# Patient Record
Sex: Female | Born: 1980 | Race: White | Hispanic: No | Marital: Single | Smoking: Former smoker
Health system: Southern US, Community
[De-identification: ages and names within clinical notes are randomized; demographics above are authoritative.]

## PROBLEM LIST (undated history)

## (undated) DIAGNOSIS — F419 Anxiety disorder, unspecified: Secondary | ICD-10-CM

## (undated) DIAGNOSIS — D649 Anemia, unspecified: Secondary | ICD-10-CM

## (undated) DIAGNOSIS — F32A Depression, unspecified: Secondary | ICD-10-CM

## (undated) DIAGNOSIS — F3181 Bipolar II disorder: Secondary | ICD-10-CM

## (undated) DIAGNOSIS — C801 Malignant (primary) neoplasm, unspecified: Secondary | ICD-10-CM

## (undated) DIAGNOSIS — R569 Unspecified convulsions: Secondary | ICD-10-CM

## (undated) DIAGNOSIS — F329 Major depressive disorder, single episode, unspecified: Secondary | ICD-10-CM

## (undated) DIAGNOSIS — Z8489 Family history of other specified conditions: Secondary | ICD-10-CM

## (undated) HISTORY — DX: Bipolar II disorder: F31.81

## (undated) HISTORY — PX: ARM WOUND REPAIR / CLOSURE: SUR1141

## (undated) HISTORY — DX: Major depressive disorder, single episode, unspecified: F32.9

## (undated) HISTORY — DX: Depression, unspecified: F32.A

## (undated) HISTORY — DX: Anxiety disorder, unspecified: F41.9

---

## 2006-02-27 ENCOUNTER — Emergency Department (HOSPITAL_COMMUNITY): Admission: EM | Admit: 2006-02-27 | Discharge: 2006-02-27 | Payer: Self-pay | Admitting: Emergency Medicine

## 2008-08-14 ENCOUNTER — Emergency Department (HOSPITAL_COMMUNITY): Admission: EM | Admit: 2008-08-14 | Discharge: 2008-08-14 | Payer: Self-pay | Admitting: Emergency Medicine

## 2010-03-21 ENCOUNTER — Encounter: Payer: Self-pay | Admitting: Emergency Medicine

## 2011-06-25 ENCOUNTER — Ambulatory Visit (INDEPENDENT_AMBULATORY_CARE_PROVIDER_SITE_OTHER): Payer: BC Managed Care – PPO | Admitting: Internal Medicine

## 2011-06-25 VITALS — BP 119/78 | HR 68 | Temp 98.5°F | Resp 18 | Ht 69.0 in | Wt 139.2 lb

## 2011-06-25 DIAGNOSIS — T783XXA Angioneurotic edema, initial encounter: Secondary | ICD-10-CM

## 2011-06-25 DIAGNOSIS — F419 Anxiety disorder, unspecified: Secondary | ICD-10-CM

## 2011-06-25 DIAGNOSIS — L509 Urticaria, unspecified: Secondary | ICD-10-CM

## 2011-06-25 DIAGNOSIS — F411 Generalized anxiety disorder: Secondary | ICD-10-CM

## 2011-06-25 MED ORDER — ALPRAZOLAM 0.5 MG PO TBDP: 0.5000 mg | ORAL_TABLET | Freq: Two times a day (BID) | ORAL | Status: AC | PRN

## 2011-06-25 MED ORDER — DIPHENHYDRAMINE HCL 25 MG PO CAPS
50.0000 mg | ORAL_CAPSULE | Freq: Once | ORAL | Status: AC
  Administered 2011-06-25: 50 mg via ORAL

## 2011-06-25 MED ORDER — CETIRIZINE HCL 10 MG PO TABS: 10.0000 mg | ORAL_TABLET | Freq: Every day | ORAL | Status: DC

## 2011-06-25 MED ORDER — METHYLPREDNISOLONE ACETATE 80 MG/ML IJ SUSP
80.0000 mg | Freq: Once | INTRAMUSCULAR | Status: AC
  Administered 2011-06-25: 80 mg via INTRAMUSCULAR

## 2011-06-25 NOTE — Patient Instructions (Signed)
Hives Hives (urticaria) are itchy, red, swollen patches on the skin. They may change size, shape, and location quickly and repeatedly. Hives that occur deeper in the skin can cause swelling of the hands, feet, and face. Hives may be an allergic reaction to something you or your child ate, touched, or put on the skin. Hives can also be a reaction to cold, heat, viral infections, medication, insect bites, or emotional stress. Often the cause is hard to find. Hives can come and go for several days to several weeks. Hives are not contagious. HOME CARE INSTRUCTIONS   If the cause of the hives is known, avoid exposure to that source.   To relieve itching and rash:   Apply cold compresses to the skin or take cool water baths. Do not take or give your child hot baths or showers because the warmth will make the itching worse.   The best medicine for hives is an antihistamine. An antihistamine will not cure hives, but it will reduce their severity. You can use an antihistamine available over the counter. This medicine may make your child sleepy. Teenagers should not drive while using this medicine.   Take or give an antihistamine every 6 hours until the hives are completely gone for 24 hours or as directed.   Your child may have other medications prescribed for itching. Give these as directed by your child's caregiver.   You or your child should wear loose fitting clothing, including undergarments. Skin irritations may make hives worse.   Follow-up as directed by your caregiver.  SEEK MEDICAL CARE IF:   You or your child still have considerable itching after taking the medication (prescribed or purchased over the counter).   Joint swelling or pain occurs.  SEEK IMMEDIATE MEDICAL CARE IF:   You have a fever.   Swollen lips or tongue are noticed.   There is difficulty with breathing, swallowing, or tightness in the throat or chest.   Abdominal pain develops.   Your child starts acting very  sick.  These may be the first signs of a life-threatening allergic reaction. THIS IS AN EMERGENCY. Call 911 for medical help. MAKE SURE YOU:   Understand these instructions.   Will watch your condition.   Will get help right away if you are not doing well or get worse.  Document Released: 02/13/2005 Document Revised: 02/02/2011 Document Reviewed: 10/04/2007 Exit[employer] Patient Information 2012 [employer]. 

## 2011-06-25 NOTE — Progress Notes (Signed)
  Subjective:    Patient ID: Haley Evans, female    DOB: 02/16/1981, 31 y.o.   MRN: [id number]  HPI Seen emergently. Has swelling of her left eyelid with itching and sneezing. Has chest titeness but probably her anxiety. No past hx of allergys, or asthma. No exposure hx.  Has had scatered iregular breakouts with focal hives for last 2 weeks all over and small areas only . No swelling of tongue, throat or airway   Review of Systems     Objective:   Physical Exam Swollen left eyelid Lungs clear  Heart RR 68 No hives anywhere else       Assessment & Plan:  Allergy and possible angioedema and hives  Benedryl 50mg  now Depomedrol 80mg  IM now  See meds

## 2011-12-11 ENCOUNTER — Other Ambulatory Visit: Payer: Self-pay | Admitting: Internal Medicine

## 2011-12-12 ENCOUNTER — Other Ambulatory Visit: Payer: Self-pay | Admitting: *Deleted

## 2012-03-02 ENCOUNTER — Other Ambulatory Visit: Payer: Self-pay | Admitting: Physician Assistant

## 2012-06-21 ENCOUNTER — Other Ambulatory Visit: Payer: Self-pay | Admitting: Physician Assistant

## 2012-06-25 ENCOUNTER — Telehealth: Payer: Self-pay

## 2012-06-25 NOTE — Telephone Encounter (Signed)
Pharm requests RF of alprazolam 0.5 mg BID prn anxiety.

## 2012-06-27 NOTE — Telephone Encounter (Signed)
Needs to establish with primary care

## 2014-06-03 ENCOUNTER — Ambulatory Visit (INDEPENDENT_AMBULATORY_CARE_PROVIDER_SITE_OTHER): Payer: BLUE CROSS/BLUE SHIELD | Admitting: Neurology

## 2014-06-03 ENCOUNTER — Encounter: Payer: Self-pay | Admitting: Neurology

## 2014-06-03 VITALS — BP 120/70 | HR 84 | Ht 69.0 in | Wt 154.0 lb

## 2014-06-03 DIAGNOSIS — R251 Tremor, unspecified: Secondary | ICD-10-CM | POA: Diagnosis not present

## 2014-06-03 DIAGNOSIS — R292 Abnormal reflex: Secondary | ICD-10-CM

## 2014-06-03 DIAGNOSIS — R5383 Other fatigue: Secondary | ICD-10-CM | POA: Diagnosis not present

## 2014-06-03 LAB — CBC WITH DIFFERENTIAL/PLATELET
BASOS ABS: 0 10*3/uL (ref 0.0–0.1)
Basophils Relative: 0 % (ref 0–1)
Eosinophils Absolute: 0.1 10*3/uL (ref 0.0–0.7)
Eosinophils Relative: 1 % (ref 0–5)
HCT: 42 % (ref 36.0–46.0)
Hemoglobin: 14.1 g/dL (ref 12.0–15.0)
LYMPHS PCT: 27 % (ref 12–46)
Lymphs Abs: 2.4 10*3/uL (ref 0.7–4.0)
MCH: 30.5 pg (ref 26.0–34.0)
MCHC: 33.6 g/dL (ref 30.0–36.0)
MCV: 90.7 fL (ref 78.0–100.0)
MPV: 10.1 fL (ref 8.6–12.4)
Monocytes Absolute: 0.9 10*3/uL (ref 0.1–1.0)
Monocytes Relative: 10 % (ref 3–12)
NEUTROS ABS: 5.6 10*3/uL (ref 1.7–7.7)
Neutrophils Relative %: 62 % (ref 43–77)
PLATELETS: 320 10*3/uL (ref 150–400)
RBC: 4.63 MIL/uL (ref 3.87–5.11)
RDW: 12.8 % (ref 11.5–15.5)
WBC: 9 10*3/uL (ref 4.0–10.5)

## 2014-06-03 LAB — COMPREHENSIVE METABOLIC PANEL
ALBUMIN: 4.4 g/dL (ref 3.5–5.2)
ALT: 20 U/L (ref 0–35)
AST: 18 U/L (ref 0–37)
Alkaline Phosphatase: 51 U/L (ref 39–117)
BUN: 16 mg/dL (ref 6–23)
CALCIUM: 9.3 mg/dL (ref 8.4–10.5)
CHLORIDE: 101 meq/L (ref 96–112)
CO2: 26 meq/L (ref 19–32)
Creat: 0.82 mg/dL (ref 0.50–1.10)
Glucose, Bld: 82 mg/dL (ref 70–99)
POTASSIUM: 4.3 meq/L (ref 3.5–5.3)
Sodium: 137 mEq/L (ref 135–145)
TOTAL PROTEIN: 7.2 g/dL (ref 6.0–8.3)
Total Bilirubin: 0.5 mg/dL (ref 0.2–1.2)

## 2014-06-03 LAB — TSH: TSH: 1.526 u[IU]/mL (ref 0.350–4.500)

## 2014-06-03 LAB — VITAMIN B12: Vitamin B-12: 571 pg/mL (ref 211–911)
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
## 2014-06-03 NOTE — Patient Instructions (Signed)
1. Your provider has requested that you have labwork completed today. Please go to [facility] on the first floor of this building before leaving the office today. 2. We have scheduled you at [facility] for your MRI on 06/19/2014 at 9:00 am. Please arrive 15 minutes prior and go to 1st floor radiology. If you need to reschedule for any reason please call [phone].

## 2014-06-04 NOTE — Progress Notes (Signed)
Note routed to Thurston Pounds, NP.

## 2014-06-05 LAB — CERULOPLASMIN: Ceruloplasmin: 23 mg/dL (ref 18–53)

## 2014-06-05 LAB — COPPER, SERUM: COPPER: 86 ug/dL (ref 70–175)

## 2014-06-09 ENCOUNTER — Ambulatory Visit: Payer: Self-pay | Admitting: Neurology

## 2014-06-17 ENCOUNTER — Telehealth: Payer: Self-pay | Admitting: *Deleted

## 2014-06-17 NOTE — Telephone Encounter (Signed)
Patient calling because her insurance denied her MRI please advise   Call [phone] number [phone]

## 2014-06-18 NOTE — Telephone Encounter (Signed)
Left message on machine for patient to call back.

## 2014-06-19 ENCOUNTER — Ambulatory Visit (HOSPITAL_COMMUNITY): Payer: BLUE CROSS/BLUE SHIELD

## 2018-01-16 ENCOUNTER — Emergency Department (HOSPITAL_BASED_OUTPATIENT_CLINIC_OR_DEPARTMENT_OTHER): Payer: BLUE CROSS/BLUE SHIELD

## 2018-01-16 ENCOUNTER — Emergency Department (HOSPITAL_BASED_OUTPATIENT_CLINIC_OR_DEPARTMENT_OTHER)
Admission: EM | Admit: 2018-01-16 | Discharge: 2018-01-16 | Disposition: A | Discharge status: Home / Self Care | Payer: BLUE CROSS/BLUE SHIELD | Attending: Emergency Medicine | Admitting: Emergency Medicine

## 2018-01-16 ENCOUNTER — Encounter: Payer: Self-pay | Admitting: Neurology

## 2018-01-16 ENCOUNTER — Other Ambulatory Visit: Payer: Self-pay

## 2018-01-16 ENCOUNTER — Encounter (HOSPITAL_BASED_OUTPATIENT_CLINIC_OR_DEPARTMENT_OTHER): Payer: Self-pay

## 2018-01-16 DIAGNOSIS — F17210 Nicotine dependence, cigarettes, uncomplicated: Secondary | ICD-10-CM | POA: Insufficient documentation

## 2018-01-16 DIAGNOSIS — Z79899 Other long term (current) drug therapy: Secondary | ICD-10-CM | POA: Insufficient documentation

## 2018-01-16 DIAGNOSIS — R51 Headache: Secondary | ICD-10-CM | POA: Diagnosis not present

## 2018-01-16 DIAGNOSIS — H53149 Visual discomfort, unspecified: Secondary | ICD-10-CM | POA: Insufficient documentation

## 2018-01-16 DIAGNOSIS — R42 Dizziness and giddiness: Secondary | ICD-10-CM | POA: Insufficient documentation

## 2018-01-16 DIAGNOSIS — M79602 Pain in left arm: Secondary | ICD-10-CM | POA: Diagnosis not present

## 2018-01-16 DIAGNOSIS — R569 Unspecified convulsions: Secondary | ICD-10-CM

## 2018-01-16 DIAGNOSIS — R55 Syncope and collapse: Secondary | ICD-10-CM | POA: Diagnosis present

## 2018-01-16 LAB — CBC WITH DIFFERENTIAL/PLATELET
ABS IMMATURE GRANULOCYTES: 0.02 10*3/uL (ref 0.00–0.07)
BASOS PCT: 1 %
Basophils Absolute: 0.1 10*3/uL (ref 0.0–0.1)
Eosinophils Absolute: 0.1 10*3/uL (ref 0.0–0.5)
Eosinophils Relative: 1 %
HCT: 42.8 % (ref 36.0–46.0)
HEMOGLOBIN: 13.8 g/dL (ref 12.0–15.0)
Immature Granulocytes: 0 %
LYMPHS PCT: 24 %
Lymphs Abs: 2.1 10*3/uL (ref 0.7–4.0)
MCH: 30.4 pg (ref 26.0–34.0)
MCHC: 32.2 g/dL (ref 30.0–36.0)
MCV: 94.3 fL (ref 80.0–100.0)
MONO ABS: 0.7 10*3/uL (ref 0.1–1.0)
MONOS PCT: 9 %
NEUTROS ABS: 5.7 10*3/uL (ref 1.7–7.7)
Neutrophils Relative %: 65 %
Platelets: 281 10*3/uL (ref 150–400)
RBC: 4.54 MIL/uL (ref 3.87–5.11)
RDW: 12 % (ref 11.5–15.5)
WBC: 8.6 10*3/uL (ref 4.0–10.5)
nRBC: 0 % (ref 0.0–0.2)

## 2018-01-16 LAB — BASIC METABOLIC PANEL
Anion gap: 8 (ref 5–15)
BUN: 9 mg/dL (ref 6–20)
CHLORIDE: 108 mmol/L (ref 98–111)
CO2: 23 mmol/L (ref 22–32)
CREATININE: 0.77 mg/dL (ref 0.44–1.00)
Calcium: 8.8 mg/dL — ABNORMAL LOW (ref 8.9–10.3)
GFR calc Af Amer: >60 mL/min (ref >60)
GFR calc non Af Amer: >60 mL/min (ref >60)
GLUCOSE: 103 mg/dL — AB (ref 70–99)
POTASSIUM: 3.3 mmol/L — AB (ref 3.5–5.1)
SODIUM: 139 mmol/L (ref 135–145)

## 2018-01-16 MED ORDER — PROCHLORPERAZINE EDISYLATE 10 MG/2ML IJ SOLN
10.0000 mg | Freq: Once | INTRAMUSCULAR | Status: AC
  Administered 2018-01-16: 10 mg via INTRAVENOUS
  Filled 2018-01-16: qty 2

## 2018-01-16 MED ORDER — DEXAMETHASONE 6 MG PO TABS
10.0000 mg | ORAL_TABLET | Freq: Once | ORAL | Status: AC
  Administered 2018-01-16: 10 mg via ORAL
  Filled 2018-01-16: qty 1

## 2018-01-16 MED ORDER — SODIUM CHLORIDE 0.9 % IV BOLUS
1000.0000 mL | Freq: Once | INTRAVENOUS | Status: AC
  Administered 2018-01-16: 1000 mL via INTRAVENOUS

## 2018-01-16 MED ORDER — DIPHENHYDRAMINE HCL 50 MG/ML IJ SOLN
25.0000 mg | Freq: Once | INTRAMUSCULAR | Status: AC
  Administered 2018-01-16: 25 mg via INTRAVENOUS
  Filled 2018-01-16: qty 1

## 2018-01-16 NOTE — ED Notes (Signed)
Patient transported to CT 

## 2018-01-16 NOTE — ED Provider Notes (Signed)
[facility] EMERGENCY DEPARTMENT Provider Note   CSN: [id number] Arrival date & time: 01/16/18  1130     History   Chief Complaint Chief Complaint  Patient presents with  . Loss of Consciousness    HPI Haley Evans is a 37 y.o. female.  37 yo F with a chief complaint of a syncopal event versus a seizure.  This happened 2 days ago.  Patient was making dinner she was putting bread on a griddle and she suddenly felt hot like she might pass out and then she woke up on the ground.  She was confused about what exactly happened and had lost control of her bladder.  Her boyfriend was there with her and caught her before she fell.  He said that she had some shaking.  She does not how long this occurred.  No history of seizures she denies head injury.  She is now complaining of headache that worse the top of her head, with photophobia.  No histories of headaches like this before.  She has been having some left arm pain as well.  She denies neck pain.  Denies trauma to the arm.  Denies unilateral weakness or numbness denies difficulty with speech or swallowing.  She has been somewhat confused for the past couple days.  Usually worse with bright lights.  She has had a couple more episodes where she felt she might pass out but resolved prior to her losing consciousness.  The history is provided by the patient.  Loss of Consciousness   This is a new problem. The current episode started 2 days ago. The problem occurs rarely. The problem has been resolved. She lost consciousness for a period of less than one minute. The problem is associated with normal activity. Associated symptoms include dizziness, headaches and nausea. Pertinent negatives include chest pain, congestion, fever, palpitations, visual change and vomiting.    Past Medical History:  Diagnosis Date  . Anxiety   . Depression     Patient Active Problem List   Diagnosis Date Noted  . Anxiety 06/25/2011    Past  Surgical History:  Procedure Laterality Date  . ARM WOUND REPAIR / CLOSURE Right    fractured arm  . CESAREAN SECTION       OB History   None      Home Medications    Prior to Admission medications   Medication Sig Start Date End Date Taking? Authorizing Provider  ALPRAZolam Duanne Moron) 0.5 MG tablet TAKE 1 TABLET BY MOUTH TWICE DAILY AS NEEDED FOR ANXIETY 03/02/12   Argentina Donovan, PA-C  Ascorbic Acid (VITAMIN C PO) Take by mouth daily.    [provider]  B Complex-C (SUPER B COMPLEX PO) Take by mouth daily.    [provider]  BIOTIN PO Take by mouth daily.    [provider]  buPROPion (WELLBUTRIN SR) 150 MG 12 hr tablet Take 150 mg by mouth 2 (two) times daily.    [provider]  Cholecalciferol (VITAMIN D PO) Take by mouth daily.    [provider]  CINNAMON PO Take by mouth daily.    [provider]  COCONUT OIL PO Take by mouth daily.    [provider]  cyclobenzaprine (FLEXERIL) 10 MG tablet Take 10 mg by mouth as needed for muscle spasms.    [provider]  escitalopram (LEXAPRO) 20 MG tablet Take 20 mg by mouth daily.    [provider]  Multiple Vitamin (MULTIVITAMIN) tablet  Take 1 tablet by mouth daily.    [provider]  VITAMIN E PO Take by mouth daily.    [provider]    Family History No family history on file.  Social History Social History   Tobacco Use  . Smoking status: Current Every Day Smoker    Types: Cigarettes  . Smokeless tobacco: Never Used  Substance Use Topics  . Alcohol use: Yes    Comment: weekly  . Drug use: No     Allergies   Morphine   Review of Systems Review of Systems  Constitutional: Positive for fatigue. Negative for chills and fever.  HENT: Negative for congestion and rhinorrhea.   Eyes: Negative for redness and visual disturbance.  Respiratory: Negative for shortness of breath and wheezing.   Cardiovascular: Positive  for syncope. Negative for chest pain and palpitations.  Gastrointestinal: Positive for nausea. Negative for vomiting.  Genitourinary: Negative for dysuria and urgency.  Musculoskeletal: Positive for arthralgias and myalgias.  Skin: Negative for pallor and wound.  Neurological: Positive for dizziness, syncope and headaches.       Confusion      Physical Exam Updated Vital Signs BP (!) 139/99 (BP Location: Right Arm)   Pulse 86   Temp 98.3 F (36.8 C) (Oral)   Resp 20   Ht 5\' 10"  (1.778 m)   Wt 73.5 kg   SpO2 100%   BMI 23.24 kg/m   Physical Exam  Constitutional: She is oriented to person, place, and time. She appears well-developed and well-nourished. No distress.  HENT:  Head: Normocephalic and atraumatic.  Eyes: Pupils are equal, round, and reactive to light. EOM are normal.  Neck: Normal range of motion. Neck supple.  Cardiovascular: Normal rate and regular rhythm. Exam reveals no gallop and no friction rub.  No murmur heard. Pulmonary/Chest: Effort normal. She has no wheezes. She has no rales.  Abdominal: Soft. She exhibits no distension. There is no tenderness.  Musculoskeletal: She exhibits no edema or tenderness.  Neurological: She is alert and oriented to person, place, and time. She has normal strength. No cranial nerve deficit or sensory deficit. She displays a negative Romberg sign. Coordination and gait normal. GCS eye subscore is 4. GCS verbal subscore is 5. GCS motor subscore is 6. She displays no Babinski's sign on the right side. She displays no Babinski's sign on the left side.  Reflex Scores:      Tricep reflexes are 2+ on the right side and 2+ on the left side.      Bicep reflexes are 2+ on the right side and 2+ on the left side.      Brachioradialis reflexes are 2+ on the right side and 2+ on the left side.      Patellar reflexes are 2+ on the right side and 2+ on the left side.      Achilles reflexes are 2+ on the right side and 2+ on the left  side. Benign neurologic exam  Skin: Skin is warm and dry. She is not diaphoretic.  Psychiatric: She has a normal mood and affect. Her behavior is normal.  Nursing note and vitals reviewed.    ED Treatments / Results  Labs (all labs ordered are listed, but only abnormal results are displayed) Labs Reviewed  BASIC METABOLIC PANEL - Abnormal; Notable for the following components:      Result Value   Potassium 3.3 (*)    Glucose, Bld 103 (*)    Calcium 8.8 (*)  All other components within normal limits  CBC WITH DIFFERENTIAL/PLATELET  PREGNANCY, URINE    EKG EKG Interpretation  Date/Time:  Wednesday January 16 2018 11:39:42 EST Ventricular Rate:  86 PR Interval:  142 QRS Duration: 82 QT Interval:  352 QTC Calculation: 421 R Axis:   64 Text Interpretation:  Normal sinus rhythm with sinus arrhythmia Normal ECG No old tracing to compare no wpw, prolonged qt or brugada Confirmed by Deno Etienne [phone]) on 01/16/2018 11:46:56 AM   Radiology Ct Head Wo Contrast  Result Date: 01/16/2018 CLINICAL DATA:  Possible seizure 2 days ago, reported loss of consciousness. Headache. Normal neurologic exam. EXAM: CT HEAD WITHOUT CONTRAST TECHNIQUE: Contiguous axial images were obtained from the base of the skull through the vertex without intravenous contrast. COMPARISON:  CT head 07/08/2005. 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 Compared with the previous CT scan 12 years ago however, the overall AP dimension of the CSF collection is measurably unchanged and is therefore of uncertain significance with respect to headache and seizures. Otherwise, no acute stroke, hemorrhage, mass lesion, or hydrocephalus. Normal cerebral volume. No white disease. Within limits for assessment on CT, normal  appearing temporal lobes. Vascular: No hyperdense vessel or unexpected calcification. Skull: Normal. Negative for fracture or focal lesion. Sinuses/Orbits: No acute finding. Other: None. IMPRESSION: Prominent retrocerebellar cisterna magna, unchanged from 2007. Otherwise negative study. No acute intracranial findings. Electronically Signed   By: Staci Righter M.D.   On: 01/16/2018 12:48    Procedures Procedures (including critical care time)  Medications Ordered in ED Medications  sodium chloride 0.9 % bolus 1,000 mL (1,000 mLs Intravenous New Bag/Given 01/16/18 1239)  prochlorperazine (COMPAZINE) injection 10 mg (10 mg Intravenous Given 01/16/18 1245)  diphenhydrAMINE (BENADRYL) injection 25 mg (25 mg Intravenous Given 01/16/18 1240)  dexamethasone (DECADRON) tablet 10 mg (10 mg Oral Given 01/16/18 1245)     Initial Impression / Assessment and Plan / ED Course  I have reviewed the triage vital signs and the nursing notes.  Pertinent labs & imaging results that were available during my care of the patient were reviewed by me and considered in my medical decision making (see chart for details).     37 yo F with a chief complaint of loss of consciousness.  This happened a couple days ago.  She did not seek medical care because she thought it might go away.  Having persistent headaches since.  Nontraumatic.  Will obtain a CT of the head, labs give a headache cocktail and reassess.  Difficult to say whether or not this was a seizure versus syncope though sounds more like a vasovagal syncope with prodrome.  Otherwise we will start her on seizure precautions, give her neuro follow-up  The patient has a chronically dilated cisterna magna, I discussed the imaging with Dr. Cheral Marker, neurology he felt that this was a chronic finding that the patient likely needed an outpatient MRI and EEG.  We will give neurology follow-up.  1:31 PM:  I have discussed the diagnosis/risks/treatment options with the  patient and believe the pt to be eligible for discharge home to follow-up with PCP, neuro. We also discussed returning to the ED immediately if new or worsening sx occur. We discussed the sx which are most concerning (e.g., recurrent event, worsening headache, neck pain, fever) that necessitate  immediate return. Medications administered to the patient during their visit and any new prescriptions provided to the patient are listed below.  Medications given during this visit Medications  sodium chloride 0.9 % bolus 1,000 mL (1,000 mLs Intravenous New Bag/Given 01/16/18 1239)  prochlorperazine (COMPAZINE) injection 10 mg (10 mg Intravenous Given 01/16/18 1245)  diphenhydrAMINE (BENADRYL) injection 25 mg (25 mg Intravenous Given 01/16/18 1240)  dexamethasone (DECADRON) tablet 10 mg (10 mg Oral Given 01/16/18 1245)      The patient appears reasonably screen and/or stabilized for discharge and I doubt any other medical condition or other [facility] requiring further screening, evaluation, or treatment in the ED at this time prior to discharge.    Final Clinical Impressions(s) / ED Diagnoses   Final diagnoses:  Syncope and collapse  Seizure-like activity [facility])    ED Discharge Orders         Ordered    Ambulatory referral to Neurology    Comments:  Syncope vs seizure   01/16/18 [place], [place], DO 01/16/18 1331

## 2018-01-16 NOTE — ED Triage Notes (Addendum)
Pt states she passed out 2 days ago in her kitchen while cooking-pt states she has had a HA, photosensitivity left arm pain, "have to focus extra hard-confused" since LOC-pt has not sought medical care until now-pt entered triage crying, anxious, fast paced speech, hyperventilating-was encouraged to to try and calm self and take slow deep breaths-pt drove self to ED

## 2018-01-16 NOTE — Discharge Instructions (Signed)
Follow up with your family doc and a neurologist.  Return for repeat event.    No driving, swimming, bathing by yourself, climbing to tall heights until cleared by your PCP or neurologist.

## 2018-01-28 ENCOUNTER — Ambulatory Visit: Payer: BLUE CROSS/BLUE SHIELD | Admitting: Neurology

## 2018-01-28 ENCOUNTER — Other Ambulatory Visit: Payer: Self-pay

## 2018-01-28 ENCOUNTER — Encounter: Payer: Self-pay | Admitting: Neurology

## 2018-01-28 VITALS — BP 108/78 | HR 68 | Ht 69.0 in | Wt 168.0 lb

## 2018-01-28 DIAGNOSIS — R292 Abnormal reflex: Secondary | ICD-10-CM

## 2018-01-28 DIAGNOSIS — R55 Syncope and collapse: Secondary | ICD-10-CM

## 2018-01-28 DIAGNOSIS — R569 Unspecified convulsions: Secondary | ICD-10-CM
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
## 2018-01-28 NOTE — Patient Instructions (Addendum)
1. Schedule MRI brain with and without contrast 2. Schedule MRI cervical spine with and without contrast   We have sent a referral to [facility] for your MRI's and they will call you directly to schedule your appt. They are located at [address]. If you need to contact them directly please call [phone].   3. Schedule 1-hour EEG, if normal, we will plan for a 24-hour EEG 4. Schedule echocardiogram 5. Follow-up after tests, call for any changes

## 2018-01-30 ENCOUNTER — Ambulatory Visit (INDEPENDENT_AMBULATORY_CARE_PROVIDER_SITE_OTHER): Payer: BLUE CROSS/BLUE SHIELD | Admitting: Neurology

## 2018-01-30 DIAGNOSIS — R55 Syncope and collapse: Secondary | ICD-10-CM | POA: Diagnosis not present

## 2018-01-30 DIAGNOSIS — R292 Abnormal reflex: Secondary | ICD-10-CM

## 2018-01-30 DIAGNOSIS — R569 Unspecified convulsions: Secondary | ICD-10-CM

## 2018-02-01 ENCOUNTER — Encounter: Payer: Self-pay | Admitting: Neurology

## 2018-02-04 NOTE — Procedures (Signed)
ELECTROENCEPHALOGRAM REPORT  Date of Study: 01/30/2018  Patient's Name: Haley Evans MRN: [id number] Date of Birth: 07/31/1980  Referring Provider: Dr. Ellouise Newer  Clinical History: This is a 37 year old woman with an episode of loss of consciousness with convulsion, post-event confusion.   Medications: LAMICTAL 200 MG tablet l XANAX 0.5 MG tablet  VITAMIN C PO  SUPER B COMPLEX PO  ATARAX/VISTARIL 25 MG tablet  MIRENA, 52 MG, 20 MCG/24HR IUD  MULTIVITAMIN tablet  VALTREX 1000 MG tablet  VITAMIN E PO   Technical Summary: A multichannel digital 1-hour EEG recording measured by the international 10-20 system with electrodes applied with paste and impedances below 5000 ohms performed in our laboratory with EKG monitoring in an awake and drowsy patient.  Hyperventilation and photic stimulation were performed.  The digital EEG was referentially recorded, reformatted, and digitally filtered in a variety of bipolar and referential montages for optimal display.    Description: The patient is awake and drowsy during the recording.  During maximal wakefulness, there is a symmetric, medium voltage 11 Hz posterior dominant rhythm that attenuates with eye opening.  The record is symmetric.  During drowsiness, there is an increase in theta slowing of the background and central beta activity. Deeper stages of sleep were not seen. Hyperventilation and photic stimulation did not elicit any abnormalities.  There were no epileptiform discharges or electrographic seizures seen.    EKG lead was unremarkable.  Impression: This 1-hour awake and asleep EEG is normal.    Clinical Correlation: A normal EEG does not exclude a clinical diagnosis of epilepsy.  If further clinical questions remain, prolonged EEG may be helpful.  Clinical correlation is advised.   Ellouise Newer, M.D.

## 2018-02-05 ENCOUNTER — Telehealth: Payer: Self-pay

## 2018-02-05 NOTE — Telephone Encounter (Signed)
New message    Just an FYI. We have made several attempts to contact this patient including sending a letter to schedule or reschedule their echocardiogram. We will be removing the patient from the echo WQ.   Thank you 

## 2018-02-14 ENCOUNTER — Telehealth: Payer: Self-pay

## 2018-02-14 NOTE — Telephone Encounter (Signed)
LMOM asking for return call to the office.  Did not relay message below.

## 2018-02-14 NOTE — Telephone Encounter (Signed)
-----   Message from Cameron Sprang, MD sent at 02/04/2018  9:52 PM EST ----- Pls let her know the EEG was normal, proceed with 24-hour EEG as discussed, thanks!

## 2018-02-14 NOTE — Telephone Encounter (Signed)
I relayed info below and was going to schedule EEG but patient said that she thought it was discussed that if EEG normal then no 24-hr EEG needed to be done. Please call her back at [phone]. Thanks!

## 2021-01-24 ENCOUNTER — Other Ambulatory Visit: Payer: Self-pay | Admitting: Obstetrics and Gynecology

## 2021-01-24 DIAGNOSIS — R928 Other abnormal and inconclusive findings on diagnostic imaging of breast: Secondary | ICD-10-CM

## 2021-02-25 ENCOUNTER — Ambulatory Visit
Admission: RE | Admit: 2021-02-25 | Discharge: 2021-02-25 | Disposition: A | Discharge status: Home / Self Care | Payer: BC Managed Care – PPO | Source: Ambulatory Visit | Attending: Obstetrics and Gynecology | Admitting: Obstetrics and Gynecology

## 2021-02-25 ENCOUNTER — Ambulatory Visit
Admission: RE | Admit: 2021-02-25 | Discharge: 2021-02-25 | Disposition: A | Discharge status: Home / Self Care | Payer: BLUE CROSS/BLUE SHIELD | Source: Ambulatory Visit | Attending: Obstetrics and Gynecology | Admitting: Obstetrics and Gynecology

## 2021-02-25 ENCOUNTER — Other Ambulatory Visit: Payer: Self-pay | Admitting: Obstetrics and Gynecology

## 2021-02-25 DIAGNOSIS — N6320 Unspecified lump in the left breast, unspecified quadrant: Secondary | ICD-10-CM

## 2021-02-25 DIAGNOSIS — R921 Mammographic calcification found on diagnostic imaging of breast: Secondary | ICD-10-CM

## 2021-02-25 DIAGNOSIS — R928 Other abnormal and inconclusive findings on diagnostic imaging of breast: Secondary | ICD-10-CM

## 2021-03-01 ENCOUNTER — Ambulatory Visit
Admission: RE | Admit: 2021-03-01 | Discharge: 2021-03-01 | Disposition: A | Discharge status: Home / Self Care | Payer: BC Managed Care – PPO | Source: Ambulatory Visit | Attending: Obstetrics and Gynecology | Admitting: Obstetrics and Gynecology

## 2021-03-01 DIAGNOSIS — N6320 Unspecified lump in the left breast, unspecified quadrant: Secondary | ICD-10-CM

## 2021-03-01 DIAGNOSIS — R921 Mammographic calcification found on diagnostic imaging of breast: Secondary | ICD-10-CM

## 2021-03-03 ENCOUNTER — Telehealth: Payer: Self-pay | Admitting: Hematology and Oncology

## 2021-03-03 NOTE — Telephone Encounter (Signed)
Spoke to patient to confirm afternoon clinic appointment on 1/11, packet sent via e-mail

## 2021-03-07 ENCOUNTER — Encounter: Payer: Self-pay | Admitting: *Deleted

## 2021-03-07 DIAGNOSIS — C50412 Malignant neoplasm of upper-outer quadrant of left female breast: Secondary | ICD-10-CM

## 2021-03-07 DIAGNOSIS — Z170 Estrogen receptor positive status [ER+]: Secondary | ICD-10-CM | POA: Insufficient documentation

## 2021-03-08 NOTE — Progress Notes (Signed)
Radiation Oncology         [phone] ________________________________  Multidisciplinary Breast Oncology Clinic [facility]) Initial Outpatient Consultation  Name: Haley Evans MRN: [id number]  Date: 03/09/2021  DOB: 1980/07/30  CX:[id number], Sherrill Raring, NP  Jovita Kussmaul, MD   REFERRING PHYSICIAN: Autumn Messing III, MD  DIAGNOSIS: The encounter diagnosis was Malignant neoplasm of upper-outer quadrant of left breast in female, estrogen receptor positive ([place]).  Stage IA (cT1c, cN0, cM0) Left Breast UOQ, Invasive Ductal Carcinoma, ER+ / PR+ / Her2-, Grade 2    ICD-10-CM   1. Malignant neoplasm of upper-outer quadrant of left breast in female, estrogen receptor positive ([place])  C50.412    Z17.0       HISTORY OF PRESENT ILLNESS::Haley Evans is a 41 y.o. female who is presenting to the office today for evaluation of her newly diagnosed breast cancer. She is accompanied by her female partner and mother. She is doing well overall.   She had routine screening mammography on 01/12/21 showing a possible asymmetry and  microcalcifications in the right breast. Screening mammogram also showed a possible left axillary mass, 2 asymmetries, and a group of macrocalcifications in the left breast . She underwent bilateral diagnostic mammography with tomography and left and right breast ultrasonography at [facility] on 02/25/21 showing: a highly suspicious mass in the left axillary tail at 2 o'clock measuring 1.5 cm, an indeterminate cystic mass in the retroareolar 1 o'clock left breast measuring 1.3 cm, and indeterminate grouped coarse heterogeneous calcifications spanning 0.8 cm in the upper outer left breast.  Left breast 1:00 biopsy on 03/01/20 showed fibrocystic changes with acellular debris. UO left breast biopsy showed columnar cells and fibrocystic changes with calcifications. Left breast 2:00 biopsy showed grade 2 invasive ductal carcinoma measuring 1.2 cm in the greatest linear extent.  Prognostic indicators significant for: estrogen receptor, 95% positive and progesterone receptor, 95% positive, both with strong staining intensity. Proliferation marker Ki67 at 1%. HER2 negative.  Menarche: 41 years old Age at first live birth: 41 years old GP: 2 LMP: Has IUD, no last date indicated on the provided form Contraceptive: currently has IUD HRT: never used   The patient was referred today for presentation in the multidisciplinary conference.  Radiology studies and pathology slides were presented there for review and discussion of treatment options.  A consensus was discussed regarding potential next steps.  PREVIOUS RADIATION THERAPY: No  PAST MEDICAL HISTORY:  Past Medical History:  Diagnosis Date   Anxiety    Bipolar 2 disorder ([place])    Depression     PAST SURGICAL HISTORY: Past Surgical History:  Procedure Laterality Date   ARM WOUND REPAIR / CLOSURE Right    fractured arm   CESAREAN SECTION      FAMILY HISTORY: No family history on file.  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  Tobacco Use   Smoking status: Every Day    Types: Cigarettes   Smokeless tobacco: Never  Vaping Use   Vaping Use: Never used  Substance and Sexual Activity   Alcohol use: Yes    Comment: weekly   Drug use: No   Sexual activity: Not on file  Other Topics Concern  Not on file  Social History Narrative   Not on file   Social Determinants of Health   Financial Resource Strain: Not on file  Food Insecurity: Not on file  Transportation Needs: Not on file  Physical Activity: Not on file  Stress: Not on file  Social Connections: Not on file    ALLERGIES:  Allergies  Allergen Reactions   Morphine Itching     MEDICATIONS:  Current Outpatient Medications  Medication Sig Dispense Refill   ALPRAZolam (XANAX) 0.5 MG tablet TAKE 1 TABLET BY MOUTH TWICE DAILY AS NEEDED FOR ANXIETY 10 tablet 0   Ascorbic Acid (VITAMIN C PO) Take by mouth daily.     B Complex-C (SUPER B COMPLEX PO) Take by mouth daily.     hydrOXYzine (ATARAX/VISTARIL) 25 MG tablet Take 25 mg by mouth at bedtime.     lamoTRIgine (LAMICTAL) 200 MG tablet lamotrigine 200 mg tablet (Patient not taking: Reported on 03/09/2021)     levonorgestrel (MIRENA, 52 MG,) 20 MCG/24HR IUD Mirena 20 mcg/24 hours (5 yrs) 52 mg intrauterine device  Take 1 device by intrauterine route.     Multiple Vitamin (MULTIVITAMIN) tablet Take 1 tablet by mouth daily.     valACYclovir (VALTREX) 1000 MG tablet valacyclovir 1 gram tablet  TK 2 TS PO Q 12 H FOR 2 DOSES PRF OUTBREAK (Patient not taking: Reported on 03/09/2021)     VITAMIN E PO Take by mouth daily.     No current facility-administered medications for this encounter.    REVIEW OF SYSTEMS: A 10+ POINT REVIEW OF SYSTEMS WAS OBTAINED including neurology, dermatology, psychiatry, cardiac, respiratory, lymph, extremities, GI, GU, musculoskeletal, constitutional, reproductive, HEENT. On the provided form, she reports chills, night sweats, sleeping with 3 pillows, palpable breast lump, abnormal moles, back pain, seizures, anxiety, depression, and fainting. She denies any other symptoms.    PHYSICAL EXAM:    Vitals with BMI 03/09/2021  Height _0   Weight 155 lbs 8 oz  BMI 78.58  Systolic 850  Diastolic 67  Pulse 90   Lungs are clear to auscultation bilaterally. Heart has regular rate and rhythm. No palpable cervical, supraclavicular, or axillary adenopathy. Abdomen soft, non-tender, normal bowel sounds. Breast: Right breast with no palpable mass, nipple discharge, or bleeding. Left breast with biopsy site and some bruising in the UOQ, she is very tender with palpation from biopsies.   KPS = 100  100  - Normal; no complaints; no evidence of disease. 90   - Able to carry on normal activity; minor signs or symptoms of disease. 80   - Normal activity with effort; some signs or symptoms of disease. 20   - Cares for self; unable to carry on normal activity or to do active work. 60   - Requires occasional assistance, but is able to care for most of his personal needs. 50   - Requires considerable assistance and frequent medical care. 15   - Disabled; requires special care and assistance. 86   - Severely disabled; hospital admission is indicated although death not imminent. 41   - Very sick; hospital admission necessary; active supportive treatment necessary. 10   - Moribund; fatal processes progressing rapidly. 0     - Dead  Karnofsky DA, Abelmann WH, Craver LS and Burchenal [facility] [phone]) The use of the nitrogen mustards in the palliative treatment of carcinoma: with particular reference to bronchogenic carcinoma Cancer 1 634-56  LABORATORY DATA:  Lab Results  Component Value Date   WBC 7.4  03/09/2021   HGB 13.1 03/09/2021   HCT 39.8 03/09/2021   MCV 92.8 03/09/2021   PLT 344 03/09/2021   Lab Results  Component Value Date   NA 140 03/09/2021   K 3.7 03/09/2021   CL 105 03/09/2021   CO2 29 03/09/2021   Lab Results  Component Value Date   ALT 27 03/09/2021   AST 19 03/09/2021   ALKPHOS 50 03/09/2021   BILITOT 0.4 03/09/2021    PULMONARY FUNCTION TEST:   Recent Review Flowsheet Data   There is no flowsheet data to display.     RADIOGRAPHY: US BREAST LTD UNI LEFT INC AXILLA  Result Date: 02/25/2021 CLINICAL DATA:  41 year old female presenting as a recall from baseline screening for multiple bilateral findings. EXAM: DIGITAL DIAGNOSTIC BILATERAL MAMMOGRAM WITH TOMOSYNTHESIS AND CAD; ULTRASOUND RIGHT BREAST LIMITED; ULTRASOUND LEFT BREAST LIMITED TECHNIQUE: Bilateral digital diagnostic mammography and breast tomosynthesis was performed. The images were evaluated with computer-aided  detection.; Targeted ultrasound examination of the right breast was performed; Targeted ultrasound examination of the left breast was performed. COMPARISON:  Previous exam(s). ACR Breast Density Category c: The breast tissue is heterogeneously dense, which may obscure small masses. FINDINGS: Mammogram: Right breast: Spot compression tomosynthesis and spot 2D magnification views of the right breast were performed for a questioned asymmetry seen only on MLO view in the superior right breast as well as calcifications centrally in the right breast. On the additional imaging the asymmetry is less conspicuous though a small obscured mass may be present. The calcifications in the central right breast are loosely grouped and demonstrate layering on lateral view, consistent with benign milk of calcium. There are no suspicious linear or branching forms. Left breast: Spot 2D magnification views, spot compression tomosynthesis views and full field mL tomosynthesis views of the left breast were performed. There is persistence of an irregular spiculated mass in the axillary tail measuring 1.4 cm. The questioned small asymmetry in the superior left breast is less conspicuous and may represent normal tissue. In there is persistence of a group of coarse heterogeneous calcifications in the upper outer left breast spanning 0.8 cm. There is persistence of a round circumscribed mass in the retroareolar aspect of the breast measuring approximately 0.9 cm. On physical exam of the left axilla I feel a fixed discrete mass. Ultrasound: Right breast: Targeted ultrasound performed throughout the superior aspect of the right breast demonstrating scattered benign cysts and fibrocystic changes. No suspicious solid mass is identified. Targeted ultrasound the right axilla demonstrates normal lymph nodes. Left breast: Targeted ultrasound performed in the left axillary tail at 2 o'clock 10 cm from the nipple demonstrating an irregular hypoechoic mass  measuring 1.5 x 1.0 x 1.1 cm. This corresponds to the mammographic finding. Targeted ultrasound performed in the retroareolar 1 o'clock left breast demonstrates a circumscribed cystic mass with internal echoes measuring 1.3 x 0.8 x 1.1 cm. There is associated vascularity. Targeted ultrasound performed throughout the superior aspect of the left breast demonstrates no additional cystic or solid mass to correspond to the asymmetry identified mammographically. Targeted ultrasound of the left axilla demonstrates normal lymph nodes. IMPRESSION: 1. Highly suspicious mass in the left axillary tail at 2 o'clock measuring 1.5 cm. 2. Indeterminate cystic mass in the retroareolar 1 o'clock left breast measuring 1.3 cm. 3. Indeterminate grouped coarse heterogeneous calcifications spanning 0.8 cm in the upper outer left breast. 4. No mammographic or sonographic evidence of malignancy in the right breast. RECOMMENDATION: 1. Ultrasound-guided core needle biopsy of the left  axillary tail mass. 2. Ultrasound-guided core needle biopsy of the mass in the retroareolar 1 o'clock left breast. 3. Stereotactic core needle biopsy of the calcifications in the upper outer left breast. 4. Following completion of the biopsies recommend patient undergo breast MRI given heterogeneity of the breast tissue and multiple findings. I have discussed the findings and recommendations with the patient who agrees to proceed with biopsy. The patient will be scheduled for the biopsy appointment prior to leaving the office today. BI-RADS CATEGORY  5: Highly suggestive of malignancy. Electronically Signed   By: Audie Pinto M.D.   On: 02/25/2021 15:32   US BREAST LTD UNI RIGHT INC AXILLA  Result Date: 02/25/2021 CLINICAL DATA:  41 year old female presenting as a recall from baseline screening for multiple bilateral findings. EXAM: DIGITAL DIAGNOSTIC BILATERAL MAMMOGRAM WITH TOMOSYNTHESIS AND CAD; ULTRASOUND RIGHT BREAST LIMITED; ULTRASOUND LEFT BREAST  LIMITED TECHNIQUE: Bilateral digital diagnostic mammography and breast tomosynthesis was performed. The images were evaluated with computer-aided detection.; Targeted ultrasound examination of the right breast was performed; Targeted ultrasound examination of the left breast was performed. COMPARISON:  Previous exam(s). ACR Breast Density Category c: The breast tissue is heterogeneously dense, which may obscure small masses. FINDINGS: Mammogram: Right breast: Spot compression tomosynthesis and spot 2D magnification views of the right breast were performed for a questioned asymmetry seen only on MLO view in the superior right breast as well as calcifications centrally in the right breast. On the additional imaging the asymmetry is less conspicuous though a small obscured mass may be present. The calcifications in the central right breast are loosely grouped and demonstrate layering on lateral view, consistent with benign milk of calcium. There are no suspicious linear or branching forms. Left breast: Spot 2D magnification views, spot compression tomosynthesis views and full field mL tomosynthesis views of the left breast were performed. There is persistence of an irregular spiculated mass in the axillary tail measuring 1.4 cm. The questioned small asymmetry in the superior left breast is less conspicuous and may represent normal tissue. In there is persistence of a group of coarse heterogeneous calcifications in the upper outer left breast spanning 0.8 cm. There is persistence of a round circumscribed mass in the retroareolar aspect of the breast measuring approximately 0.9 cm. On physical exam of the left axilla I feel a fixed discrete mass. Ultrasound: Right breast: Targeted ultrasound performed throughout the superior aspect of the right breast demonstrating scattered benign cysts and fibrocystic changes. No suspicious solid mass is identified. Targeted ultrasound the right axilla demonstrates normal lymph nodes.  Left breast: Targeted ultrasound performed in the left axillary tail at 2 o'clock 10 cm from the nipple demonstrating an irregular hypoechoic mass measuring 1.5 x 1.0 x 1.1 cm. This corresponds to the mammographic finding. Targeted ultrasound performed in the retroareolar 1 o'clock left breast demonstrates a circumscribed cystic mass with internal echoes measuring 1.3 x 0.8 x 1.1 cm. There is associated vascularity. Targeted ultrasound performed throughout the superior aspect of the left breast demonstrates no additional cystic or solid mass to correspond to the asymmetry identified mammographically. Targeted ultrasound of the left axilla demonstrates normal lymph nodes. IMPRESSION: 1. Highly suspicious mass in the left axillary tail at 2 o'clock measuring 1.5 cm. 2. Indeterminate cystic mass in the retroareolar 1 o'clock left breast measuring 1.3 cm. 3. Indeterminate grouped coarse heterogeneous calcifications spanning 0.8 cm in the upper outer left breast. 4. No mammographic or sonographic evidence of malignancy in the right breast. RECOMMENDATION: 1. Ultrasound-guided core needle  biopsy of the left axillary tail mass. 2. Ultrasound-guided core needle biopsy of the mass in the retroareolar 1 o'clock left breast. 3. Stereotactic core needle biopsy of the calcifications in the upper outer left breast. 4. Following completion of the biopsies recommend patient undergo breast MRI given heterogeneity of the breast tissue and multiple findings. I have discussed the findings and recommendations with the patient who agrees to proceed with biopsy. The patient will be scheduled for the biopsy appointment prior to leaving the office today. BI-RADS CATEGORY  5: Highly suggestive of malignancy. Electronically Signed   By: Audie Pinto M.D.   On: 02/25/2021 15:32   MM DIAG BREAST TOMO BILATERAL  Result Date: 02/25/2021 CLINICAL DATA:  41 year old female presenting as a recall from baseline screening for multiple  bilateral findings. EXAM: DIGITAL DIAGNOSTIC BILATERAL MAMMOGRAM WITH TOMOSYNTHESIS AND CAD; ULTRASOUND RIGHT BREAST LIMITED; ULTRASOUND LEFT BREAST LIMITED TECHNIQUE: Bilateral digital diagnostic mammography and breast tomosynthesis was performed. The images were evaluated with computer-aided detection.; Targeted ultrasound examination of the right breast was performed; Targeted ultrasound examination of the left breast was performed. COMPARISON:  Previous exam(s). ACR Breast Density Category c: The breast tissue is heterogeneously dense, which may obscure small masses. FINDINGS: Mammogram: Right breast: Spot compression tomosynthesis and spot 2D magnification views of the right breast were performed for a questioned asymmetry seen only on MLO view in the superior right breast as well as calcifications centrally in the right breast. On the additional imaging the asymmetry is less conspicuous though a small obscured mass may be present. The calcifications in the central right breast are loosely grouped and demonstrate layering on lateral view, consistent with benign milk of calcium. There are no suspicious linear or branching forms. Left breast: Spot 2D magnification views, spot compression tomosynthesis views and full field mL tomosynthesis views of the left breast were performed. There is persistence of an irregular spiculated mass in the axillary tail measuring 1.4 cm. The questioned small asymmetry in the superior left breast is less conspicuous and may represent normal tissue. In there is persistence of a group of coarse heterogeneous calcifications in the upper outer left breast spanning 0.8 cm. There is persistence of a round circumscribed mass in the retroareolar aspect of the breast measuring approximately 0.9 cm. On physical exam of the left axilla I feel a fixed discrete mass. Ultrasound: Right breast: Targeted ultrasound performed throughout the superior aspect of the right breast demonstrating scattered  benign cysts and fibrocystic changes. No suspicious solid mass is identified. Targeted ultrasound the right axilla demonstrates normal lymph nodes. Left breast: Targeted ultrasound performed in the left axillary tail at 2 o'clock 10 cm from the nipple demonstrating an irregular hypoechoic mass measuring 1.5 x 1.0 x 1.1 cm. This corresponds to the mammographic finding. Targeted ultrasound performed in the retroareolar 1 o'clock left breast demonstrates a circumscribed cystic mass with internal echoes measuring 1.3 x 0.8 x 1.1 cm. There is associated vascularity. Targeted ultrasound performed throughout the superior aspect of the left breast demonstrates no additional cystic or solid mass to correspond to the asymmetry identified mammographically. Targeted ultrasound of the left axilla demonstrates normal lymph nodes. IMPRESSION: 1. Highly suspicious mass in the left axillary tail at 2 o'clock measuring 1.5 cm. 2. Indeterminate cystic mass in the retroareolar 1 o'clock left breast measuring 1.3 cm. 3. Indeterminate grouped coarse heterogeneous calcifications spanning 0.8 cm in the upper outer left breast. 4. No mammographic or sonographic evidence of malignancy in the right breast. RECOMMENDATION: 1. Ultrasound-guided  core needle biopsy of the left axillary tail mass. 2. Ultrasound-guided core needle biopsy of the mass in the retroareolar 1 o'clock left breast. 3. Stereotactic core needle biopsy of the calcifications in the upper outer left breast. 4. Following completion of the biopsies recommend patient undergo breast MRI given heterogeneity of the breast tissue and multiple findings. I have discussed the findings and recommendations with the patient who agrees to proceed with biopsy. The patient will be scheduled for the biopsy appointment prior to leaving the office today. BI-RADS CATEGORY  5: Highly suggestive of malignancy. Electronically Signed   By: Audie Pinto M.D.   On: 02/25/2021 15:32  MM CLIP  PLACEMENT LEFT  Result Date: 03/01/2021 CLINICAL DATA:  Evaluate biopsy markers EXAM: 3D DIAGNOSTIC LEFT MAMMOGRAM POST ULTRASOUND AND STEREOTACTIC BIOPSIES COMPARISON:  Previous exam(s). FINDINGS: 3D Mammographic images were obtained following ultrasound and stereotactic guided biopsies of the left breast. Coil shaped clip is within the biopsied 2 o'clock left breast mass. The X shaped clip is at the site of the biopsied upper outer quadrant calcifications. The ribbon shaped clip is within the biopsied 1 o'clock left breast mass. IMPRESSION: All 3 clips are in good position as detailed above. Final Assessment: Post Procedure Mammograms for Marker Placement Electronically Signed   By: Dorise Bullion III M.D.   On: 03/01/2021 15:20  MM LT BREAST BX W LOC DEV 1ST LESION IMAGE BX SPEC STEREO GUIDE  Addendum Date: 03/03/2021   ADDENDUM REPORT: 03/03/2021 10:02 ADDENDUM: Pathology revealed FIBROCYSTIC CHANGES WITH ACELLULAR DEBRIS- NO MALIGNANCY IDENTIFIED of the LEFT breast, 1 o'clock (ribbon clip). This was found to be concordant by Dr. Dorise Bullion. 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 This was found to be concordant by Dr. Dorise Bullion. Pathology revealed COLUMNAR CELL AND FIBROCYSTIC CHANGES WITH CALCIFICATIONS- NO MALIGNANCY IDENTIFIED of the LEFT breast, upper outer (X clip). This was found to be concordant by Dr. Dorise Bullion. Pathology results were discussed with the patient by telephone. The patient reported doing well after the biopsies with tenderness at the sites. Post biopsy instructions and care were reviewed and questions were answered. The patient was encouraged to call [facility] for any additional concerns. Recommend breast MRI given heterogeneity of the breast tissue and multiple findings. The patient was referred to [facility]  on March 09, 2021. Pathology results reported by Stacie Acres RN on 03/02/2021. Electronically Signed   By: Dorise Bullion III M.D.   On: 03/03/2021 10:02   Result Date: 03/03/2021 CLINICAL DATA:  Biopsy of left breast calcifications EXAM: LEFT BREAST STEREOTACTIC CORE NEEDLE BIOPSY COMPARISON:  Previous exams. FINDINGS: The patient and I discussed the procedure of stereotactic-guided biopsy including benefits and alternatives. We discussed the high likelihood of a successful procedure. We discussed the risks of the procedure including infection, bleeding, tissue injury, clip migration, and inadequate sampling. Informed written consent was given. The usual time out protocol was performed immediately prior to the procedure. Using sterile technique and 1% Lidocaine as local anesthetic, under stereotactic guidance, a 9 gauge vacuum assisted device was used to perform core needle biopsy of left breast calcifications using a superior approach. Specimen radiograph was performed showing calcifications in several core specimens. Specimens with calcifications are identified for pathology. Lesion quadrant: Upper outer quadrant At the conclusion of the procedure, an X shaped tissue marker clip was deployed into the  biopsy cavity. Follow-up 2-view mammogram was performed and dictated separately. IMPRESSION: Stereotactic-guided biopsy of left breast calcifications. No apparent complications. Electronically Signed: By: Dorise Bullion III M.D. On: 03/01/2021 15:11  [place] LT BREAST BX W LOC DEV 1ST LESION IMG BX SPEC US GUIDE  Addendum Date: 03/03/2021   ADDENDUM REPORT: 03/03/2021 10:03 ADDENDUM: Pathology revealed FIBROCYSTIC CHANGES WITH ACELLULAR DEBRIS- NO MALIGNANCY IDENTIFIED of the LEFT breast, 1 o'clock (ribbon clip). This was found to be concordant by Dr. Dorise Bullion. 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 This was found to be concordant by Dr. Dorise Bullion.  Pathology revealed COLUMNAR CELL AND FIBROCYSTIC CHANGES WITH CALCIFICATIONS- NO MALIGNANCY IDENTIFIED of the LEFT breast, upper outer (X clip). This was found to be concordant by Dr. Dorise Bullion. Pathology results were discussed with the patient by telephone. The patient reported doing well after the biopsies with tenderness at the sites. Post biopsy instructions and care were reviewed and questions were answered. The patient was encouraged to call [facility] for any additional concerns. Recommend breast MRI given heterogeneity of the breast tissue and multiple findings. The patient was referred to [facility] on March 09, 2021. Pathology results reported by Stacie Acres RN on 03/02/2021. Electronically Signed   By: Dorise Bullion III M.D.   On: 03/03/2021 10:03   Result Date: 03/03/2021 CLINICAL DATA:  Biopsy of a 1 o'clock left breast mass. Biopsy of a 2 o'clock left breast mass. EXAM: ULTRASOUND GUIDED LEFT BREAST CORE NEEDLE BIOPSY COMPARISON:  Previous exam(s). PROCEDURE: I met with the patient and we discussed the procedure of ultrasound-guided biopsy, including benefits and alternatives. We discussed the high likelihood of a successful procedure. We discussed the risks of the procedure, including infection, bleeding, tissue injury, clip migration, and inadequate sampling. Informed written consent was given. The usual time-out protocol was performed immediately prior to the procedure. Lesion quadrant: 1 o'clock left breast Using sterile technique and 1% Lidocaine as local anesthetic, under direct ultrasound visualization, a 12 gauge spring-loaded device was used to perform biopsy of a 1 o'clock left breast mass using a lateral approach. At the conclusion of the procedure a ribbon shaped tissue marker clip was deployed into the biopsy cavity. Follow up 2 view mammogram was performed and dictated  separately. Lesion quadrant: 2 o'clock left breast Using sterile technique and 1% Lidocaine as local anesthetic, under direct ultrasound visualization, a 12 gauge spring-loaded device was used to perform biopsy of a 2 o'clock left breast mass using a lateral approach. At the conclusion of the procedure coil shaped tissue marker clip was deployed into the biopsy cavity. Follow up 2 view mammogram was performed and dictated separately. IMPRESSION: Ultrasound guided biopsy of 1 o'clock and 2 o'clock left breast masses as above. No apparent complications. Electronically Signed: By: Dorise Bullion III M.D. On: 03/01/2021 15:09  [place] LT BREAST BX W LOC DEV EA ADD LESION IMG BX SPEC US GUIDE  Addendum Date: 03/03/2021   ADDENDUM REPORT: 03/03/2021 10:03 ADDENDUM: Pathology revealed FIBROCYSTIC CHANGES WITH ACELLULAR DEBRIS- NO MALIGNANCY IDENTIFIED of the LEFT breast, 1 o'clock (ribbon clip). This was found to be concordant by Dr. Dorise Bullion. 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 This was found to be concordant by Dr. Dorise Bullion. Pathology revealed COLUMNAR CELL AND FIBROCYSTIC CHANGES WITH CALCIFICATIONS- NO MALIGNANCY IDENTIFIED  of the LEFT breast, upper outer (X clip). This was found to be concordant by Dr. Dorise Bullion. Pathology results were discussed with the patient by telephone. The patient reported doing well after the biopsies with tenderness at the sites. Post biopsy instructions and care were reviewed and questions were answered. The patient was encouraged to call [facility] for any additional concerns. Recommend breast MRI given heterogeneity of the breast tissue and multiple findings. The patient was referred to [facility] on March 09, 2021. Pathology results reported by Stacie Acres RN on 03/02/2021. Electronically Signed   By: Dorise Bullion III M.D.   On: 03/03/2021 10:03   Result Date: 03/03/2021 CLINICAL DATA:  Biopsy of a 1 o'clock left breast mass. Biopsy of a 2 o'clock left breast mass. EXAM: ULTRASOUND GUIDED LEFT BREAST CORE NEEDLE BIOPSY COMPARISON:  Previous exam(s). PROCEDURE: I met with the patient and we discussed the procedure of ultrasound-guided biopsy, including benefits and alternatives. We discussed the high likelihood of a successful procedure. We discussed the risks of the procedure, including infection, bleeding, tissue injury, clip migration, and inadequate sampling. Informed written consent was given. The usual time-out protocol was performed immediately prior to the procedure. Lesion quadrant: 1 o'clock left breast Using sterile technique and 1% Lidocaine as local anesthetic, under direct ultrasound visualization, a 12 gauge spring-loaded device was used to perform biopsy of a 1 o'clock left breast mass using a lateral approach. At the conclusion of the procedure a ribbon shaped tissue marker clip was deployed into the biopsy cavity. Follow up 2 view mammogram was performed and dictated separately. Lesion quadrant: 2 o'clock left breast Using sterile technique and 1% Lidocaine as local anesthetic, under direct ultrasound visualization, a 12 gauge spring-loaded device was used to perform biopsy of a 2 o'clock left breast mass using a lateral approach. At the conclusion of the procedure coil shaped tissue marker clip was deployed into the biopsy cavity. Follow up 2 view mammogram was performed and dictated separately. IMPRESSION: Ultrasound guided biopsy of 1 o'clock and 2 o'clock left breast masses as above. No apparent complications. Electronically Signed: By: Dorise Bullion III M.D. On: 03/01/2021 15:09     IMPRESSION: Stage IA (cT1c, cN0, cM0) Left Breast UOQ, Invasive Ductal Carcinoma, ER+ / PR+ / Her2-, Grade 2   Patient will be a good candidate for breast conservation with radiotherapy to the left breast. We  discussed the general course of radiation, potential side effects, and toxicities with radiation and the patient is interested in this approach.    PLAN:  MRI Genetics  Left lumpectomy and SNL Oncotype  Adjuvant radiation therapy  Aromatase inhibitor     ------------------------------------------------  Blair Promise, PhD, MD  This document serves as a record of services personally performed by Gery Pray, MD. It was created on his behalf by Roney Mans, a trained medical scribe. The creation of this record is based on the scribe's personal observations and the provider's statements to them. This document has been checked and approved by the attending provider.

## 2021-03-09 ENCOUNTER — Other Ambulatory Visit: Payer: Self-pay

## 2021-03-09 ENCOUNTER — Ambulatory Visit: Payer: BC Managed Care – PPO | Attending: General Surgery | Admitting: Physical Therapy

## 2021-03-09 ENCOUNTER — Encounter: Payer: Self-pay | Admitting: Physical Therapy

## 2021-03-09 ENCOUNTER — Encounter: Payer: Self-pay | Admitting: *Deleted

## 2021-03-09 ENCOUNTER — Inpatient Hospital Stay: Payer: BC Managed Care – PPO | Attending: Hematology and Oncology

## 2021-03-09 ENCOUNTER — Inpatient Hospital Stay: Payer: BC Managed Care – PPO | Admitting: Hematology and Oncology

## 2021-03-09 ENCOUNTER — Other Ambulatory Visit: Payer: Self-pay | Admitting: *Deleted

## 2021-03-09 ENCOUNTER — Ambulatory Visit (HOSPITAL_BASED_OUTPATIENT_CLINIC_OR_DEPARTMENT_OTHER): Payer: BC Managed Care – PPO | Admitting: Genetic Counselor

## 2021-03-09 ENCOUNTER — Ambulatory Visit
Admission: RE | Admit: 2021-03-09 | Discharge: 2021-03-09 | Disposition: A | Discharge status: Home / Self Care | Payer: BC Managed Care – PPO | Source: Ambulatory Visit | Attending: Radiation Oncology | Admitting: Radiation Oncology

## 2021-03-09 DIAGNOSIS — F17210 Nicotine dependence, cigarettes, uncomplicated: Secondary | ICD-10-CM | POA: Diagnosis not present

## 2021-03-09 DIAGNOSIS — Z170 Estrogen receptor positive status [ER+]: Secondary | ICD-10-CM

## 2021-03-09 DIAGNOSIS — Z800 Family history of malignant neoplasm of digestive organs: Secondary | ICD-10-CM

## 2021-03-09 DIAGNOSIS — C50412 Malignant neoplasm of upper-outer quadrant of left female breast: Secondary | ICD-10-CM

## 2021-03-09 DIAGNOSIS — R293 Abnormal posture: Secondary | ICD-10-CM | POA: Insufficient documentation

## 2021-03-09 LAB — CBC WITH DIFFERENTIAL (CANCER CENTER ONLY)
Abs Immature Granulocytes: 0.01 10*3/uL (ref 0.00–0.07)
Basophils Absolute: 0 10*3/uL (ref 0.0–0.1)
Basophils Relative: 1 %
Eosinophils Absolute: 0.1 10*3/uL (ref 0.0–0.5)
Eosinophils Relative: 2 %
HCT: 39.8 % (ref 36.0–46.0)
Hemoglobin: 13.1 g/dL (ref 12.0–15.0)
Immature Granulocytes: 0 %
Lymphocytes Relative: 25 %
Lymphs Abs: 1.8 10*3/uL (ref 0.7–4.0)
MCH: 30.5 pg (ref 26.0–34.0)
MCHC: 32.9 g/dL (ref 30.0–36.0)
MCV: 92.8 fL (ref 80.0–100.0)
Monocytes Absolute: 0.5 10*3/uL (ref 0.1–1.0)
Monocytes Relative: 7 %
Neutro Abs: 4.9 10*3/uL (ref 1.7–7.7)
Neutrophils Relative %: 65 %
Platelet Count: 344 10*3/uL (ref 150–400)
RBC: 4.29 MIL/uL (ref 3.87–5.11)
RDW: 12.6 % (ref 11.5–15.5)
WBC Count: 7.4 10*3/uL (ref 4.0–10.5)
nRBC: 0 % (ref 0.0–0.2)

## 2021-03-09 LAB — CMP (CANCER CENTER ONLY)
ALT: 27 U/L (ref 0–44)
AST: 19 U/L (ref 15–41)
Albumin: 4 g/dL (ref 3.5–5.0)
Alkaline Phosphatase: 50 U/L (ref 38–126)
Anion gap: 6 (ref 5–15)
BUN: 8 mg/dL (ref 6–20)
CO2: 29 mmol/L (ref 22–32)
Calcium: 9.1 mg/dL (ref 8.9–10.3)
Chloride: 105 mmol/L (ref 98–111)
Creatinine: 0.89 mg/dL (ref 0.44–1.00)
GFR, Estimated: >60 mL/min (ref >60)
Glucose, Bld: 97 mg/dL (ref 70–99)
Potassium: 3.7 mmol/L (ref 3.5–5.1)
Sodium: 140 mmol/L (ref 135–145)
Total Bilirubin: 0.4 mg/dL (ref 0.3–1.2)
Total Protein: 6.8 g/dL (ref 6.5–8.1)

## 2021-03-09 LAB — GENETIC SCREENING ORDER

## 2021-03-09 NOTE — Therapy (Signed)
OUTPATIENT PHYSICAL THERAPY BREAST CANCER BASELINE EVALUATION   Patient Name: Haley Evans MRN: [id number] DOB:02-02-1981, 41 y.o., female Today's Date: 03/09/2021   PT End of Session - 03/09/21 1639     Visit Number 1    Number of Visits 2    Date for PT Re-Evaluation 05/04/21    PT Start Time 1326    PT Stop Time 1337   Also saw pt from [phone] for a total of 26 min   PT Time Calculation (min) 11 min    Activity Tolerance Patient tolerated treatment well    Behavior During Therapy [employer] for tasks assessed/performed             Past Medical History:  Diagnosis Date   Anxiety    Bipolar 2 disorder ([place])    Depression    Past Surgical History:  Procedure Laterality Date   ARM WOUND REPAIR / CLOSURE Right    fractured arm   CESAREAN SECTION     Patient Active Problem List   Diagnosis Date Noted   Malignant neoplasm of upper-outer quadrant of left breast in female, estrogen receptor positive ([place]) 03/07/2021   Anxiety 06/25/2011    PCP: Berkley Harvey, NP  REFERRING PROVIDER: Jovita Kussmaul, MD  REFERRING DIAG: Left breast cancer  THERAPY DIAG:  Malignant neoplasm of upper-outer quadrant of left breast in female, estrogen receptor positive ([place])  Abnormal posture  ONSET DATE: 01/12/2021  SUBJECTIVE                                                                                                                                                                                           SUBJECTIVE STATEMENT: Patient reports she is here today to be seen by her medical team for her newly diagnosed left breast cancer.   PERTINENT HISTORY:  Patient was diagnosed on 01/12/2021 with left grade II invasive ductal carcinoma breast cancer. It measures 1.5 cm with 8 mm of calcifications and is located in the upper outer quadrant. It is ER/PR positive and HER2 negative with a Ki67 of 1%.   PATIENT GOALS   reduce lymphedema risk and learn post op HEP.   PAIN:  Are  you having pain? Yes NPRS scale: 5/10 Pain location: left upper arm and breast Pain orientation: Left  PAIN TYPE: burning Pain description: intermittent  Aggravating factors: unknown Relieving factors: unknown  PRECAUTIONS: Active CA  WEIGHT BEARING RESTRICTIONS No  FALLS:  Has patient fallen in last 6 months? No, Number of falls: 0  LIVING ENVIRONMENT: Patient lives with: boyfriend Lives in: House/apartment Has following equipment at home: None  OCCUPATION: [employer]; full  time; works from home  LEISURE: She exercises 2x/week for 30 min doing HIIT  PRIOR LEVEL OF FUNCTION: Independent   OBJECTIVE  COGNITION:  Overall cognitive status: Within functional limits for tasks assessed    POSTURE:  Forward head and rounded shoulders posture  UPPER EXTREMITY AROM/PROM:  A/PROM Right 03/09/2021 Left 03/09/2021  Shoulder extension 36 33  Shoulder flexion 149 146  Shoulder abduction 161 159  Shoulder internal rotation 85 74  Shoulder external rotation 89 88    (Blank rows = not tested)    CERVICAL AROM: All within normal limits    UPPER EXTREMITY STRENGTH: L-DEX LYMPHEDEMA SCREENING Measurement Type: Unilateral L-DEX MEASUREMENT EXTREMITY: Upper Extremity POSITION : Standing DOMINANT SIDE: Right At Risk Side: Left BASELINE SCORE (UNILATERAL): -0.7    LYMPHEDEMA ASSESSMENTS:   LANDMARK RIGHT 03/09/2021 LEFT 03/09/2021  10 cm proximal to olecranon process 28.4 27.9  Olecranon process 25 24.7  10 cm proximal to ulnar styloid process 21.3 20.5  Just proximal to ulnar styloid process 15.8 16  Across hand at thumb web space 20 19.3  At base of 2nd digit 6.6 6.5  (Blank rows = not tested)   L-DEX LYMPHEDEMA SCREENING:  The patient was assessed using the L-Dex machine today to produce a lymphedema index baseline score. The patient will be reassessed on a regular basis (typically every 3 months) to obtain new L-Dex scores. If the score is > 6.5 points away  from his/her baseline score indicating onset of subclinical lymphedema, it will be recommended to wear a compression garment for 4 weeks, 12 hours per day and then be reassessed. If the score continues to be > 6.5 points from baseline at reassessment, we will initiate lymphedema treatment. Assessing in this manner has a 95% rate of preventing clinically significant lymphedema.  L-DEX LYMPHEDEMA SCREENING Measurement Type: Unilateral L-DEX MEASUREMENT EXTREMITY: Upper Extremity POSITION : Standing DOMINANT SIDE: Right At Risk Side: Left BASELINE SCORE (UNILATERAL): -0.7   QUICK DASH SURVEY:  Katina Dung - 03/09/21 0001     Open a tight or new jar Mild difficulty    Do heavy household chores (wash walls, wash floors) No difficulty    Carry a shopping bag or briefcase No difficulty    Wash your back No difficulty    Use a knife to cut food No difficulty    Recreational activities in which you take some force or impact through your arm, shoulder, or hand (golf, hammering, tennis) No difficulty    During the past week, to what extent has your arm, shoulder or hand problem interfered with your normal social activities with family, friends, neighbors, or groups? Quite a bit    During the past week, to what extent has your arm, shoulder or hand problem limited your work or other regular daily activities [place]    Arm, shoulder, or hand pain. Moderate    Tingling (pins and needles) in your arm, shoulder, or hand Mild    Difficulty Sleeping So much difficulty, I can't sleep    DASH Score 29.55 %              PATIENT EDUCATION:  Education details: Lymphedema risk reduction and post op shoulder/posture HEP Person educated: Patient Education method: Explanation, Demonstration, Handout Education comprehension: Patient verbalized understanding and returned demonstration   HOME EXERCISE PROGRAM: Patient was instructed today in a home exercise program today for post op shoulder range of  motion. These included active assist shoulder flexion in sitting, scapular retraction, wall walking  with shoulder abduction, and hands behind head external rotation.  She was encouraged to do these twice a day, holding 3 seconds and repeating 5 times when permitted by her physician.   ASSESSMENT:  CLINICAL IMPRESSION: Her multidisciplinary medical team met prior to her assessments to determine a recommended treatment plan. She is planning to have a left lumpectomy and sentinel node biopsy followed by Oncotype testing, radiation, and anti-estrogen therapy. She will benefit from a post op PT reassessment to determine needs and from L-Dex screens every 3 months for 2 years to detect subclinical lymphedema.  Pt will benefit from skilled therapeutic intervention to improve on the following deficits: Decreased knowledge of precautions, impaired UE functional use, pain, decreased ROM, postural dysfunction.   PT treatment/interventions: ADL/self-care home management, pt/family education, therapeutic exercise  REHAB POTENTIAL: Excellent  CLINICAL DECISION MAKING: Stable/uncomplicated  EVALUATION COMPLEXITY: Low   GOALS: Goals reviewed with patient? YES  LONG TERM GOALS: (STG=LTG)   Name Target Date Goal status  1 Pt will be able to verbalize understanding of pertinent lymphedema risk reduction practices relevant to her dx specifically related to skin care.  Baseline:  No knowledge 03/09/2021 Achieved at eval  2 Pt will be able to return demo and/or verbalize understanding of the post op HEP related to regaining shoulder ROM. Baseline:  No knowledge 03/09/2021 Achieved at eval  3 Pt will be able to verbalize understanding of the importance of attending the post op After Breast CA Class for further lymphedema risk reduction education and therapeutic exercise.  Baseline:  No knowledge 03/09/2021 Achieved at eval  4 Pt will demo she has regained full shoulder ROM and function post operatively compared  to baselines.  Baseline: See objective measurements taken today. 05/04/2021      PLAN: PT FREQUENCY/DURATION: EVAL and 1 follow up appointment.   PLAN FOR NEXT SESSION: will reassess 3-4 weeks post op to determine needs.   Patient will follow up at outpatient cancer rehab 3-4 weeks following surgery.  If the patient requires physical therapy at that time, a specific plan will be dictated and sent to the referring physician for approval. The patient was educated today on appropriate basic range of motion exercises to begin post operatively and the importance of attending the After Breast Cancer class following surgery.  Patient was educated today on lymphedema risk reduction practices as it pertains to recommendations that will benefit the patient immediately following surgery.  She verbalized good understanding.    Physical Therapy Information for After Breast Cancer Surgery/Treatment:  Lymphedema is a swelling condition that you may be at risk for in your arm if you have lymph nodes removed from the armpit area.  After a sentinel node biopsy, the risk is approximately 5-9% and is higher after an axillary node dissection.  There is treatment available for this condition and it is not life-threatening.  Contact your physician or physical therapist with concerns. You may begin the 4 shoulder/posture exercises (see additional sheet) when permitted by your physician (typically a week after surgery).  If you have drains, you may need to wait until those are removed before beginning range of motion exercises.  A general recommendation is to not lift your arms above shoulder height until drains are removed.  These exercises should be done to your tolerance and gently.  This is not a "no pain/no gain" type of recovery so listen to your body and stretch into the range of motion that you can tolerate, stopping if you have pain.  you are having immediate reconstruction, ask your plastic surgeon about doing  exercises as he or she may want you to wait. °We encourage you to attend the free one time ABC (After Breast Cancer) class offered by [facility].  You will learn information related to lymphedema risk, prevention and treatment and additional exercises to regain mobility following surgery.  You can call [phone] for more information.  This is offered the 1st and 3rd Monday of each month.  You only attend the class one time. °While undergoing any medical procedure or treatment, try to avoid blood pressure being taken or needle sticks from occurring on the arm on the side of cancer.   This recommendation begins after surgery and continues for the rest of your life.  This may help reduce your risk of getting lymphedema (swelling in your arm). °An excellent resource for those seeking information on lymphedema is the National Lymphedema Network's web site. It can be accessed at www.lymphnet.org °If you notice swelling in your hand, arm or breast at any time following surgery (even if it is many years from now), please contact your doctor or physical therapist to discuss this.  Lymphedema can be treated at any time but it is easier for you if it is treated early on.  If you feel like your shoulder motion is not returning to normal in a reasonable amount of time, please contact your surgeon or physical therapist. ° °Marti C. Smith, PT, CLT [phone]; [address] [place], [place] [address] °ABC CLASS °After Breast Cancer Class ° °After Breast Cancer Class is a specially designed exercise class to assist you in a safe recover after having breast cancer surgery.  In this class you will learn how to get back to full function whether your drains were just removed or if you had surgery a month ago. ° °This one-time class is held the 1st and 3rd Monday of every month from 11:00 a.m. until 12:00 noon at the [facility] located at [address] [place], [place]  [address] ° °This class is FREE and space is limited. For more information or to register for the next available class, call [phone]. ° °Class Goals ° °Understand specific stretches to improve the flexibility of you chest and shoulder. °Learn ways to safely strengthen your upper body and improve your posture. °Understand the warning signs of infection and why you may be at risk for an arm infection. °Learn about Lymphedema and prevention. ° °** You do not attend this class until after surgery.  Drains must be removed to participate ° °Patient was instructed today in a home exercise program today for post op shoulder range of motion. These included active assist shoulder flexion in sitting, scapular retraction, wall walking with shoulder abduction, and hands behind head external rotation.  She was encouraged to do these twice a day, holding 3 seconds and repeating 5 times when permitted by her physician. ° ° °Marti Cooper Smith, PT °03/09/21 4:49 PM ° ° ° °

## 2021-03-09 NOTE — Progress Notes (Signed)
[place] NOTE  Patient Care Team: Berkley Harvey, NP as PCP - General (Nurse Practitioner) Mauro Kaufmann, RN as Oncology Nurse Navigator Rockwell Germany, RN as Oncology Nurse Navigator Jovita Kussmaul, MD as Consulting Physician (General Surgery) Nicholas Lose, MD as Consulting Physician (Hematology and Oncology) Gery Pray, MD as Consulting Physician (Radiation Oncology)  CHIEF COMPLAINTS/PURPOSE OF CONSULTATION:  Newly diagnosed breast cancer  HISTORY OF PRESENTING ILLNESS:  Haley Evans 41 y.o. female is here because of recent diagnosis of left breast cancer.  Patient had a screening mammogram that detected multiple changes in the left breast.  There were calcifications which on biopsy were benign measuring 0.8 cm, there was a left breast asymmetry which was unclear and did not go away on repeat evaluation.  There was a left breast mass in the posteriorly at the axillary tail measuring 1.5 cm.  In the right breast there was a cyst.  Axilla was negative.  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  Patient was presented this morning to the multidisciplinary tumor board and she is here today to discuss her treatment plan.  I reviewed her records extensively and collaborated the history with the patient.  SUMMARY OF ONCOLOGIC HISTORY: Oncology History  Malignant neoplasm of upper-outer quadrant of left breast in female, estrogen receptor positive ([place])  03/01/2021 Initial Diagnosis   Screening mammogram detected left breast mass posteriorly at the axillary tail measuring 1.5 cm: Biopsy: Grade 2 IDC ER 95%, PR 95%, HER2 negative, Ki-67 1%, left breast calcifications 0.8 cm biopsy: Benign, left breast asymmetry?  Axilla negative   03/09/2021 Cancer Staging   Staging form: Breast, AJCC 8th Edition - Clinical stage from 03/09/2021: Stage IA (cT1c, cN0, cM0, G2, ER+, PR+, HER2-) -  Signed by Nicholas Lose, MD on 03/09/2021 Stage prefix: Initial diagnosis Histologic grading system: 3 grade system       MEDICAL HISTORY:  Past Medical History:  Diagnosis Date   Anxiety    Bipolar 2 disorder ([place])    Depression     SURGICAL HISTORY: Past Surgical History:  Procedure Laterality Date   ARM WOUND REPAIR / CLOSURE Right    fractured arm   CESAREAN SECTION      SOCIAL HISTORY: Social History   Socioeconomic History   Marital status: Single    Spouse name: Not on file   Number of children: Not on file   Years of education: Not on file   Highest education level: Not on file  Occupational History   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  Tobacco Use   Smoking status: Every Day    Types: Cigarettes   Smokeless tobacco: Never  Vaping Use   Vaping Use: Never used  Substance and Sexual Activity   Alcohol use: Yes    Comment: weekly   Drug use: No   Sexual activity: Not on file  Other Topics Concern   Not on file  Social History Narrative   Not on file   Social Determinants of Health   Financial Resource Strain: Not on file  Food Insecurity: Not on file  Transportation Needs: Not on file  Physical Activity: Not on file  Stress: Not on file  Social Connections: Not on file  Intimate Partner Violence: Not on file    FAMILY HISTORY: No family history on file.  ALLERGIES:  is allergic to morphine.  MEDICATIONS:  Current Outpatient Medications  Medication Sig Dispense Refill   ALPRAZolam (XANAX) 0.5 MG tablet TAKE 1 TABLET BY MOUTH TWICE DAILY AS NEEDED FOR ANXIETY 10 tablet 0   Ascorbic Acid (VITAMIN C PO) Take by mouth daily.     B Complex-C (SUPER B COMPLEX PO) Take by mouth daily.     hydrOXYzine (ATARAX/VISTARIL) 25 MG tablet Take 25 mg by mouth at bedtime.     lamoTRIgine (LAMICTAL) 200 MG tablet lamotrigine 200 mg tablet (Patient not taking: Reported on 03/09/2021)     levonorgestrel (MIRENA, 52 MG,) 20 MCG/24HR IUD  Mirena 20 mcg/24 hours (5 yrs) 52 mg intrauterine device  Take 1 device by intrauterine route.     Multiple Vitamin (MULTIVITAMIN) tablet Take 1 tablet by mouth daily.     valACYclovir (VALTREX) 1000 MG tablet valacyclovir 1 gram tablet  TK 2 TS PO Q 12 H FOR 2 DOSES PRF OUTBREAK (Patient not taking: Reported on 03/09/2021)     VITAMIN E PO Take by mouth daily.     No current facility-administered medications for this visit.    REVIEW OF SYSTEMS:   Constitutional: Denies fevers, chills or abnormal night sweats Eyes: Denies blurriness of vision, double vision or watery eyes Ears, nose, mouth, throat, and face: Denies mucositis or sore throat Respiratory: Denies cough, dyspnea or wheezes Cardiovascular: Denies palpitation, chest discomfort or lower extremity swelling Gastrointestinal:  Denies nausea, heartburn or change in bowel habits Skin: Denies abnormal skin rashes Lymphatics: Denies new lymphadenopathy or easy bruising Neurological:Denies numbness, tingling or new weaknesses Behavioral/Psych: Mood is stable, no new changes  Breast:  Denies any palpable lumps or discharge All other systems were reviewed with the patient and are negative.  PHYSICAL EXAMINATION: ECOG PERFORMANCE STATUS: 0 - Asymptomatic  Vitals:   03/09/21 1308  BP: 123/67  Pulse: 90  Resp: 18  Temp: 97.7 F (36.5 C)  SpO2: 99%   Filed Weights   03/09/21 1308  Weight: 155 lb 8 oz (70.5 kg)       LABORATORY DATA:  I have reviewed the data as listed Lab Results  Component Value Date   WBC 7.4 03/09/2021   HGB 13.1 03/09/2021   HCT 39.8 03/09/2021   MCV 92.8 03/09/2021   PLT 344 03/09/2021   Lab Results  Component Value Date   NA 140 03/09/2021   K 3.7 03/09/2021   CL 105 03/09/2021   CO2 29 03/09/2021    RADIOGRAPHIC STUDIES: I have personally reviewed the radiological reports and agreed with the findings in the report.  ASSESSMENT AND PLAN:  Malignant neoplasm of upper-outer quadrant of  left breast in female, estrogen receptor positive ([place]) 03/01/2021: Screening mammogram detected left breast mass posteriorly at the axillary tail measuring 1.5 cm: Biopsy: Grade 2 IDC ER 95%, PR 95%, HER2 negative, Ki-67 1%, left breast calcifications 0.8 cm biopsy: Benign, left breast asymmetry?  Axilla negative  Pathology and radiology counseling:Discussed with the patient, the details of pathology including the type of breast cancer,the clinical staging, the significance of ER, PR and HER-2/neu receptors and the implications for treatment. After reviewing the pathology in detail, we proceeded to discuss the different treatment options between surgery, radiation, chemotherapy, antiestrogen therapies.  Recommendations: Breast MRI will be obtained to evaluate the asymmetry and might consider a biopsy 1. Breast conserving surgery followed by 2. Oncotype DX testing to determine if chemotherapy would be of any benefit followed by 3. Adjuvant radiation therapy followed by 4. Adjuvant antiestrogen therapy  Oncotype counseling:  I discussed Oncotype DX test. I explained to the patient that this is a 21 gene panel to evaluate patient tumors DNA to calculate recurrence score. This would help determine whether patient has high risk or low risk breast cancer. She understands that if her tumor was found to be high risk, she would benefit from systemic chemotherapy. If low risk, no need of chemotherapy.  Return to clinic after surgery to discuss final pathology report and then determine if Oncotype DX testing will need to be sent.    All questions were answered. The patient knows to call the clinic with any problems, questions or concerns.    Harriette Ohara, MD 03/09/21

## 2021-03-09 NOTE — Assessment & Plan Note (Signed)
03/01/2021: Screening mammogram detected left breast mass posteriorly at the axillary tail measuring 1.5 cm: Biopsy: Grade 2 IDC ER 95%, PR 95%, HER2 negative, Ki-67 1%, left breast calcifications 0.8 cm biopsy: Benign, left breast asymmetry?  Axilla negative  Pathology and radiology counseling:Discussed with the patient, the details of pathology including the type of breast cancer,the clinical staging, the significance of ER, PR and HER-2/neu receptors and the implications for treatment. After reviewing the pathology in detail, we proceeded to discuss the different treatment options between surgery, radiation, chemotherapy, antiestrogen therapies.  Recommendations: Breast MRI will be obtained to evaluate the asymmetry and might consider a biopsy 1. Breast conserving surgery followed by 2. Oncotype DX testing to determine if chemotherapy would be of any benefit followed by 3. Adjuvant radiation therapy followed by 4. Adjuvant antiestrogen therapy  Oncotype counseling: I discussed Oncotype DX test. I explained to the patient that this is a 21 gene panel to evaluate patient tumors DNA to calculate recurrence score. This would help determine whether patient has high risk or low risk breast cancer. She understands that if her tumor was found to be high risk, she would benefit from systemic chemotherapy. If low risk, no need of chemotherapy.  Return to clinic after surgery to discuss final pathology report and then determine if Oncotype DX testing will need to be sent.

## 2021-03-10 ENCOUNTER — Encounter: Payer: Self-pay | Admitting: Genetic Counselor

## 2021-03-10 DIAGNOSIS — Z800 Family history of malignant neoplasm of digestive organs: Secondary | ICD-10-CM | POA: Insufficient documentation

## 2021-03-10 NOTE — Progress Notes (Signed)
REFERRING PROVIDER: Nicholas Lose, MD [place], [place] [address]  PRIMARY PROVIDER:  Berkley Harvey, NP  PRIMARY REASON FOR VISIT:  Encounter Diagnoses  Name Primary?   Malignant neoplasm of upper-outer quadrant of left breast in female, estrogen receptor positive ([place]) Yes   Family history of pancreatic cancer     HISTORY OF PRESENT ILLNESS:   Haley Evans, a 41 y.o. female, was seen for a [place] cancer genetics consultation during the breast multidisciplinary clinic at the request of Dr. Lindi Adie due to a personal and family history of cancer.  Haley Evans presents to clinic today to discuss the possibility of a hereditary predisposition to cancer, to discuss genetic testing, and to further clarify her future cancer risks, as well as potential cancer risks for family members.   In January 2023, at the age of 50, Haley Evans was diagnosed with invasive ductal carcinoma of the left breast.   CANCER HISTORY:  Oncology History  Malignant neoplasm of upper-outer quadrant of left breast in female, estrogen receptor positive ([place])  03/01/2021 Initial Diagnosis   Screening mammogram detected left breast mass posteriorly at the axillary tail measuring 1.5 cm: Biopsy: Grade 2 IDC ER 95%, PR 95%, HER2 negative, Ki-67 1%, left breast calcifications 0.8 cm biopsy: Benign, left breast asymmetry?  Axilla negative   03/09/2021 Cancer Staging   Staging form: Breast, AJCC 8th Edition - Clinical stage from 03/09/2021: Stage IA (cT1c, cN0, cM0, G2, ER+, PR+, HER2-) - Signed by Nicholas Lose, MD on 03/09/2021 Stage prefix: Initial diagnosis Histologic grading system: 3 grade system      RISK FACTORS:  Menarche was at age 51.  First live birth at age 7.  OCP use for approximately  17  years.  Ovaries intact: yes.  Uterus intact: yes.  Menopausal status: premenopausal.  HRT use: 0 years. Colonoscopy: no Mammogram within the last year: yes.  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  Tobacco Use   Smoking status: Every Day    Types: Cigarettes   Smokeless tobacco: Never  Vaping Use   Vaping Use: Never used  Substance and Sexual Activity   Alcohol use: Yes    Comment: weekly   Drug use: No   Sexual activity: Not on file  Other Topics Concern   Not on file  Social History Narrative   Not on file   Social Determinants of Health   Financial Resource Strain: Not on file  Food Insecurity: Not on file  Transportation Needs: Not on file  Physical Activity: Not on file  Stress: Not on file  Social Connections: Not on file     FAMILY HISTORY:  We obtained a detailed, 4-generation family history.  Significant diagnoses are listed below: Family History  Problem Relation Age of Onset   Bladder Cancer Maternal Grandmother 94   Pancreatic cancer Maternal Grandfather 18  Ms. Schnapp maternal grandmother was diagnosed with bladder cancer at age 34 and died at 21. Her maternal grandfather was diagnosed with pancreatic cancer at age 39 and died at 36. Her maternal great aunt (grandfather's sister) was diagnosed with colon cancer at an unknown age (>50), she is deceased. Her maternal great uncle (grandfather's brother) was diagnosed with lung cancer, he is deceased.   Ms. Salameh is unaware of previous family history of genetic testing for hereditary cancer risks. There is no reported Ashkenazi Jewish ancestry.   GENETIC COUNSELING ASSESSMENT: Haley Evans is a 41 y.o.  female with a personal and family history of cancer which is somewhat suggestive of a hereditary cancer syndrome and predisposition to cancer given her young age at diagnosis. We, therefore, discussed and recommended the following at today's visit.   DISCUSSION: We discussed that 5 - 10% of cancer is hereditary, with most cases of hereditary breast cancer associated with mutations in BRCA1/2.  There are other genes that can be associated with hereditary breast and pancreatic cancer syndromes. Type of cancer risk and level of risk are gene-specific. We discussed that testing is beneficial for several reasons including knowing how to follow individuals after completing their treatment, identifying whether potential treatment options would be beneficial, and understanding if other family members could be at risk for cancer and allowing them to undergo genetic testing.   We reviewed the characteristics, features and inheritance patterns of hereditary cancer syndromes. We also discussed genetic testing, including the appropriate family members to test, the process of testing, insurance coverage and turn-around-time for results. We discussed the implications of a negative, positive and/or variant of uncertain significant result. In order to get genetic test results in a timely manner so that Haley Evans can use these genetic test results for surgical decisions, we recommended Ms. Thull pursue genetic testing for the BRCAplus. Once complete, we recommend Haley Evans pursue reflex genetic testing to a more comprehensive gene panel.   [name]  was offered a common hereditary cancer panel (47 genes) and an expanded pan-cancer panel (77 genes). Haley Evans was informed of the benefits and limitations of each panel, including that expanded pan-cancer panels contain genes that do not have clear management guidelines at this point in time.  We also discussed that as the number of genes included on a panel  increases, the chances of variants of uncertain significance increases. After considering the benefits and limitations of each gene panel, Haley Evans elected to have [facility] CancerNext-Expanded Panel+RNA (77 genes).   The CancerNext-Expanded gene panel offered by [facility] and includes sequencing, rearrangement, and RNA analysis for the following 77 genes: AIP, ALK, APC, ATM, AXIN2, BAP1, BARD1, BLM, BMPR1A, BRCA1, BRCA2, BRIP1, CDC73, CDH1, CDK4, CDKN1B, CDKN2A, CHEK2, CTNNA1, DICER1, FANCC, FH, FLCN, GALNT12, KIF1B, LZTR1, MAX, MEN1, MET, MLH1, MSH2, MSH3, MSH6, MUTYH, NBN, NF1, NF2, NTHL1, PALB2, PHOX2B, PMS2, POT1, PRKAR1A, PTCH1, PTEN, RAD51C, RAD51D, RB1, RECQL, RET, SDHA, SDHAF2, SDHB, SDHC, SDHD, SMAD4, SMARCA4, SMARCB1, SMARCE1, STK11, SUFU, TMEM127, TP53, TSC1, TSC2, VHL and XRCC2 (sequencing and deletion/duplication); EGFR, EGLN1, HOXB13, KIT, MITF, PDGFRA, POLD1, and POLE (sequencing only); EPCAM and GREM1 (deletion/duplication only).   Based on Ms. Schindler's personal and family history of cancer, she meets medical criteria for genetic testing. Despite that she meets criteria, she may still have an out of pocket cost. We discussed that if her out of pocket cost for testing is over $100, the laboratory should contact them to discuss self-pay prices, patient pay assistance programs,  if applicable, and other billing options.   PLAN: After considering the risks, benefits, and limitations, Ms. Muraoka provided informed consent to pursue genetic testing and the blood sample was sent to [facility] for analysis of the CancerNext-Expanded Panel. Results should be available within approximately 1-2 weeks' time, at which point they will be disclosed by telephone to Ms. Clodfelter, as will any additional recommendations warranted by these results. Ms. Finnie will receive a summary of her genetic counseling visit and a copy of her results once available. This information will also be  available in Epic.   Ms. Shaheen questions were answered to her satisfaction today. Our contact information was provided should additional questions or concerns arise. Thank you for the referral and allowing [place] to share in the care of your patient.   Lucille Passy, MS, [facility] Genetic Counselor [email or web address] (P) [phone]  The patient was seen for a total of 20 minutes in face-to-face genetic counseling.  The patient brought her boyfriend and mother. Drs. Lindi Adie and/or Burr Medico were available to discuss this case as needed.  _______________________________________________________________________ For Office Staff:  Number of people involved in session: 3 Was an Intern/ student involved with case: no

## 2021-03-15 ENCOUNTER — Ambulatory Visit: Payer: BC Managed Care – PPO | Admitting: Plastic Surgery

## 2021-03-15 ENCOUNTER — Encounter: Payer: Self-pay | Admitting: Plastic Surgery

## 2021-03-15 ENCOUNTER — Other Ambulatory Visit: Payer: Self-pay

## 2021-03-15 VITALS — BP 119/68 | HR 63 | Ht 69.0 in | Wt 156.4 lb

## 2021-03-15 DIAGNOSIS — C50412 Malignant neoplasm of upper-outer quadrant of left female breast: Secondary | ICD-10-CM

## 2021-03-15 DIAGNOSIS — Z170 Estrogen receptor positive status [ER+]: Secondary | ICD-10-CM

## 2021-03-15 NOTE — Progress Notes (Addendum)
Patient ID: Haley Evans, female    DOB: 1980-03-31, 41 y.o.   MRN: [id number]   Chief Complaint  Patient presents with   Advice Only   Breast Cancer    The patient is a 41 year old female here for consultation for breast reconstruction.  She went for a screening mammogram and was found to have some irregularities in the left breast.  She was diagnosed with invasive ductal carcinoma grade 2 of the left breast upper outer quadrant..  It was estrogen and progesterone positive HER2 negative and a Ki-67 1%.  She is a smoker but is committed to stopping.  The right breast had cysts but appear to be negative.  She has an MRI pending and has submitted for genetic testing.  She does not have a family history of breast cancer but states her mom has multiple different kinds of cancer.  Her past medical history is positive for bipolar disorder, anxiety and depression.  She is 5 feet 9 inches tall and weighs 156 pounds.  If she has a left partial mastectomy she will be recommended for radiation and antiestrogen therapy.  The patient has not decided between a partial mastectomy, mastectomy or even bilateral mastectomies. She does want reconstruction.   Review of Systems  Constitutional: Negative.   Eyes: Negative.   Respiratory: Negative.    Cardiovascular: Negative.   Gastrointestinal: Negative.   Endocrine: Negative.   Genitourinary: Negative.   Skin:  Positive for color change.  Neurological: Negative.   Hematological: Negative.   Psychiatric/Behavioral: Negative.     Past Medical History:  Diagnosis Date   Anxiety    Bipolar 2 disorder ([place])    Depression     Past Surgical History:  Procedure Laterality Date   ARM WOUND REPAIR / CLOSURE Right    fractured arm   CESAREAN SECTION        Current Outpatient Medications:    ALPRAZolam (XANAX) 0.5 MG tablet, TAKE 1 TABLET BY MOUTH TWICE DAILY AS NEEDED FOR ANXIETY, Disp: 10 tablet, Rfl: 0   B Complex-C (SUPER B COMPLEX PO), Take  by mouth daily., Disp: , Rfl:    hydrOXYzine (ATARAX/VISTARIL) 25 MG tablet, Take 25 mg by mouth at bedtime., Disp: , Rfl:    levonorgestrel (MIRENA) 20 MCG/24HR IUD, Mirena 20 mcg/24 hours (5 yrs) 52 mg intrauterine device  Take 1 device by intrauterine route., Disp: , Rfl:    Multiple Vitamin (MULTIVITAMIN) tablet, Take 1 tablet by mouth daily., Disp: , Rfl:    valACYclovir (VALTREX) 1000 MG tablet, , Disp: , Rfl:    VITAMIN E PO, Take by mouth daily., Disp: , Rfl:    Objective:   Vitals:   03/15/21 1525  BP: 119/68  Pulse: 63  SpO2: 99%    Physical Exam Vitals and nursing note reviewed.  Constitutional:      Appearance: Normal appearance.  HENT:     Head: Normocephalic and atraumatic.  Cardiovascular:     Rate and Rhythm: Normal rate.     Pulses: Normal pulses.  Pulmonary:     Effort: Pulmonary effort is normal.  Abdominal:     General: There is no distension.     Palpations: Abdomen is soft.  Musculoskeletal:        General: No swelling or deformity.  Skin:    General: Skin is warm.     Capillary Refill: Capillary refill takes less than 2 seconds.     Coloration: Skin is not jaundiced.  Findings: Bruising present.  Neurological:     Mental Status: She is alert and oriented to person, place, and time.  Psychiatric:        Mood and Affect: Mood normal.        Behavior: Behavior normal.        Thought Content: Thought content normal.        Judgment: Judgment normal.    Assessment & Plan:  Malignant neoplasm of upper-outer quadrant of left breast in female, estrogen receptor positive ([place]) The options for reconstruction we explained to the patient / family for breast reconstruction.  There are two general categories of reconstruction.  We can reconstruction a breast with implants or use the patient's own tissue.  These were further discussed as listed.  Breast reconstruction is an optional procedure and eligibility depends on the full spectrum of the health of  the patient and any co-morbidities.  More than one surgery is often needed to complete the reconstruction process.  The process can take three to twelve months to complete.  The breasts will not be identical due to many factors such as rib differences, shoulder asymmetry and treatments such as radiation.  The goal is to get the breasts to look normal and symmetrical in clothes.  Scars are a part of surgery and may fade some in time but will always be present under clothes.  Surgery may be an option on the non-cancer breast to achieve more symmetry.  No matter which procedure is chosen there is always the risk of complications and even failure of the body to heal.  This could result in no breast.    The options for reconstruction include:  1. Placement of a tissue expander with Acellular dermal matrix. When the expander is the desired size surgery is performed to remove the expander and place an implant.  In some cases the implant can be placed without an expander.  2. Autologous reconstruction can include using a muscle or tissue from another area of the body to create a breast.  3. Combined procedures (ie. latissismus dorsi flap) can be done with an expander / implant placed under the muscle.   The risks, benefits, scars and recovery time were discussed for each of the above. Risks include bleeding, infection, hematoma, seroma, scarring, pain, wound healing complications, flap loss, fat necrosis, capsular contracture, need for implant removal, donor site complications, bulge, hernia, umbilical necrosis, need for urgent reoperation, and need for dressing changes.   The procedure the patient selected / that was best for the patient, was then discussed in further detail.  Total time: 45 minutes. This includes time spent with the patient during the visit as well as time spent before and after the visit reviewing the chart, documenting the encounter, making phone calls and reviewing studies.   The patient is  waiting for genetics and an MRI that will be in the next week.  She would like to do a televisit visit and talk more when she has that information and then she will make her decision for her cancer treatment.  We will plan a telemetry visit for 2 weeks so we can discuss her options further when she has more information.  Pictures were obtained of the patient and placed in the chart with the patient's or guardian's permission.   [place], DO

## 2021-03-17 ENCOUNTER — Telehealth: Payer: Self-pay | Admitting: *Deleted

## 2021-03-17 ENCOUNTER — Encounter: Payer: Self-pay | Admitting: *Deleted

## 2021-03-17 NOTE — Telephone Encounter (Signed)
Spoke with patient to follow up from [facility] 1/11 and assess navigation needs. Patient asked about PET scan because a family member told her she should have one and explained that in her case insurance would not approve PET scan for stage 1 breast cancer.  She verbalized understanding.  Patient met with Dr. Marla Roe and is still undecided about sx approach. She will wait for genetic results and MRI results and make a decision.  Encouraged her to call should she have any other questions or concerns. Patient verbalized understanding.

## 2021-03-18 ENCOUNTER — Other Ambulatory Visit: Payer: Self-pay

## 2021-03-18 ENCOUNTER — Encounter: Payer: Self-pay | Admitting: Genetic Counselor

## 2021-03-18 ENCOUNTER — Telehealth: Payer: Self-pay | Admitting: Genetic Counselor

## 2021-03-18 ENCOUNTER — Ambulatory Visit
Admission: RE | Admit: 2021-03-18 | Discharge: 2021-03-18 | Disposition: A | Discharge status: Home / Self Care | Payer: BC Managed Care – PPO | Source: Ambulatory Visit | Attending: General Surgery | Admitting: General Surgery

## 2021-03-18 DIAGNOSIS — Z1379 Encounter for other screening for genetic and chromosomal anomalies: Secondary | ICD-10-CM | POA: Insufficient documentation

## 2021-03-18 DIAGNOSIS — C50412 Malignant neoplasm of upper-outer quadrant of left female breast: Secondary | ICD-10-CM

## 2021-03-18 MED ORDER — GADOBUTROL 1 MMOL/ML IV SOLN
7.0000 mL | Freq: Once | INTRAVENOUS | Status: AC | PRN
  Administered 2021-03-18: 7 mL via INTRAVENOUS

## 2021-03-18 NOTE — Telephone Encounter (Signed)
I contacted Ms. Riviera to discuss her genetic testing results. No pathogenic variants were identified in the 8 genes analyzed. Of note, we are still waiting on analysis of additional genes and will contact her when they are available.   The test report has been scanned into EPIC and is located under the Molecular Pathology section of the Results Review tab.  A portion of the result report is included below for reference.   Lucille Passy, MS, [facility] Genetic Counselor [email or web address] (P) [phone]

## 2021-03-21 ENCOUNTER — Encounter: Payer: Self-pay | Admitting: *Deleted

## 2021-03-23 ENCOUNTER — Ambulatory Visit: Payer: Self-pay | Admitting: General Surgery

## 2021-03-25 ENCOUNTER — Telehealth: Payer: Self-pay | Admitting: Genetic Counselor

## 2021-03-25 NOTE — Telephone Encounter (Signed)
I attempted to contact Haley Evans to discuss her genetic testing results (77 genes). I left a voicemail requesting she call me back at [phone].  Lucille Passy, MS, [facility] Genetic Counselor [email or web address] (P) [phone]

## 2021-03-25 NOTE — Telephone Encounter (Signed)
I contacted Ms. Gripp to discuss her genetic testing results. No pathogenic variants were identified in the 77 genes analyzed. Detailed clinic note to follow.  The test report has been scanned into EPIC and is located under the Molecular Pathology section of the Results Review tab.  A portion of the result report is included below for reference.   Lucille Passy, MS, [facility] Genetic Counselor [email or web address] (P) [phone]

## 2021-03-28 ENCOUNTER — Ambulatory Visit: Payer: Self-pay | Admitting: Genetic Counselor

## 2021-03-28 DIAGNOSIS — Z1379 Encounter for other screening for genetic and chromosomal anomalies: Secondary | ICD-10-CM

## 2021-03-28 NOTE — Progress Notes (Addendum)
° °  Subjective:    Patient ID: Haley Evans, female    DOB: 04/26/1980, 41 y.o.   MRN: [id number]  Visit:03/28/21  The patient is a 41 year old female joining me by phone for further discussion about her breast reconstruction.  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  It was estrogen and progesterone positive and HER2 negative.  She is a smoker but has committed to stopping.  She had genetic testing and an MRI.  She has decided on bilateral mastectomies even though her genetics was negative.  Her past medical history is positive for bipolar disorder, anxiety and depression.  She is 5 feet 9 inches tall and weighs 156 pounds.  She does want reconstruction.    Review of Systems  Constitutional: Negative.   Eyes: Negative.   Respiratory: Negative.    Cardiovascular: Negative.       Objective:   Physical Exam      Assessment & Plan:     ICD-10-CM   1. Malignant neoplasm of upper-outer quadrant of left breast in female, estrogen receptor positive ([place])  C50.412    Z17.0        I connected with  Lamona Kai on 03/28/21 by phone and verified that I am speaking with the correct person using two identifiers. She was at home and I was at the office.  I spent 15 min in the following manner: Review of chart, discussion of results and review of genetics and MRI.  Discussion of options and decision for surgical intervention.  I also talk with Dr. Marlou Starks and let him know what the patient has decided.  Plan for bilateral immediate breast reconstruction with expander and Flex HD placement.   I discussed the limitations of evaluation and management by telemedicine. The patient expressed understanding and agreed to proceed.

## 2021-03-28 NOTE — Progress Notes (Signed)
HPI:   Ms. Battaglia was previously seen in the [place] [facility] due to a personal and family history of cancer and concerns regarding a hereditary predisposition to cancer. Please refer to our prior cancer genetics clinic note for more information regarding our discussion, assessment and recommendations, at the time. Ms. Goodridge recent genetic test results were disclosed to her, as were recommendations warranted by these results. These results and recommendations are discussed in more detail below.  CANCER HISTORY:  Oncology History  Malignant neoplasm of upper-outer quadrant of left breast in female, estrogen receptor positive ([place])  03/01/2021 Initial Diagnosis   Screening mammogram detected left breast mass posteriorly at the axillary tail measuring 1.5 cm: Biopsy: Grade 2 IDC ER 95%, PR 95%, HER2 negative, Ki-67 1%, left breast calcifications 0.8 cm biopsy: Benign, left breast asymmetry?  Axilla negative   03/09/2021 Cancer Staging   Staging form: Breast, AJCC 8th Edition - Clinical stage from 03/09/2021: Stage IA (cT1c, cN0, cM0, G2, ER+, PR+, HER2-) - Signed by Nicholas Lose, MD on 03/09/2021 Stage prefix: Initial diagnosis Histologic grading system: 3 grade system     Genetic Testing   [facility] CancerNext-Expanded Panel is Negative. Report date was 03/21/2021.  The CancerNext-Expanded gene panel offered by [facility] and includes sequencing, rearrangement, and RNA analysis for the following 77 genes: AIP, ALK, APC, ATM, AXIN2, BAP1, BARD1, BLM, BMPR1A, BRCA1, BRCA2, BRIP1, CDC73, CDH1, CDK4, CDKN1B, CDKN2A, CHEK2, CTNNA1, DICER1, FANCC, FH, FLCN, GALNT12, KIF1B, LZTR1, MAX, MEN1, MET, MLH1, MSH2, MSH3, MSH6, MUTYH, NBN, NF1, NF2, NTHL1, PALB2, PHOX2B, PMS2, POT1, PRKAR1A, PTCH1, PTEN, RAD51C, RAD51D, RB1, RECQL, RET, SDHA, SDHAF2, SDHB, SDHC, SDHD, SMAD4, SMARCA4, SMARCB1, SMARCE1, STK11, SUFU, TMEM127, TP53, TSC1, TSC2, VHL and XRCC2 (sequencing and deletion/duplication);  EGFR, EGLN1, HOXB13, KIT, MITF, PDGFRA, POLD1, and POLE (sequencing only); EPCAM and GREM1 (deletion/duplication only).      FAMILY HISTORY:  We obtained a detailed, 4-generation family history.  Significant diagnoses are listed below:      Family History  Problem Relation Age of Onset   Bladder Cancer Maternal Grandmother 94   Pancreatic cancer Maternal Grandfather 20          Ms. Cuartas's maternal grandmother was diagnosed with bladder cancer at age 64 and died at 63. Her maternal grandfather was diagnosed with pancreatic cancer at age 40 and died at 56. Her maternal great aunt (grandfather's sister) was diagnosed with colon cancer at an unknown age (>50), she is deceased. Her maternal great uncle (grandfather's brother) was diagnosed with lung cancer, he is deceased.    Ms. Wallenstein is unaware of previous family history of genetic testing for hereditary cancer risks. There is no reported Ashkenazi Jewish ancestry.     GENETIC TEST RESULTS:  The [facility] CancerNext-Expanded Panel found no pathogenic mutations.  The CancerNext-Expanded gene panel offered by [facility] and includes sequencing, rearrangement, and RNA analysis for the following 77 genes: AIP, ALK, APC, ATM, AXIN2, BAP1, BARD1, BLM, BMPR1A, BRCA1, BRCA2, BRIP1, CDC73, CDH1, CDK4, CDKN1B, CDKN2A, CHEK2, CTNNA1, DICER1, FANCC, FH, FLCN, GALNT12, KIF1B, LZTR1, MAX, MEN1, MET, MLH1, MSH2, MSH3, MSH6, MUTYH, NBN, NF1, NF2, NTHL1, PALB2, PHOX2B, PMS2, POT1, PRKAR1A, PTCH1, PTEN, RAD51C, RAD51D, RB1, RECQL, RET, SDHA, SDHAF2, SDHB, SDHC, SDHD, SMAD4, SMARCA4, SMARCB1, SMARCE1, STK11, SUFU, TMEM127, TP53, TSC1, TSC2, VHL and XRCC2 (sequencing and deletion/duplication); EGFR, EGLN1, HOXB13, KIT, MITF, PDGFRA, POLD1, and POLE (sequencing only); EPCAM and GREM1 (deletion/duplication only).     The test report has been scanned into EPIC and  is located under the Molecular Pathology section of the Results Review tab.  A portion of the  result report is included below for reference. Genetic testing reported out on 03/21/2021.        Even though a pathogenic variant was not identified, possible explanations for the cancer in the family may include: There may be no hereditary risk for cancer in the family. The cancers in Ms. Schleich and/or her family may be due to other genetic or environmental factors. There may be a gene mutation in one of these genes that current testing methods cannot detect, but that chance is small. There could be another gene that has not yet been discovered, or that we have not yet tested, that is responsible for the cancer diagnoses in the family.  It is also possible there is a hereditary cause for the cancer in the family that Ms. Neault did not inherit.  Therefore, it is important to remain in touch with cancer genetics in the future so that we can continue to offer Ms. Clements the most up to date genetic testing.   ADDITIONAL GENETIC TESTING:  We discussed with Ms. Croom that her genetic testing was fairly extensive.  If there are genes identified to increase cancer risk that can be analyzed in the future, we would be happy to discuss and coordinate this testing at that time.    CANCER SCREENING RECOMMENDATIONS:  Ms. Murchison test result is considered negative (normal).  This means that we have not identified a hereditary cause for her personal and family history of cancer at this time.   An individual's cancer risk and medical management are not determined by genetic test results alone. Overall cancer risk assessment incorporates additional factors, including personal medical history, family history, and any available genetic information that may result in a personalized plan for cancer prevention and surveillance. Therefore, it is recommended she continue to follow the cancer management and screening guidelines provided by her oncology and primary healthcare  provider.  RECOMMENDATIONS FOR FAMILY MEMBERS:   Since she did not inherit a mutation in a cancer predisposition gene included on this panel, her children could not have inherited a mutation from her in one of these genes. Individuals in this family might be at some increased risk of developing cancer, over the general population risk, due to the family history of cancer. We recommend women in this family have a yearly mammogram beginning at age 60, or 58 years younger than the earliest onset of cancer, an annual clinical breast exam, and perform monthly breast self-exams. Therefore, her daughter should begin mammograms at age 49. Other members of the family may still carry a pathogenic variant in one of these genes that Ms. Belshe did not inherit. Based on the family history, we recommend her mother and maternal aunts have genetic counseling and testing.   FOLLOW-UP:  Cancer genetics is a rapidly advancing field and it is possible that new genetic tests will be appropriate for her and/or her family members in the future. We encouraged her to remain in contact with cancer genetics on an annual basis so we can update her personal and family histories and let her know of advances in cancer genetics that may benefit this family.   Our contact number was provided. Ms. Boeve questions were answered to her satisfaction, and she knows she is welcome to call us at anytime with additional questions or concerns.   Lucille Passy, MS, [facility] Genetic Counselor [email or web address] (P) [phone]

## 2021-03-29 ENCOUNTER — Encounter: Payer: Self-pay | Admitting: *Deleted

## 2021-03-29 ENCOUNTER — Ambulatory Visit (INDEPENDENT_AMBULATORY_CARE_PROVIDER_SITE_OTHER): Payer: BC Managed Care – PPO | Admitting: Plastic Surgery

## 2021-03-29 ENCOUNTER — Other Ambulatory Visit: Payer: Self-pay

## 2021-03-29 DIAGNOSIS — Z170 Estrogen receptor positive status [ER+]: Secondary | ICD-10-CM

## 2021-03-29 DIAGNOSIS — C50412 Malignant neoplasm of upper-outer quadrant of left female breast: Secondary | ICD-10-CM | POA: Diagnosis not present

## 2021-03-30 ENCOUNTER — Other Ambulatory Visit: Payer: Self-pay | Admitting: *Deleted

## 2021-03-30 ENCOUNTER — Telehealth: Payer: Self-pay | Admitting: Hematology and Oncology

## 2021-03-30 ENCOUNTER — Telehealth: Payer: Self-pay | Admitting: *Deleted

## 2021-03-30 ENCOUNTER — Encounter: Payer: Self-pay | Admitting: *Deleted

## 2021-03-30 MED ORDER — TAMOXIFEN CITRATE 10 MG PO TABS: 10.0000 mg | ORAL_TABLET | Freq: Every day | ORAL | 3 refills | Status: DC

## 2021-03-30 NOTE — Telephone Encounter (Signed)
Ordered oncotype (core) per Dr. Lindi Adie. Faxed requisition to [facility].   Spoke with patient about starting tamoxifen since her sx is not until middle of March. Discussed side effects and instructions. Verified her pharmacy. Rx called in.  Informed her we would send the oncotype from her core bx to see if there is any chemo benefit and let her know we will call her when we receive results back. Patient in agreement to start tamoxifen and verbalized understanding.

## 2021-03-30 NOTE — Telephone Encounter (Signed)
Sch per 1/31 inbasket, pt aware

## 2021-04-01 ENCOUNTER — Encounter: Payer: Self-pay | Admitting: Plastic Surgery

## 2021-04-01 ENCOUNTER — Other Ambulatory Visit: Payer: Self-pay

## 2021-04-01 ENCOUNTER — Telehealth (INDEPENDENT_AMBULATORY_CARE_PROVIDER_SITE_OTHER): Payer: BC Managed Care – PPO | Admitting: Plastic Surgery

## 2021-04-01 DIAGNOSIS — Z170 Estrogen receptor positive status [ER+]: Secondary | ICD-10-CM | POA: Diagnosis not present

## 2021-04-01 DIAGNOSIS — C50412 Malignant neoplasm of upper-outer quadrant of left female breast: Secondary | ICD-10-CM | POA: Diagnosis not present

## 2021-04-01 NOTE — Progress Notes (Signed)
° °  Subjective:    Patient ID: Haley Evans, female    DOB: 1980/08/02, 41 y.o.   MRN: [id number]  The patient is a 41 year old female joining me by phone for breast reconstruction conversation.  She was talking to the nurse navigator and got concerned about recurrence and wanted to ask more questions.  She was diagnosed with invasive ductal carcinoma grade 2 of the left breast in the upper outer quadrant.  It is estrogen and progesterone positive and HER2 negative.  She is smoking still but trying to quit.  Her genetics are negative.  Her MRI did not show any further concern.  She has a past medical history for depression and bipolar disorder.  She is 5 feet 9 inches tall and weighs 156 pounds.  She wants to move ahead with bilateral mastectomies with immediate reconstruction with expander and Flex HD.   Review of Systems  Constitutional: Negative.   Eyes: Negative.   Respiratory: Negative.    Genitourinary: Negative.       Objective:   Physical Exam        Assessment & Plan:     ICD-10-CM   1. Malignant neoplasm of upper-outer quadrant of left breast in female, estrogen receptor positive ([place])  C50.412    Z17.0       I connected with  Sherylann Vallely on 04/01/21 by phone and verified that I am speaking with the correct person using two identifiers.  The patient was at home and I was at the office.  I spent 15 min in the following manner: Review of chart, review of information, discussion with patient and documentation.  We also discussed recovery and downtime.  Plan for immediate breast reconstruction with expander and Flex HD placement.   I discussed the limitations of evaluation and management by telemedicine. The patient expressed understanding and agreed to proceed.

## 2021-04-12 ENCOUNTER — Encounter: Payer: Self-pay | Admitting: *Deleted

## 2021-04-12 ENCOUNTER — Telehealth: Payer: Self-pay | Admitting: *Deleted

## 2021-04-12 ENCOUNTER — Encounter: Payer: Self-pay | Admitting: General Practice

## 2021-04-12 NOTE — Progress Notes (Signed)
[facility] Psychosocial Distress Screening Spiritual Care  Met with Lamis by phone following [facility] to introduce [place] team/resources, reviewing distress screen per protocol.  The patient scored a 1 on the Psychosocial Distress Thermometer which indicates mild distress. Also assessed for distress and other psychosocial needs.   ONCBCN DISTRESS SCREENING 04/12/2021  Screening Type Initial Screening  Distress experienced in past week (1-10) 1  Emotional problem type Nervousness/Anxiety  Information Concerns Type Lack of info about diagnosis;Lack of info about treatment  Physical Problem type Pain;Sleep/insomnia  Referral to support programs Yes   O[place]a was very receptive of call, reports doing well overall, and has good support from family and friends. She notes that she is using faith, calm, perspective, and positive self-talk to cope, and she plans to explore [place] (Patient and [facility]) support programming folder for activities of interest.  Follow up needed: No. [place] is aware of ongoing chaplain availability, should needs arise or circumstances change.   [place], [place], [facility] Pager [phone] Voicemail [phone]

## 2021-04-12 NOTE — Telephone Encounter (Signed)
Received oncotype results of 13/4%. Left message for a return phone call to give results.

## 2021-04-13 ENCOUNTER — Encounter (HOSPITAL_COMMUNITY): Payer: Self-pay

## 2021-04-21 NOTE — Progress Notes (Addendum)
Patient ID: Haley Evans, female    DOB: [birth year], 41 y.o.   MRN: [id number]  Chief Complaint  Patient presents with   Pre-op Exam      ICD-10-CM   1. Malignant neoplasm of upper-outer quadrant of left breast in female, estrogen receptor positive ([place])  C50.412    Z17.0       History of Present Illness: Haley Evans is a 41 y.o.  female  with a history of invasive ductal carcinoma grade 2 of the left breast in the upper outer quadrant..  She presents for preoperative evaluation for upcoming procedure, bilateral immediate breast reconstruction with placement of tissue expanders and Flex HD, scheduled for 05/12/2021 with Dr. Marla Roe in conjunction with bilateral nipple sparing mastectomy by Dr. Marlou Starks  The patient has not had problems with anesthesia. No history of DVT/PE.  No family history of DVT/PE.  No family or personal history of bleeding or clotting disorders.  Patient is not currently taking any blood thinners.  No history of CVA/MI.   Summary of Previous Visit: Patient was diagnosed with left breast cancer, it is ER/PR positive and HER2 negative.  She is smoking but is trying to quit.  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 Currently on tamoxifen.  Patient reports she is feeling well lately, no recent changes to her health.  She had her Mirena IUD removed, currently has copper IUD in place.   Past Medical History: Allergies: Allergies  Allergen Reactions   Morphine Itching    Current Medications:  Current Outpatient Medications:    ALPRAZolam (XANAX) 0.5 MG tablet, TAKE 1 TABLET BY MOUTH TWICE DAILY AS NEEDED FOR ANXIETY, Disp: 10 tablet, Rfl: 0   B Complex-C (SUPER B COMPLEX PO), Take by mouth daily., Disp: , Rfl:    hydrOXYzine (ATARAX/VISTARIL) 25 MG tablet, Take 25 mg by mouth at bedtime., Disp: , Rfl:    levonorgestrel (MIRENA) 20 MCG/24HR IUD,  Mirena 20 mcg/24 hours (5 yrs) 52 mg intrauterine device  Take 1 device by intrauterine route., Disp: , Rfl:    Multiple Vitamin (MULTIVITAMIN) tablet, Take 1 tablet by mouth daily., Disp: , Rfl:    tamoxifen (NOLVADEX) 10 MG tablet, Take 1 tablet (10 mg total) by mouth daily., Disp: 30 tablet, Rfl: 3   valACYclovir (VALTREX) 1000 MG tablet, , Disp: , Rfl:    VITAMIN E PO, Take by mouth daily., Disp: , Rfl:   Past Medical Problems: Past Medical History:  Diagnosis Date   Anxiety    Bipolar 2 disorder ([place])    Depression     Past Surgical History: Past Surgical History:  Procedure Laterality Date   ARM WOUND REPAIR / CLOSURE Right    fractured arm   CESAREAN SECTION      Social History: Social History   Socioeconomic History   Marital status: Single    Spouse name: Not on file   Number of children: Not on file   Years of education: Not on file   Highest education level: Not on file  Occupational History   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  Tobacco Use   Smoking status: Every Day    Types: Cigarettes   Smokeless tobacco: Never  Vaping Use   Vaping Use: Never used  Substance and Sexual Activity   Alcohol use: Yes    Comment: weekly   Drug use: No  Sexual activity: Not on file  Other Topics Concern   Not on file  Social History Narrative   Not on file   Social Determinants of Health   Financial Resource Strain: Not on file  Food Insecurity: Not on file  Transportation Needs: Not on file  Physical Activity: Not on file  Stress: Not on file  Social Connections: Not on file  Intimate Partner Violence: Not on file    Family History: Family History  Problem Relation Age of Onset   Bladder Cancer Maternal Grandmother 94   Pancreatic cancer Maternal Grandfather 30    Review of Systems: Review of Systems  Constitutional: Negative.   Respiratory: Negative.    Cardiovascular: Negative.   Neurological: Negative.    Physical Exam: Vital  Signs BP 138/75 (BP Location: Left Arm, Patient Position: Sitting, Cuff Size: Small)    Pulse 79    Ht 5\' 9"  (1.753 m)    Wt 157 lb 12.8 oz (71.6 kg)    SpO2 99%    BMI 23.30 kg/m   Physical Exam  Constitutional:      General: Not in acute distress.    Appearance: Normal appearance. Not ill-appearing.  HENT:     Head: Normocephalic and atraumatic.  Eyes:     Pupils: Pupils are equal, round Neck:     Musculoskeletal: Normal range of motion.  Cardiovascular:     Rate and Rhythm: Normal rate    Pulses: Normal pulses.  Pulmonary:     Effort: Pulmonary effort is normal. No respiratory distress.  Abdominal:     General: Abdomen is flat. There is no distension.  Musculoskeletal: Normal range of motion.  Skin:    General: Skin is warm and dry.     Findings: No erythema or rash.  Neurological:     General: No focal deficit present.     Mental Status: Alert and oriented to person, place, and time. Mental status is at baseline.     Motor: No weakness.  Psychiatric:        Mood and Affect: Mood normal.        Behavior: Behavior normal.    Assessment/Plan: The patient is scheduled for bilateral immediate breast reconstruction and placement of tissue expanders and Flex HD with Dr. Marla Roe.  Risks, benefits, and alternatives of procedure discussed, questions answered and consent obtained.    Smoking Status: Recently quit smoking, currently on Chantix  Caprini Score: 5, high; Risk Factors include: Currently on tamoxifen, history of breast cancer and length of planned surgery. Recommendation for mechanical prophylaxis. Encourage early ambulation.   Pictures obtained: @consult   Post-op Rx sent to pharmacy:  Norco, Keflex, Zofran, Valium  Patient was provided with the breast reconstruction and General Surgical Risk consent document and Pain Medication Agreement prior to their appointment.  They had adequate time to read through the risk consent documents and Pain Medication Agreement. We  also discussed them in person together during this preop appointment. All of their questions were answered to their satisfaction.  Recommended calling if they have any further questions.  Risk consent form and Pain Medication Agreement to be scanned into patient's chart.  The risks that can be encountered with and after placement of a breast expander placement were discussed and include the following but not limited to these: bleeding, infection, delayed healing, anesthesia risks, skin sensation changes, injury to structures including nerves, blood vessels, and muscles which may be temporary or permanent, allergies to tape, suture materials and glues, blood products, topical  preparations or injected agents, skin contour irregularities, skin discoloration and swelling, deep vein thrombosis, cardiac and pulmonary complications, pain, which may persist, fluid accumulation, wrinkling of the skin over the expander, changes in nipple or breast sensation, expander leakage or rupture, faulty position of the expander, persistent pain, formation of tight scar tissue around the expander (capsular contracture), possible need for revisional surgery or staged procedures.  Pt to hold tamoxifen 1 week before surgery, restart 1 week post op per Oncology.  Electronically signed by: Carola Rhine Lourdez Mcgahan, PA-C 04/22/2021 1:18 PM

## 2021-04-22 ENCOUNTER — Telehealth: Payer: Self-pay | Admitting: *Deleted

## 2021-04-22 ENCOUNTER — Ambulatory Visit (INDEPENDENT_AMBULATORY_CARE_PROVIDER_SITE_OTHER): Payer: BC Managed Care – PPO | Admitting: Surgical

## 2021-04-22 ENCOUNTER — Other Ambulatory Visit: Payer: Self-pay

## 2021-04-22 ENCOUNTER — Encounter: Payer: Self-pay | Admitting: Surgical

## 2021-04-22 VITALS — BP 138/75 | HR 79 | Ht 69.0 in | Wt 157.8 lb

## 2021-04-22 DIAGNOSIS — C50412 Malignant neoplasm of upper-outer quadrant of left female breast: Secondary | ICD-10-CM

## 2021-04-22 DIAGNOSIS — Z170 Estrogen receptor positive status [ER+]: Secondary | ICD-10-CM

## 2021-04-22 MED ORDER — ONDANSETRON HCL 4 MG PO TABS: 4.0000 mg | ORAL_TABLET | Freq: Three times a day (TID) | ORAL | 0 refills | Status: DC | PRN

## 2021-04-22 MED ORDER — DIAZEPAM 2 MG PO TABS: 2.0000 mg | ORAL_TABLET | Freq: Two times a day (BID) | ORAL | 0 refills | Status: DC | PRN

## 2021-04-22 MED ORDER — HYDROCODONE-ACETAMINOPHEN 5-325 MG PO TABS: 1.0000 | ORAL_TABLET | Freq: Four times a day (QID) | ORAL | 0 refills | Status: AC | PRN

## 2021-04-22 MED ORDER — CEPHALEXIN 500 MG PO CAPS: 500.0000 mg | ORAL_CAPSULE | Freq: Four times a day (QID) | ORAL | 0 refills | Status: AC

## 2021-04-22 NOTE — Telephone Encounter (Signed)
Received VM regarding some migraines patient has had and thinks it may due to the tamoxifen. Attempted to call patient back but had to leave VM for a return phone call.

## 2021-05-02 ENCOUNTER — Encounter (HOSPITAL_BASED_OUTPATIENT_CLINIC_OR_DEPARTMENT_OTHER): Payer: Self-pay | Admitting: General Surgery

## 2021-05-02 ENCOUNTER — Other Ambulatory Visit: Payer: Self-pay

## 2021-05-09 NOTE — Progress Notes (Signed)

## 2021-05-12 ENCOUNTER — Encounter (HOSPITAL_BASED_OUTPATIENT_CLINIC_OR_DEPARTMENT_OTHER): Payer: Self-pay | Admitting: General Surgery

## 2021-05-12 ENCOUNTER — Ambulatory Visit (HOSPITAL_BASED_OUTPATIENT_CLINIC_OR_DEPARTMENT_OTHER): Payer: BC Managed Care – PPO | Admitting: Anesthesiology

## 2021-05-12 ENCOUNTER — Other Ambulatory Visit: Payer: Self-pay

## 2021-05-12 ENCOUNTER — Encounter (HOSPITAL_BASED_OUTPATIENT_CLINIC_OR_DEPARTMENT_OTHER)
Admission: RE | Disposition: A | Discharge status: Home / Self Care | Payer: Self-pay | Source: Home / Self Care | Attending: Plastic Surgery

## 2021-05-12 ENCOUNTER — Observation Stay (HOSPITAL_BASED_OUTPATIENT_CLINIC_OR_DEPARTMENT_OTHER)
Admission: RE | Admit: 2021-05-12 | Discharge: 2021-05-13 | Disposition: A | Discharge status: Home / Self Care | Payer: BC Managed Care – PPO | Attending: Plastic Surgery | Admitting: Plastic Surgery

## 2021-05-12 DIAGNOSIS — C50919 Malignant neoplasm of unspecified site of unspecified female breast: Secondary | ICD-10-CM | POA: Diagnosis present

## 2021-05-12 DIAGNOSIS — F17210 Nicotine dependence, cigarettes, uncomplicated: Secondary | ICD-10-CM | POA: Diagnosis not present

## 2021-05-12 DIAGNOSIS — C50412 Malignant neoplasm of upper-outer quadrant of left female breast: Principal | ICD-10-CM | POA: Insufficient documentation

## 2021-05-12 DIAGNOSIS — C50912 Malignant neoplasm of unspecified site of left female breast: Secondary | ICD-10-CM | POA: Diagnosis present

## 2021-05-12 DIAGNOSIS — Z170 Estrogen receptor positive status [ER+]: Secondary | ICD-10-CM | POA: Insufficient documentation

## 2021-05-12 DIAGNOSIS — N6081 Other benign mammary dysplasias of right breast: Secondary | ICD-10-CM | POA: Insufficient documentation

## 2021-05-12 DIAGNOSIS — Z79899 Other long term (current) drug therapy: Secondary | ICD-10-CM | POA: Diagnosis not present

## 2021-05-12 HISTORY — PX: NIPPLE SPARING MASTECTOMY: SHX6537

## 2021-05-12 HISTORY — DX: Malignant (primary) neoplasm, unspecified: C80.1

## 2021-05-12 HISTORY — PX: PRE-PECTORAL BREAST RECON W/ PLACEMENT OF TISSUE EXPANDERS, DERMAL GRAFT: SHX6782

## 2021-05-12 HISTORY — PX: NIPPLE SPARING MASTECTOMY WITH SENTINEL LYMPH NODE BIOPSY: SHX6826

## 2021-05-12 LAB — POCT PREGNANCY, URINE: Preg Test, Ur: NEGATIVE

## 2021-05-12 SURGERY — NIPPLE SPARING MASTECTOMY WITH SENTINEL LYMPH NODE BIOPSY
Anesthesia: Regional | Site: Breast | Laterality: Right

## 2021-05-12 MED ORDER — CEFAZOLIN SODIUM-DEXTROSE 2-4 GM/100ML-% IV SOLN
2.0000 g | INTRAVENOUS | Status: AC
  Administered 2021-05-12: 2 g via INTRAVENOUS

## 2021-05-12 MED ORDER — MIDAZOLAM HCL 2 MG/2ML IJ SOLN
2.0000 mg | Freq: Once | INTRAMUSCULAR | Status: AC
  Administered 2021-05-12: 2 mg via INTRAVENOUS

## 2021-05-12 MED ORDER — CELECOXIB 200 MG PO CAPS
200.0000 mg | ORAL_CAPSULE | ORAL | Status: AC
  Administered 2021-05-12: 200 mg via ORAL

## 2021-05-12 MED ORDER — MIDAZOLAM HCL 2 MG/2ML IJ SOLN
INTRAMUSCULAR | Status: AC
  Filled 2021-05-12: qty 2

## 2021-05-12 MED ORDER — CELECOXIB 200 MG PO CAPS
ORAL_CAPSULE | ORAL | Status: AC
  Filled 2021-05-12: qty 1

## 2021-05-12 MED ORDER — GABAPENTIN 300 MG PO CAPS
ORAL_CAPSULE | ORAL | Status: AC
  Filled 2021-05-12: qty 1

## 2021-05-12 MED ORDER — LIDOCAINE HCL (CARDIAC) PF 100 MG/5ML IV SOSY
PREFILLED_SYRINGE | INTRAVENOUS | Status: DC | PRN
  Administered 2021-05-12: 40 mg via INTRAVENOUS

## 2021-05-12 MED ORDER — CHLORHEXIDINE GLUCONATE CLOTH 2 % EX PADS: 6.0000 | MEDICATED_PAD | Freq: Once | CUTANEOUS | Status: DC

## 2021-05-12 MED ORDER — CEFAZOLIN SODIUM-DEXTROSE 2-4 GM/100ML-% IV SOLN: 2.0000 g | INTRAVENOUS | Status: DC

## 2021-05-12 MED ORDER — DEXAMETHASONE SODIUM PHOSPHATE 4 MG/ML IJ SOLN
INTRAMUSCULAR | Status: DC | PRN
  Administered 2021-05-12: 5 mg via INTRAVENOUS

## 2021-05-12 MED ORDER — LACTATED RINGERS IV SOLN: INTRAVENOUS | Status: DC

## 2021-05-12 MED ORDER — ROCURONIUM BROMIDE 100 MG/10ML IV SOLN
INTRAVENOUS | Status: DC | PRN
  Administered 2021-05-12: 50 mg via INTRAVENOUS
  Administered 2021-05-12: 30 mg via INTRAVENOUS
  Administered 2021-05-12: 20 mg via INTRAVENOUS

## 2021-05-12 MED ORDER — PROPOFOL 10 MG/ML IV BOLUS
INTRAVENOUS | Status: DC | PRN
  Administered 2021-05-12: 200 mg via INTRAVENOUS

## 2021-05-12 MED ORDER — METHOCARBAMOL 500 MG PO TABS
500.0000 mg | ORAL_TABLET | Freq: Four times a day (QID) | ORAL | Status: DC | PRN
  Administered 2021-05-12 (×2): 500 mg via ORAL
  Filled 2021-05-12 (×2): qty 1

## 2021-05-12 MED ORDER — ONDANSETRON HCL 4 MG/2ML IJ SOLN
INTRAMUSCULAR | Status: AC
  Filled 2021-05-12: qty 2

## 2021-05-12 MED ORDER — AMISULPRIDE (ANTIEMETIC) 5 MG/2ML IV SOLN: 10.0000 mg | Freq: Once | INTRAVENOUS | Status: DC | PRN

## 2021-05-12 MED ORDER — OXYCODONE HCL 5 MG/5ML PO SOLN: 5.0000 mg | Freq: Once | ORAL | Status: AC | PRN

## 2021-05-12 MED ORDER — EPHEDRINE SULFATE (PRESSORS) 50 MG/ML IJ SOLN
INTRAMUSCULAR | Status: DC | PRN
  Administered 2021-05-12: 10 mg via INTRAVENOUS
  Administered 2021-05-12: 5 mg via INTRAVENOUS

## 2021-05-12 MED ORDER — DIPHENHYDRAMINE HCL 50 MG/ML IJ SOLN: 12.5000 mg | Freq: Four times a day (QID) | INTRAMUSCULAR | Status: DC | PRN

## 2021-05-12 MED ORDER — EPHEDRINE 5 MG/ML INJ
INTRAVENOUS | Status: AC
  Filled 2021-05-12: qty 5

## 2021-05-12 MED ORDER — ACETAMINOPHEN 325 MG PO TABS
325.0000 mg | ORAL_TABLET | Freq: Four times a day (QID) | ORAL | Status: DC
  Administered 2021-05-13: 325 mg via ORAL
  Filled 2021-05-12: qty 1

## 2021-05-12 MED ORDER — ATROPINE SULFATE 0.4 MG/ML IV SOLN
INTRAVENOUS | Status: AC
  Filled 2021-05-12: qty 1

## 2021-05-12 MED ORDER — FENTANYL CITRATE (PF) 100 MCG/2ML IJ SOLN
INTRAMUSCULAR | Status: DC | PRN
  Administered 2021-05-12: 50 ug via INTRAVENOUS
  Administered 2021-05-12: 25 ug via INTRAVENOUS
  Administered 2021-05-12 (×2): 50 ug via INTRAVENOUS
  Administered 2021-05-12: 25 ug via INTRAVENOUS

## 2021-05-12 MED ORDER — MIDAZOLAM HCL 5 MG/5ML IJ SOLN
INTRAMUSCULAR | Status: DC | PRN
  Administered 2021-05-12: 1 mg via INTRAVENOUS

## 2021-05-12 MED ORDER — ACETAMINOPHEN 500 MG PO TABS
ORAL_TABLET | ORAL | Status: AC
  Filled 2021-05-12: qty 2

## 2021-05-12 MED ORDER — MELATONIN 3 MG PO TABS
3.0000 mg | ORAL_TABLET | Freq: Every evening | ORAL | Status: DC | PRN
  Filled 2021-05-12: qty 1

## 2021-05-12 MED ORDER — ROCURONIUM BROMIDE 10 MG/ML (PF) SYRINGE
PREFILLED_SYRINGE | INTRAVENOUS | Status: AC
  Filled 2021-05-12: qty 10

## 2021-05-12 MED ORDER — SODIUM CHLORIDE 0.9 % IV SOLN
INTRAVENOUS | Status: AC
  Filled 2021-05-12: qty 10

## 2021-05-12 MED ORDER — 0.9 % SODIUM CHLORIDE (POUR BTL) OPTIME
TOPICAL | Status: DC | PRN
  Administered 2021-05-12: 400 mL

## 2021-05-12 MED ORDER — LIDOCAINE 2% (20 MG/ML) 5 ML SYRINGE
INTRAMUSCULAR | Status: AC
  Filled 2021-05-12: qty 5

## 2021-05-12 MED ORDER — OXYCODONE HCL 5 MG PO TABS
5.0000 mg | ORAL_TABLET | Freq: Once | ORAL | Status: AC | PRN
  Administered 2021-05-12: 5 mg via ORAL
  Filled 2021-05-12: qty 1

## 2021-05-12 MED ORDER — ONDANSETRON HCL 4 MG/2ML IJ SOLN
4.0000 mg | Freq: Four times a day (QID) | INTRAMUSCULAR | Status: DC | PRN
  Administered 2021-05-12: 4 mg via INTRAVENOUS

## 2021-05-12 MED ORDER — GABAPENTIN 300 MG PO CAPS
300.0000 mg | ORAL_CAPSULE | ORAL | Status: AC
  Administered 2021-05-12: 300 mg via ORAL

## 2021-05-12 MED ORDER — FENTANYL CITRATE (PF) 100 MCG/2ML IJ SOLN
INTRAMUSCULAR | Status: AC
  Filled 2021-05-12: qty 2

## 2021-05-12 MED ORDER — DIPHENHYDRAMINE HCL 12.5 MG/5ML PO ELIX: 12.5000 mg | ORAL_SOLUTION | Freq: Four times a day (QID) | ORAL | Status: DC | PRN

## 2021-05-12 MED ORDER — SODIUM CHLORIDE 0.9 % IV SOLN
INTRAVENOUS | Status: DC | PRN
  Administered 2021-05-12: 500 mL

## 2021-05-12 MED ORDER — ONDANSETRON 4 MG PO TBDP: 4.0000 mg | ORAL_TABLET | Freq: Four times a day (QID) | ORAL | Status: DC | PRN

## 2021-05-12 MED ORDER — DEXAMETHASONE SODIUM PHOSPHATE 10 MG/ML IJ SOLN
INTRAMUSCULAR | Status: AC
  Filled 2021-05-12: qty 1

## 2021-05-12 MED ORDER — BUPIVACAINE-EPINEPHRINE (PF) 0.25% -1:200000 IJ SOLN
INTRAMUSCULAR | Status: DC | PRN
  Administered 2021-05-12 (×2): 30 mL via PERINEURAL

## 2021-05-12 MED ORDER — DIPHENHYDRAMINE HCL 50 MG/ML IJ SOLN
INTRAMUSCULAR | Status: DC | PRN
  Administered 2021-05-12: 6.25 mg via INTRAVENOUS

## 2021-05-12 MED ORDER — SENNA 8.6 MG PO TABS
1.0000 | ORAL_TABLET | Freq: Two times a day (BID) | ORAL | Status: DC
  Administered 2021-05-12: 8.6 mg via ORAL
  Filled 2021-05-12: qty 1

## 2021-05-12 MED ORDER — HYDROCODONE-ACETAMINOPHEN 5-325 MG PO TABS
1.0000 | ORAL_TABLET | ORAL | Status: DC | PRN
  Administered 2021-05-12: 1 via ORAL
  Administered 2021-05-12: 2 via ORAL
  Filled 2021-05-12: qty 2
  Filled 2021-05-12: qty 1

## 2021-05-12 MED ORDER — CEFAZOLIN SODIUM-DEXTROSE 2-4 GM/100ML-% IV SOLN
INTRAVENOUS | Status: AC
  Filled 2021-05-12: qty 100

## 2021-05-12 MED ORDER — ALBUMIN HUMAN 5 % IV SOLN
INTRAVENOUS | Status: AC
  Filled 2021-05-12: qty 250

## 2021-05-12 MED ORDER — CEFAZOLIN SODIUM-DEXTROSE 2-4 GM/100ML-% IV SOLN
2.0000 g | Freq: Three times a day (TID) | INTRAVENOUS | Status: DC
  Administered 2021-05-12 – 2021-05-13 (×2): 2 g via INTRAVENOUS
  Filled 2021-05-12 (×2): qty 100

## 2021-05-12 MED ORDER — DIPHENHYDRAMINE HCL 50 MG/ML IJ SOLN
INTRAMUSCULAR | Status: AC
  Filled 2021-05-12: qty 1

## 2021-05-12 MED ORDER — FENTANYL CITRATE (PF) 100 MCG/2ML IJ SOLN
25.0000 ug | INTRAMUSCULAR | Status: DC | PRN
  Administered 2021-05-12: 50 ug via INTRAVENOUS

## 2021-05-12 MED ORDER — PHENYLEPHRINE 40 MCG/ML (10ML) SYRINGE FOR IV PUSH (FOR BLOOD PRESSURE SUPPORT)
PREFILLED_SYRINGE | INTRAVENOUS | Status: AC
  Filled 2021-05-12: qty 10

## 2021-05-12 MED ORDER — DIAZEPAM 2 MG PO TABS
2.0000 mg | ORAL_TABLET | Freq: Two times a day (BID) | ORAL | Status: DC | PRN
  Administered 2021-05-13: 2 mg via ORAL
  Filled 2021-05-12: qty 1

## 2021-05-12 MED ORDER — HYDROMORPHONE HCL 1 MG/ML IJ SOLN
1.0000 mg | INTRAMUSCULAR | Status: DC | PRN
  Administered 2021-05-12: 1 mg via INTRAVENOUS
  Filled 2021-05-12: qty 1

## 2021-05-12 MED ORDER — ONDANSETRON HCL 4 MG/2ML IJ SOLN
INTRAMUSCULAR | Status: DC | PRN
  Administered 2021-05-12: 4 mg via INTRAVENOUS

## 2021-05-12 MED ORDER — IBUPROFEN 200 MG PO TABS
400.0000 mg | ORAL_TABLET | Freq: Four times a day (QID) | ORAL | Status: DC
  Administered 2021-05-12 – 2021-05-13 (×3): 400 mg via ORAL
  Filled 2021-05-12 (×3): qty 2

## 2021-05-12 MED ORDER — ALPRAZOLAM 0.25 MG PO TABS
0.5000 mg | ORAL_TABLET | Freq: Three times a day (TID) | ORAL | Status: DC | PRN
  Administered 2021-05-12: 0.5 mg via ORAL
  Filled 2021-05-12: qty 2

## 2021-05-12 MED ORDER — MAGTRACE LYMPHATIC TRACER
INTRAMUSCULAR | Status: DC | PRN
  Administered 2021-05-12: 2 mL via INTRAMUSCULAR

## 2021-05-12 MED ORDER — KCL IN DEXTROSE-NACL 20-5-0.45 MEQ/L-%-% IV SOLN
INTRAVENOUS | Status: DC
  Filled 2021-05-12: qty 1000

## 2021-05-12 MED ORDER — SUCCINYLCHOLINE CHLORIDE 200 MG/10ML IV SOSY
PREFILLED_SYRINGE | INTRAVENOUS | Status: AC
  Filled 2021-05-12: qty 10

## 2021-05-12 MED ORDER — POLYETHYLENE GLYCOL 3350 17 G PO PACK: 17.0000 g | PACK | Freq: Every day | ORAL | Status: DC | PRN

## 2021-05-12 MED ORDER — SUGAMMADEX SODIUM 200 MG/2ML IV SOLN
INTRAVENOUS | Status: DC | PRN
  Administered 2021-05-12: 160 mg via INTRAVENOUS

## 2021-05-12 MED ORDER — ACETAMINOPHEN 500 MG PO TABS
1000.0000 mg | ORAL_TABLET | ORAL | Status: AC
  Administered 2021-05-12: 1000 mg via ORAL

## 2021-05-12 MED ORDER — FENTANYL CITRATE (PF) 100 MCG/2ML IJ SOLN
100.0000 ug | Freq: Once | INTRAMUSCULAR | Status: AC
  Administered 2021-05-12: 100 ug via INTRAVENOUS

## 2021-05-12 SURGICAL SUPPLY — 91 items
ADH SKN CLS APL DERMABOND .7 (GAUZE/BANDAGES/DRESSINGS) ×6
APL PRP STRL LF DISP 70% ISPRP (MISCELLANEOUS) ×6
APPLIER CLIP 9.375 MED OPEN (MISCELLANEOUS) ×8
APR CLP MED 9.3 20 MLT OPN (MISCELLANEOUS) ×6
BAG DECANTER FOR FLEXI CONT (MISCELLANEOUS) ×5 IMPLANT
BINDER BREAST LRG (GAUZE/BANDAGES/DRESSINGS) IMPLANT
BINDER BREAST MEDIUM (GAUZE/BANDAGES/DRESSINGS) IMPLANT
BINDER BREAST XLRG (GAUZE/BANDAGES/DRESSINGS) IMPLANT
BINDER BREAST XXLRG (GAUZE/BANDAGES/DRESSINGS) IMPLANT
BIOPATCH RED 1 DISK 7.0 (GAUZE/BANDAGES/DRESSINGS) ×6 IMPLANT
BLADE HEX COATED 2.75 (ELECTRODE) ×4 IMPLANT
BLADE SURG 10 STRL SS (BLADE) ×5 IMPLANT
BLADE SURG 15 STRL LF DISP TIS (BLADE) ×4 IMPLANT
BLADE SURG 15 STRL SS (BLADE) ×4
BNDG GAUZE ELAST 4 BULKY (GAUZE/BANDAGES/DRESSINGS) ×8 IMPLANT
CANISTER SUCT 1200ML W/VALVE (MISCELLANEOUS) ×5 IMPLANT
CHLORAPREP W/TINT 26 (MISCELLANEOUS) ×6 IMPLANT
CLIP APPLIE 9.375 MED OPEN (MISCELLANEOUS) ×4 IMPLANT
COVER BACK TABLE 60X90IN (DRAPES) ×5 IMPLANT
COVER MAYO STAND STRL (DRAPES) ×6 IMPLANT
COVER PROBE W GEL 5X96 (DRAPES) ×5 IMPLANT
DERMABOND ADVANCED (GAUZE/BANDAGES/DRESSINGS) ×2
DERMABOND ADVANCED .7 DNX12 (GAUZE/BANDAGES/DRESSINGS) ×4 IMPLANT
DEVICE DSSCT PLSMBLD 3.0S LGHT (MISCELLANEOUS) ×4 IMPLANT
DRAIN CHANNEL 19F RND (DRAIN) ×6 IMPLANT
DRAPE LAPAROSCOPIC ABDOMINAL (DRAPES) ×5 IMPLANT
DRAPE UTILITY XL STRL (DRAPES) ×5 IMPLANT
DRSG OPSITE POSTOP 4X6 (GAUZE/BANDAGES/DRESSINGS) ×2 IMPLANT
DRSG PAD ABDOMINAL 8X10 ST (GAUZE/BANDAGES/DRESSINGS) ×10 IMPLANT
DRSG TEGADERM 2-3/8X2-3/4 SM (GAUZE/BANDAGES/DRESSINGS) ×6 IMPLANT
ELECT BLADE 4.0 EZ CLEAN MEGAD (MISCELLANEOUS) ×4
ELECT COATED BLADE 2.86 ST (ELECTRODE) ×1 IMPLANT
ELECT REM PT RETURN 9FT ADLT (ELECTROSURGICAL) ×8
ELECTRODE BLDE 4.0 EZ CLN MEGD (MISCELLANEOUS) ×4 IMPLANT
ELECTRODE REM PT RTRN 9FT ADLT (ELECTROSURGICAL) ×4 IMPLANT
EVACUATOR SILICONE 100CC (DRAIN) ×6 IMPLANT
FUNNEL KELLER 2 DISP (MISCELLANEOUS) IMPLANT
GAUZE SPONGE 4X4 12PLY STRL LF (GAUZE/BANDAGES/DRESSINGS) ×5 IMPLANT
GLOVE SURG ENC MOIS LTX SZ6.5 (GLOVE) ×10 IMPLANT
GLOVE SURG ENC MOIS LTX SZ7.5 (GLOVE) ×5 IMPLANT
GLOVE SURG UNDER POLY LF SZ6.5 (GLOVE) ×1 IMPLANT
GOWN STRL REUS W/ TWL LRG LVL3 (GOWN DISPOSABLE) ×12 IMPLANT
GOWN STRL REUS W/TWL LRG LVL3 (GOWN DISPOSABLE) ×12
GRAFT FLEX HD 6X16 PLIABLE (Tissue) ×2 IMPLANT
HEMOSTAT ARISTA ABSORB 3G PWDR (HEMOSTASIS) ×2 IMPLANT
ILLUMINATOR WAVEGUIDE N/F (MISCELLANEOUS) IMPLANT
IMPL EXPANDER BREAST 455CC (Breast) IMPLANT
IMPLANT BREAST 455CC (Breast) ×2 IMPLANT
IMPLANT EXPANDER BREAST 455CC (Breast) ×6 IMPLANT
IV NS 1000ML (IV SOLUTION)
IV NS 1000ML BAXH (IV SOLUTION) IMPLANT
IV NS 500ML (IV SOLUTION) ×4
IV NS 500ML BAXH (IV SOLUTION) ×4 IMPLANT
KIT FILL ASEPTIC TRANSFER (MISCELLANEOUS) ×2 IMPLANT
KIT FILL SYSTEM UNIVERSAL (SET/KITS/TRAYS/PACK) ×1 IMPLANT
KIT MARKER MARGIN INK (KITS) ×1 IMPLANT
LIGHT WAVEGUIDE WIDE FLAT (MISCELLANEOUS) ×1 IMPLANT
NDL HYPO 25X1 1.5 SAFETY (NEEDLE) ×4 IMPLANT
NEEDLE HYPO 25X1 1.5 SAFETY (NEEDLE) IMPLANT
NS IRRIG 1000ML POUR BTL (IV SOLUTION) ×5 IMPLANT
PACK BASIN DAY SURGERY FS (CUSTOM PROCEDURE TRAY) ×5 IMPLANT
PAD FOAM SILICONE BACKED (GAUZE/BANDAGES/DRESSINGS) IMPLANT
PENCIL SMOKE EVACUATOR (MISCELLANEOUS) ×5 IMPLANT
PIN SAFETY STERILE (MISCELLANEOUS) ×5 IMPLANT
PLASMABLADE 3.0S W/LIGHT (MISCELLANEOUS) ×4
SLEEVE SCD COMPRESS KNEE MED (STOCKING) ×5 IMPLANT
SPIKE FLUID TRANSFER (MISCELLANEOUS) IMPLANT
SPONGE T-LAP 18X18 ~~LOC~~+RFID (SPONGE) ×10 IMPLANT
STRIP SUTURE WOUND CLOSURE 1/2 (MISCELLANEOUS) ×2 IMPLANT
SUT ETHILON 2 0 FS 18 (SUTURE) ×4 IMPLANT
SUT MNCRL AB 4-0 PS2 18 (SUTURE) ×6 IMPLANT
SUT MON AB 3-0 SH 27 (SUTURE) ×4
SUT MON AB 3-0 SH27 (SUTURE) ×4 IMPLANT
SUT MON AB 5-0 PS2 18 (SUTURE) IMPLANT
SUT PDS 3-0 CT2 (SUTURE) ×8
SUT PDS AB 2-0 CT2 27 (SUTURE) ×6 IMPLANT
SUT PDS II 3-0 CT2 27 ABS (SUTURE) IMPLANT
SUT SILK 2 0 SH (SUTURE) IMPLANT
SUT SILK 3 0 PS 1 (SUTURE) ×6 IMPLANT
SUT VIC AB 3-0 SH 27 (SUTURE)
SUT VIC AB 3-0 SH 27X BRD (SUTURE) IMPLANT
SUT VICRYL 3-0 CR8 SH (SUTURE) ×5 IMPLANT
SYR BULB IRRIG 60ML STRL (SYRINGE) ×5 IMPLANT
SYR CONTROL 10ML LL (SYRINGE) ×4 IMPLANT
TOWEL GREEN STERILE FF (TOWEL DISPOSABLE) ×10 IMPLANT
TRACER MAGTRACE VIAL (MISCELLANEOUS) ×1 IMPLANT
TRAY DSU PREP LF (CUSTOM PROCEDURE TRAY) ×4 IMPLANT
TRAY FOLEY W/BAG SLVR 14FR LF (SET/KITS/TRAYS/PACK) IMPLANT
TUBE CONNECTING 20X1/4 (TUBING) ×5 IMPLANT
UNDERPAD 30X36 HEAVY ABSORB (UNDERPADS AND DIAPERS) ×10 IMPLANT
YANKAUER SUCT BULB TIP NO VENT (SUCTIONS) ×5 IMPLANT

## 2021-05-12 NOTE — Anesthesia Procedure Notes (Signed)
Anesthesia Regional Block: Pectoralis block  ? ?Pre-Anesthetic Checklist: , timeout performed,  Correct Patient, Correct Site, Correct Laterality,  Correct Procedure, Correct Position, site marked,  Risks and benefits discussed,  Surgical consent,  Pre-op evaluation,  At surgeon's request and post-op pain management ? ?Laterality: Right ? ?Prep: chloraprep     ?  ?Needles:  ?Injection technique: Single-shot ? ?Needle Type: Echogenic Stimulator Needle   ? ? ?Needle Length: 9cm  ?Needle Gauge: 21  ? ? ? ?Additional Needles: ? ? ?Procedures:,,,, ultrasound used (permanent image in chart),,    ?Narrative:  ?Start time: 05/12/2021 8:40 AM ?End time: 05/12/2021 8:50 AM ?Injection made incrementally with aspirations every 5 mL. ? ?Performed by: Personally  ?Anesthesiologist: Murvin Natal, MD ? ?Additional Notes: ?Functioning IV was confirmed and monitors were applied.  A timeout was performed. Sterile prep, hand hygiene and sterile gloves were used. A 69m 21ga Arrow echogenic stimulator needle was used. Negative aspiration and negative test dose prior to incremental administration of local anesthetic. The patient tolerated the procedure well. ? ?Ultrasound guidance: relevent anatomy identified, needle position confirmed, local anesthetic spread visualized around nerve(s), vascular puncture avoided.  Image printed for medical record.  ? ? ? ? ? ?

## 2021-05-12 NOTE — Anesthesia Postprocedure Evaluation (Signed)
Anesthesia Post Note ? ?Patient: Haley Evans ? ?Procedure(s) Performed: LEFT NIPPLE SPARING MASTECTOMY WITH LEFT SENTINEL LYMPH NODE BIOPSY (Left: Breast) ?RIGHT NIPPLE SPARING MASTECTOMY (Right: Breast) ?IMMEDIATE BILATERAL BREAST RECONSTRUCTION WITH PLACEMENT OF TISSUE EXPANDERS AND FLEX HD (Bilateral: Breast) ? ?  ? ?Patient location during evaluation: PACU ?Anesthesia Type: Regional and General ?Level of consciousness: awake ?Pain management: pain level controlled ?Vital Signs Assessment: post-procedure vital signs reviewed and stable ?Respiratory status: spontaneous breathing, nonlabored ventilation, respiratory function stable and patient connected to nasal cannula oxygen ?Cardiovascular status: blood pressure returned to baseline and stable ?Postop Assessment: no apparent nausea or vomiting ?Anesthetic complications: no ? ? ?No notable events documented. ? ?Last Vitals:  ?Vitals:  ? 05/12/21 1515 05/12/21 1540  ?BP: 131/83 126/85  ?Pulse: 98 100  ?Resp: 18 18  ?Temp:  36.9 ?C  ?SpO2: 98% 100%  ?  ?Last Pain:  ?Vitals:  ? 05/12/21 1540  ?TempSrc:   ?PainSc: 5   ? ? ?  ?  ?  ?  ?  ?  ? ?Aanvi Voyles P Mackie Goon ? ? ? ? ?

## 2021-05-12 NOTE — Anesthesia Preprocedure Evaluation (Addendum)
Anesthesia Evaluation  ?Patient identified by MRN, date of birth, ID band ?Patient awake ? ? ? ?Reviewed: ?Allergy & Precautions, NPO status , Patient's Chart, lab work & pertinent test results ? ?Airway ?Mallampati: II ? ?TM Distance: >3 FB ?Neck ROM: Full ? ? ? Dental ?no notable dental hx. ? ?  ?Pulmonary ?former smoker,  ?  ?Pulmonary exam normal ? ? ? ? ? ? ? Cardiovascular ?negative cardio ROS ?Normal cardiovascular exam ? ? ?  ?Neuro/Psych ?PSYCHIATRIC DISORDERS Anxiety Depression Bipolar Disorder negative neurological ROS ?   ? GI/Hepatic ?negative GI ROS, Neg liver ROS,   ?Endo/Other  ?negative endocrine ROS ? Renal/GU ?negative Renal ROS  ? ?  ?Musculoskeletal ?negative musculoskeletal ROS ?(+)  ? Abdominal ?  ?Peds ? Hematology ?negative hematology ROS ?(+)   ?Anesthesia Other Findings ?LEFT BREAST CANCER ? Reproductive/Obstetrics ? ?  ? ? ? ? ? ? ? ? ? ? ? ? ? ?  ?  ? ? ? ? ? ? ? ?Anesthesia Physical ?Anesthesia Plan ? ?ASA: 2 ? ?Anesthesia Plan: General and Regional  ? ?Post-op Pain Management:   ? ?Induction: Intravenous ? ?PONV Risk Score and Plan: 3 and Ondansetron, Dexamethasone, Midazolam and Treatment may vary due to age or medical condition ? ?Airway Management Planned: Oral ETT ? ?Additional Equipment:  ? ?Intra-op Plan:  ? ?Post-operative Plan: Extubation in OR ? ?Informed Consent: I have reviewed the patients History and Physical, chart, labs and discussed the procedure including the risks, benefits and alternatives for the proposed anesthesia with the patient or authorized representative who has indicated his/her understanding and acceptance.  ? ? ? ?Dental advisory given ? ?Plan Discussed with: CRNA ? ?Anesthesia Plan Comments:   ? ? ? ? ? ?Anesthesia Quick Evaluation ? ?

## 2021-05-12 NOTE — Interval H&P Note (Signed)
History and Physical Interval Note: ? ?05/12/2021 ?10:40 AM ? ?Haley Evans  has presented today for surgery, with the diagnosis of LEFT BREAST CANCER.  The various methods of treatment have been discussed with the patient and family. After consideration of risks, benefits and other options for treatment, the patient has consented to  Procedure(s): ?LEFT NIPPLE SPARING MASTECTOMY WITH LEFT SENTINEL LYMPH NODE BIOPSY (Left) ?RIGHT NIPPLE SPARING MASTECTOMY (Right) ?IMMEDIATE BILATERAL BREAST RECONSTRUCTION WITH PLACEMENT OF TISSUE EXPANDERS AND FLEX HD (Bilateral) as a surgical intervention.  The patient's history has been reviewed, patient examined, no change in status, stable for surgery.  I have reviewed the patient's chart and labs.  Questions were answered to the patient's satisfaction.   ? ? ?Loel Lofty Avaree Gilberti ? ? ?

## 2021-05-12 NOTE — Progress Notes (Signed)
Assisted Dr. Roanna Banning with right, left, ultrasound guided, pectoralis block. Side rails up, monitors on throughout procedure. See vital signs in flow sheet. Tolerated Procedure well. ?

## 2021-05-12 NOTE — Op Note (Signed)
Op report   ? ?DATE OF OPERATION:  05/12/2021 ? ?LOCATION: [facility] ? ?SURGICAL DIVISION: Plastic Surgery ? ?PREOPERATIVE DIAGNOSES:  ?1. Left Breast cancer.   ? ?POSTOPERATIVE DIAGNOSES:  ?1. Left Breast cancer.  ? ?PROCEDURE:  ?1. Bilateral immediate breast reconstruction with placement of Acellular Dermal Matrix and tissue expanders. ? ?SURGEON: Addisyn Leclaire Sanger Chika Cichowski, DO ? ?ASSISTANT:  Lennice Sites, MD ? ?ANESTHESIA:  General.  ? ?COMPLICATIONS: None.  ? ?IMPLANTS: ?Left - Mentor 455 cc. Ref #[id number].  200 cc of injectable saline placed in the expander. ?Right - Mentor 455 cc. Ref #[id number].  250, cc of injectable saline placed in the expander. ?Acellular Dermal Matrix 6 x 16 cm Flex HD x two ? ?INDICATIONS FOR PROCEDURE:  ?The patient, Haley Evans, is a 41 y.o. female born on 07-03-1980, is here for  immediate first stage breast reconstruction with placement of bilateral tissue expander and Acellular dermal matrix. ?MRN: [id number] ? ?CONSENT:  ?Informed consent was obtained directly from the patient. Risks, benefits and alternatives were fully discussed. Specific risks including but not limited to bleeding, infection, hematoma, seroma, scarring, pain, implant infection, implant extrusion, capsular contracture, asymmetry, wound healing problems, and need for further surgery were all discussed. The patient did have an ample opportunity to have her questions answered to her satisfaction.  ? ?DESCRIPTION OF PROCEDURE:  ?The patient was taken to the operating room by the general surgery team. SCDs were placed and IV antibiotics were given. The patient's chest was prepped and draped in a sterile fashion. A time out was performed and the implants to be used were identified.  Bilateral mastectomies were performed.  Once the general surgery team had completed their portion of the case the patient was rendered to the plastic and reconstructive surgery team. ? ?Right:  The  pectoralis major muscle was lifted from the chest wall with release of the lateral edge and lateral inframammary fold.  The pocket was irrigated with antibiotic solution and hemostasis was achieved with electrocautery.  The ADM was then prepared according to the manufacture guidelines and slits placed to help with postoperative fluid management.  The ADM was then sutured to the inferior and lateral edge of the inframammary fold with 2-0 PDS starting with an interrupted stitch and then a running stitch.  The lateral portion was sutured to with interrupted sutures after the expander was placed.  The expander was prepared according to the manufacture guidelines, the air evacuated and then it was placed under the ADM and pectoralis major muscle.  The inferior and lateral tabs were used to secure the expander to the chest wall with 2-0 PDS.  The drain was placed at the inframammary fold over the ADM and secured to the skin with 3-0 Silk.  The deep layers were closed with 3-0 Monocryl followed by 4-0 Monocryl.   ? ?Left:  The pectoralis major muscle was lifted from the chest wall with release of the lateral edge and lateral inframammary fold.  The pocket was irrigated with antibiotic solution and hemostasis was achieved with electrocautery.  The ADM was then prepared according to the manufacture guidelines and slits placed to help with postoperative fluid management.  The ADM was then sutured to the inferior and lateral edge of the inframammary fold with 2-0 PDS starting with an interrupted stitch and then a running stitch.  The lateral portion was sutured to with interrupted sutures after the expander was placed.  The expander was prepared according to the manufacture guidelines,  the air evacuated and then it was placed under the ADM and pectoralis major muscle.  The inferior and lateral tabs were used to secure the expander to the chest wall with 2-0 PDS.  The drain was placed at the inframammary fold over the ADM and  secured to the skin with 3-0 Silk.  The deep layers were closed with 3-0 Monocryl followed by 4-0 Monocryl.  The skin was closed with 5-0 Monocryl and then dermabond was applied.  The ABDs and breast binder were placed.  The patient tolerated the procedure well and there were no complications.  The patient was allowed to wake from anesthesia and taken to the recovery room in satisfactory condition.  ? ?Dr. Erin Hearing assisted throughout the case.  He was essential in retraction and counter traction when needed to make the case progress smoothly.  This retraction and assistance made it possible to see the tissue plans for the procedure.  The assistance was needed for blood control, tissue re-approximation and assisted with closure of the incision site. ? ?

## 2021-05-12 NOTE — Transfer of Care (Signed)
Immediate Anesthesia Transfer of Care Note ? ?Patient: Haley Evans ? ?Procedure(s) Performed: LEFT NIPPLE SPARING MASTECTOMY WITH LEFT SENTINEL LYMPH NODE BIOPSY (Left: Breast) ?RIGHT NIPPLE SPARING MASTECTOMY (Right: Breast) ?IMMEDIATE BILATERAL BREAST RECONSTRUCTION WITH PLACEMENT OF TISSUE EXPANDERS AND FLEX HD (Bilateral: Breast) ? ?Patient Location: PACU ? ?Anesthesia Type:GA combined with regional for post-op pain ? ?Level of Consciousness: awake, alert  and oriented ? ?Airway & Oxygen Therapy: Patient Spontanous Breathing and Patient connected to face mask oxygen ? ?Post-op Assessment: Report given to RN and Post -op Vital signs reviewed and stable ? ?Post vital signs: Reviewed and stable ? ?Last Vitals:  ?Vitals Value Taken Time  ?BP 164/87 05/12/21 1430  ?Temp    ?Pulse 110 05/12/21 1431  ?Resp 17 05/12/21 1431  ?SpO2 100 % 05/12/21 1431  ?Vitals shown include unvalidated device data. ? ?Last Pain:  ?Vitals:  ? 05/12/21 0753  ?TempSrc: Oral  ?PainSc: 0-No pain  ?   ? ?Patients Stated Pain Goal: 7 (05/12/21 0753) ? ?Complications: No notable events documented. ?

## 2021-05-12 NOTE — Discharge Instructions (Addendum)
INSTRUCTIONS FOR AFTER BREAST SURGERY   You will likely have some questions about what to expect following your operation.  The following information will help you and your family understand what to expect when you are discharged from the hospital.  Following these guidelines will help ensure a smooth recovery and reduce risks of complications.  Postoperative instructions include information on: diet, wound care, medications and physical activity.  AFTER SURGERY Expect to go home after the procedure.  In some cases, you may need to spend one night in the hospital for observation.  DIET Breast surgery does not require a specific diet.  However, the healthier you eat the better your body can start healing. It is important to increasing your protein intake.  This means limiting the foods with sugar and carbohydrates.  Focus on vegetables and some meat.  If you have any liposuction during your procedure be sure to drink water.  If your urine is bright yellow, then it is concentrated, and you need to drink more water.  As a general rule after surgery, you should have 8 ounces of water every hour while awake.  If you find you are persistently nauseated or unable to take in liquids let us know.  NO TOBACCO USE or EXPOSURE.  This will slow your healing process and increase the risk of a wound.  WOUND CARE Leave the ACE wrap or binder on for 3 days . Use fragrance free soap.   After 3 days you can remove the ACE wrap or binder to shower. Once dry apply ACE wrap, binder or sports bra.  Use a mild soap like Dial, Dove and Ivory. You may have Topifoam or Lipofoam on.  It is soft and spongy and helps keep you from getting creases if you have liposuction.  This can be removed before the shower and then replaced.  If you need more it is available on [employer] (Lipofoam). If you have steri-strips / tape directly attached to your skin leave them in place. It is OK to get these wet.   No baths, pools or hot tubs for four  weeks. We close your incision to leave the smallest and best-looking scar. No ointment or creams on your incisions until given the go ahead.  Especially not Neosporin (Too many skin reactions with this one).  A few weeks after surgery you can use Mederma and start massaging the scar. We ask you to wear your binder or sports bra for the first 6 weeks around the clock, including while sleeping. This provides added comfort and helps reduce the fluid accumulation at the surgery site.  ACTIVITY No heavy lifting until cleared by the doctor.  This usually means no more than a half-gallon of milk.  It is OK to walk and climb stairs. In fact, moving your legs is very important to decrease your risk of a blood clot.  It will also help keep you from getting deconditioned.  Every 1 to 2 hours get up and walk for 5 minutes. This will help with a quicker recovery back to normal.  Let pain be your guide so you don't do too much.  This is not the time for spring cleaning and don't plan on taking care of anyone else.  This time is for you to recover,  You will be more comfortable if you sleep and rest with your head elevated either with a few pillows under you or in a recliner.  No stomach sleeping for a three months.  WORK Everyone   returns to work at different times. As a rough guide, most people take at least 1 - 2 weeks off prior to returning to work. If you need documentation for your job, bring the forms to your postoperative follow up visit. ? ?DRIVING ?Arrange for someone to bring you home from the hospital.  You may be able to drive a few days after surgery but not while taking any narcotics or valium. ? ?BOWEL MOVEMENTS ?Constipation can occur after anesthesia and while taking pain medication.  It is important to stay ahead for your comfort.  We recommend taking Milk of Magnesia (2 tablespoons; twice a day) while taking the pain pills. ? ?MEDICATIONS You may be prescribed should start after surgery ?At your  preoperative visit for you history and physical you may have been given the following medications: ?An antibiotic: Start this medication when you get home and take according to the instructions on the bottle. ?Zofran 4 mg:  This is to treat nausea and vomiting.  You can take this every 6 hours as needed and only if needed. ?Valium 2 mg: This is for muscle tightness if you have an implant or expander. This will help relax your muscle which also helps with pain control.  This can be taken every 12 hours as needed. Don't drive after taking this medication. ?Norco (hydrocodone/acetaminophen) 5/325 mg:  This is only to be used after you have taken the motrin or the tylenol. Every 8 hours as needed. *Last dose of Oxycodone at 9:00 am  ? ? ?Over the counter Medication to take: ?Ibuprofen (Motrin) 600 mg:  Take this every 6 hours.  If you have additional pain then take 500 mg of the tylenol every 8 hours.  Only take the Norco after you have tried these two. ?Miralax or stool softener of choice: Take this according to the bottle if you take the Norco. ? ?WHEN TO CALL ?Call your surgeon's office if any of the following occur: ?Fever 101 degrees F or greater ?Excessive bleeding or fluid from the incision site. ?Pain that increases over time without aid from the medications ?Redness, warmth, or pus draining from incision sites ?Persistent nausea or inability to take in liquids ?Severe misshapen area that underwent the operation. ? ?Here are some resources: ? ?Plastic surgery website: https://www.plasticsurgery.org/for-medical-professionals/education-and-resources/publications/breast-reconstruction-magazine ?Breast Reconstruction Awareness Campaign:  [employer]co[employer]nz ?Plastic surgery Implant information:  https://www.plasticsurgery.org/patient-safety/breast-implant-safety ? ?About my Jackson-Pratt Bulb Drain ? ?What is a Jackson-Pratt bulb? ?A Jackson-Pratt is a soft, round device used to collect drainage. It is  connected to a long, thin drainage catheter, which is held in place by one or two small stiches near your surgical incision site. When the bulb is squeezed, it forms a vacuum, forcing the drainage to empty into the bulb. ? ?Emptying the Jackson-Pratt bulb- ?To empty the bulb: ?1. Release the plug on the top of the bulb. ?2. Pour the bulb's contents into a measuring container which your nurse will provide. ?3. Record the time emptied and amount of drainage. Empty the drain(s) as often as your     doctor or nurse recommends. ? ?Date                  Time                    Amount (Drain 1)                 Amount (Drain 2) ? ?____________________________________________________________________________________________ ? ?______________________________________________________________________________________________ ? ?______________________________________________________________________________________________ ? ?______________________________________________________________________________________________ ? ?______________________________________________________________________________________________ ? ?  ______________________________________________________________________________________________ ? ?______________________________________________________________________________________________ ? ?______________________________________________________________________________________________ ? ?Squeezing the Jackson-Pratt Bulb- ?To squeeze the bulb: ?1. Make sure the plug at the top of the bulb is open. ?2. Squeeze the bulb tightly in your fist. You will hear air squeezing from the bulb. ?3. Replace the plug while the bulb is squeezed. ?4. Use a safety pin to attach the bulb to your clothing. This will keep the catheter from     pulling at the bulb insertion site. ? ?When to call your doctor- ?Call your doctor if: ?Drain site becomes red, swollen or hot. ?You have a fever greater than 101 degrees F. ?There is oozing at  the drain site. ?Drain falls out (apply a guaze bandage over the drain hole and secure it with tape). ?Drainage increases daily not related to activity patterns. (You will usually have more drainage when you a

## 2021-05-12 NOTE — Anesthesia Procedure Notes (Signed)
Procedure Name: Intubation ?Date/Time: 05/12/2021 9:37 AM ?Performed by: Willa Frater, CRNA ?Pre-anesthesia Checklist: Patient identified, Emergency Drugs available, Suction available and Patient being monitored ?Patient Re-evaluated:Patient Re-evaluated prior to induction ?Oxygen Delivery Method: Circle system utilized ?Preoxygenation: Pre-oxygenation with 100% oxygen ?Induction Type: IV induction ?Ventilation: Mask ventilation without difficulty ?Laryngoscope Size: Mac and 3 ?Grade View: Grade I ?Tube type: Oral ?Number of attempts: 1 ?Airway Equipment and Method: Stylet and Oral airway ?Placement Confirmation: ETT inserted through vocal cords under direct vision, positive ETCO2 and breath sounds checked- equal and bilateral ?Secured at: 22 cm ?Tube secured with: Tape ?Dental Injury: Teeth and Oropharynx as per pre-operative assessment  ? ? ? ? ?

## 2021-05-12 NOTE — Anesthesia Procedure Notes (Signed)
Anesthesia Regional Block: Pectoralis block  ? ?Pre-Anesthetic Checklist: , timeout performed,  Correct Patient, Correct Site, Correct Laterality,  Correct Procedure, Correct Position, site marked,  Risks and benefits discussed,  Surgical consent,  Pre-op evaluation,  At surgeon's request and post-op pain management ? ?Laterality: Left ? ?Prep: chloraprep     ?  ?Needles:  ?Injection technique: Single-shot ? ?Needle Type: Echogenic Stimulator Needle   ? ? ?Needle Length: 9cm  ?Needle Gauge: 21  ? ? ? ?Additional Needles: ? ? ?Procedures:,,,, ultrasound used (permanent image in chart),,    ?Narrative:  ?Start time: 05/12/2021 8:30 AM ?End time: 05/12/2021 8:40 AM ?Injection made incrementally with aspirations every 5 mL. ? ?Performed by: Personally  ?Anesthesiologist: Murvin Natal, MD ? ?Additional Notes: ?Functioning IV was confirmed and monitors were applied.  A timeout was performed. Sterile prep, hand hygiene and sterile gloves were used. A 10m 21ga Arrow echogenic stimulator needle was used. Negative aspiration and negative test dose prior to incremental administration of local anesthetic. The patient tolerated the procedure well. ? ?Ultrasound guidance: relevent anatomy identified, needle position confirmed, local anesthetic spread visualized around nerve(s), vascular puncture avoided.  Image printed for medical record.  ? ? ? ? ? ?

## 2021-05-12 NOTE — Interval H&P Note (Signed)
History and Physical Interval Note: ? ?05/12/2021 ?2:32 PM ? ?Haley Evans  has presented today for surgery, with the diagnosis of LEFT BREAST CANCER.  The various methods of treatment have been discussed with the patient and family. After consideration of risks, benefits and other options for treatment, the patient has consented to  Procedure(s): ?LEFT NIPPLE SPARING MASTECTOMY WITH LEFT SENTINEL LYMPH NODE BIOPSY (Left) ?RIGHT NIPPLE SPARING MASTECTOMY (Right) ?IMMEDIATE BILATERAL BREAST RECONSTRUCTION WITH PLACEMENT OF TISSUE EXPANDERS AND FLEX HD (Bilateral) as a surgical intervention.  The patient's history has been reviewed, patient examined, no change in status, stable for surgery.  I have reviewed the patient's chart and labs.  Questions were answered to the patient's satisfaction.   ? ? ?Haley Evans ? ? ?

## 2021-05-12 NOTE — Interval H&P Note (Signed)
History and Physical Interval Note: ? ?05/12/2021 ?8:18 AM ? ?Haley Evans  has presented today for surgery, with the diagnosis of LEFT BREAST CANCER.  The various methods of treatment have been discussed with the patient and family. After consideration of risks, benefits and other options for treatment, the patient has consented to  Procedure(s): ?LEFT NIPPLE SPARING MASTECTOMY WITH LEFT SENTINEL LYMPH NODE BIOPSY (Left) ?RIGHT NIPPLE SPARING MASTECTOMY (Right) ?IMMEDIATE BREAST RECONSTRUCTION WITH PLACEMENT OF TISSUE EXPANDERS AND FLEX HD (Bilateral) as a surgical intervention.  The patient's history has been reviewed, patient examined, no change in status, stable for surgery.  I have reviewed the patient's chart and labs.  Questions were answered to the patient's satisfaction.   ? ? ?Autumn Messing III ? ? ?

## 2021-05-12 NOTE — Op Note (Signed)
05/12/2021 ? ?12:40 PM ? ?PATIENT:  Haley Evans  41 y.o. female ? ?PRE-OPERATIVE DIAGNOSIS:  LEFT BREAST CANCER ? ?POST-OPERATIVE DIAGNOSIS:  LEFT BREAST CANCER ? ?PROCEDURE:  Procedure(s): ?LEFT NIPPLE SPARING MASTECTOMY WITH DEEP LEFT AXILLARY SENTINEL LYMPH NODE BIOPSY (Left) ?RIGHT NIPPLE SPARING MASTECTOMY (Right) ? ? ?SURGEON:  Surgeon(s) and Role: ?Panel 1: ?   Jovita Kussmaul, MD - Primary ? ?PHYSICIAN ASSISTANT:  ? ?ASSISTANTS: none  ? ?ANESTHESIA:   general ? ?EBL:  45 mL  ? ?BLOOD ADMINISTERED:none ? ?DRAINS: none  ? ?LOCAL MEDICATIONS USED:  NONE ? ?SPECIMEN:  Source of Specimen:  right nipple sparing mastectomy, left nipple sparing mastectomy with sentinel nodes x 3 ? ?DISPOSITION OF SPECIMEN:  PATHOLOGY ? ?COUNTS:  YES ? ?TOURNIQUET:  * No tourniquets in log * ? ?DICTATION: .Dragon Dictation ? ?After informed consent was obtained the patient was brought to the operating room and placed in the supine position on the operating table.  After adequate induction of general anesthesia the patient's bilateral chest, breast, and axillary areas were prepped with ChloraPrep, allowed to dry, and draped in usual sterile manner.  An appropriate timeout was performed.  At this point 2 cc of iron oxide were injected in the subareolar plexus on the left.  Attention was then first turned to the right breast.  An inframammary fold incision was made with a 10 blade knife.  The incision was carried through the skin and subcutaneous tissue sharply with the PlasmaBlade.  The dissection was carried all the way to the chest wall.  Dissection was then carried superiorly between the breast tissue and the chest wall muscles sharply with the PlasmaBlade until the dissection reached the superior pole of the breast.  Next the dissection was carried anteriorly between the skin and the subcutaneous tissue and the breast tissue.  Again this dissection was carried all the way to the lateral and superior pole of the breast.  When  the dissection reached the area of the nipple I did remove a small piece of tissue from the nipple area and sent this for frozen section.  The pathologist could not definitively say if there was anything there.  The nipple was marked with a stitch on the breast tissue.  Once the dissection posteriorly and anteriorly met each other I was then able to remove the entire right breast.  It was marked with the appropriate paint colors.  The breast was then sent to pathology for further evaluation.  Hemostasis was achieved using the PlasmaBlade.  The cavity was packed with a moistened lap sponge.  Attention was then turned to the left breast.  A similar inframammary fold incision was made with a 10 blade knife.  The incision was carried through the skin and subcutaneous tissue sharply with the PlasmaBlade until the dissection reached the chest wall.  The dissection was then carried superiorly from here between the breast tissue and the muscles of the chest wall until the dissection reached the superior pole of the breast.  Next the dissection of the left breast was carried anteriorly between the breast tissue and the subcutaneous fat and skin.  Once the dissection reached the area of the nipple I did remove a piece of tissue from the nipple that was sent for frozen section and the pathologist could not definitively say if there was anything there.  The nipple area was marked with a stitch on the breast.  The dissection was then carried superiorly until the dissection reached the  superior pole of the breast and met the posterior dissection.  At that point I was able to remove the entire left breast.  It was marked with the appropriate paint colors.  The breast was then sent to pathology for further evaluation.  The cancer was palpable in the lateral tail of the left breast and was very close to the skin and chest wall but felt like we got a very good margin on it and shaved it off of the skin and muscle.  At this point the  mag trace was used to identify a signal in the left axilla.  I was able to identify 2 lymph nodes with signal and 1 palpable lymph node.  Each of these nodes was excised sharply with the PlasmaBlade and the surrounding small vessels and lymphatics were controlled with clips.  These were sent as sentinel nodes numbers 1 through 3.  No other hot or palpable nodes were identified in the left axilla.  Hemostasis was achieved using the PlasmaBlade.  The wound was packed with a moistened lap sponge.  The skin flaps were healthy.  The patient tolerated this portion of the procedure well.  All needle sponge and instrument counts were correct.  The patient was in stable condition.  The operation was then turned over to Dr. Marla Roe for the reconstruction.  Her portion will be dictated separately. ? ?PLAN OF CARE: Admit for overnight observation ? ?PATIENT DISPOSITION:  PACU - hemodynamically stable. ?  ?Delay start of Pharmacological VTE agent (>24hrs) due to surgical blood loss or risk of bleeding: no ? ?

## 2021-05-12 NOTE — H&P (Signed)
?PROVIDER: Landry Corporal, MD ? ?MRN: [id number] ?DOB: 1980-04-28 ?Subjective  ? ?Chief Complaint: Breast Cancer ? ? ?History of Present Illness: ?Haley Evans is a 41 y.o. female who is seen today as an office consultation at the request of Dr. Pasty Arch for evaluation of Breast Cancer ?.  ? ?We are asked to see the patient in consultation by Dr. Lindi Adie to evaluate her for a new left breast cancer. The patient is a 41 year old white female who recently went for her first screening mammogram. At that time she was found to have several areas of calcification and asymmetry in both breasts. All of this turned out to look benign except for a 1.5 cm mass in the upper outer quadrant of the left breast. The axilla looked negative. The mass was biopsied and came back as a grade 2 invasive ductal cancer that was ER and PR positive and HER2 negative with a Ki-67 of 1%. She is otherwise in pretty good health but does smoke. ? ?Review of Systems: ?A complete review of systems was obtained from the patient. I have reviewed this information and discussed as appropriate with the patient. See HPI as well for other ROS. ? ?ROS  ? ?Medical History: ?Past Medical History:  ?Diagnosis Date  ? Anxiety  ? History of cancer  ? Seizures (CMS-HCC)  ? ?Patient Active Problem List  ?Diagnosis  ? Malignant neoplasm of upper-outer quadrant of left breast in female, estrogen receptor positive (CMS-HCC)  ? Cervical intraepithelial neoplasia grade 1  ? Anxiety and depression  ? Smoker  ? Chills  ? Easy bruising  ? Bleeds easily (CMS-HCC)  ? Seizures (CMS-HCC)  ? Nervousness  ? ?Past Surgical History:  ?Procedure Laterality Date  ? CESAREAN SECTION  ?Unknown date  ? ? ?Allergies  ?Allergen Reactions  ? Morphine Itching  ? ?Current Outpatient Medications on File Prior to Visit  ?Medication Sig Dispense Refill  ? ALPRAZolam (XANAX) 0.5 MG tablet Take 0.5 mg by mouth once daily as needed  ? ascorbic acid, vitamin C, (VITAMIN C) 500 MG tablet Take 1  tablet by mouth once daily  ? hydrOXYzine (ATARAX) 50 MG tablet Take 1 tablet by mouth once daily  ? lamoTRIgine (LAMICTAL) 200 MG tablet Take 200 mg by mouth 2 (two) times daily  ? levonorgestreL (MIRENA 52 MG) 20 mcg/24 hr (8 years) IUD Insert 1 each into the uterus once Follow package directions.  ? valACYclovir (VALTREX) 1000 MG tablet Take 1,000 mg by mouth every 12 (twelve) hours  ? ?No current facility-administered medications on file prior to visit.  ? ?Family History  ?Problem Relation Age of Onset  ? Coronary Artery Disease (Blocked arteries around heart) Father  ? High blood pressure (Hypertension) Father  ? ? ?Social History  ? ?Tobacco Use  ?Smoking Status Some Days  ? Types: Cigarettes  ?Smokeless Tobacco Not on file  ? ? ?Social History  ? ?Socioeconomic History  ? Marital status: Unknown  ?Tobacco Use  ? Smoking status: Some Days  ?Types: Cigarettes  ?Vaping Use  ? Vaping Use: Unknown  ?Substance and Sexual Activity  ? Alcohol use: Yes  ?Comment: twice per week  ? Drug use: Defer  ? ?Objective:  ?There were no vitals filed for this visit.  ?There is no height or weight on file to calculate BMI. ? ?Physical Exam ?Vitals reviewed.  ?Constitutional:  ?General: She is not in acute distress. ?Appearance: Normal appearance.  ?HENT:  ?Head: Normocephalic and atraumatic.  ?Right Ear: External ear  normal.  ?Left Ear: External ear normal.  ?Nose: Nose normal.  ?Mouth/Throat:  ?Mouth: Mucous membranes are moist.  ?Pharynx: Oropharynx is clear.  ?Eyes:  ?General: No scleral icterus. ?Extraocular Movements: Extraocular movements intact.  ?Conjunctiva/sclera: Conjunctivae normal.  ?Pupils: Pupils are equal, round, and reactive to light.  ?Cardiovascular:  ?Rate and Rhythm: Normal rate and regular rhythm.  ?Pulses: Normal pulses.  ?Heart sounds: Normal heart sounds.  ?Pulmonary:  ?Effort: Pulmonary effort is normal. No respiratory distress.  ?Breath sounds: Normal breath sounds.  ?Abdominal:  ?General: Bowel sounds  are normal.  ?Palpations: Abdomen is soft.  ?Tenderness: There is no abdominal tenderness.  ?Musculoskeletal:  ?General: No swelling, tenderness or deformity. Normal range of motion.  ?Cervical back: Normal range of motion and neck supple.  ?Skin: ?General: Skin is warm and dry.  ?Coloration: Skin is not jaundiced.  ?Neurological:  ?General: No focal deficit present.  ?Mental Status: She is alert and oriented to person, place, and time.  ?Psychiatric:  ?Mood and Affect: Mood normal.  ?Behavior: Behavior normal.  ? ? ? ?Breast: The patient has very dense breast tissue along the upper portion of both breasts. There also seems to be a palpable bruise or mass in the upper outer quadrant of the left breast. There is no palpable axillary, supraclavicular, or cervical lymphadenopathy. ? ?Labs, Imaging and Diagnostic Testing: ? ?Assessment and Plan:  ? ?Diagnoses and all orders for this visit: ? ?Malignant neoplasm of upper-outer quadrant of left breast in female, estrogen receptor positive (CMS-HCC) ?- Ambulatory Referral to Plastic Surgery ?- Ambulatory Referral to Physical Therapy ?- Ambulatory Referral to Radiation Oncology ?- Ambulatory Referral to Oncology-Medical ?- [facility] Case Posting Request; Future ? ? ? ?The patient appears to have a 1.5 cm cancer in the upper outer quadrant of the left breast with clinically negative nodes and favorable markers. I have discussed with her the different options for treatment and at this point she is undecided between lumpectomy and mastectomy. I will refer her to plastic surgery to discuss reconstructive options. She is likely going to wait on genetics to make her final decision. We will also have her undergo an MRI study given the busy nature of her imaging. She will meet with medical and radiation oncology to discuss adjuvant therapy as well as physical therapy for preoperative lymphedema testing. We will talk again once she has made her final decision and then can proceed with  surgery scheduling  ? ?The patient has elected for bilateral nipple sparing mastectomies and left sentinel node biopsy ?

## 2021-05-13 DIAGNOSIS — C50412 Malignant neoplasm of upper-outer quadrant of left female breast: Secondary | ICD-10-CM | POA: Diagnosis not present

## 2021-05-13 MED ORDER — OXYCODONE HCL 5 MG PO TABS
5.0000 mg | ORAL_TABLET | ORAL | 0 refills | Status: DC | PRN
Start: 2021-05-13 — End: 2021-06-15

## 2021-05-13 MED ORDER — OXYCODONE HCL 5 MG PO TABS
5.0000 mg | ORAL_TABLET | ORAL | Status: DC | PRN
  Administered 2021-05-13: 5 mg via ORAL
  Filled 2021-05-13: qty 1

## 2021-05-16 ENCOUNTER — Encounter: Payer: Self-pay | Admitting: Plastic Surgery

## 2021-05-16 ENCOUNTER — Encounter (HOSPITAL_BASED_OUTPATIENT_CLINIC_OR_DEPARTMENT_OTHER): Payer: Self-pay | Admitting: General Surgery

## 2021-05-16 LAB — SURGICAL PATHOLOGY

## 2021-05-17 ENCOUNTER — Encounter: Payer: Self-pay | Admitting: *Deleted

## 2021-05-17 ENCOUNTER — Encounter: Payer: Self-pay | Admitting: Plastic Surgery

## 2021-05-18 NOTE — Discharge Summary (Signed)
Physician Discharge Summary  ?Patient ID: ?Haley Evans ?MRN: [id number] ?DOB/AGE: [birth year] 41 y.o. ? ?Admit date: 05/12/2021 ?Discharge date: 05/18/2021 ? ?Admission Diagnoses: ? ?Discharge Diagnoses:  ?Principal Problem: ?  Breast cancer ([place]) ? ? ?Discharged Condition: good ? ?Hospital Course: the pt underwent bilateral nipple sparing mastectomies. She tolerated surgery well. On pod 1 she was ready for d/c home ? ?Consults: None ? ?Significant Diagnostic Studies: none ? ?Treatments: surgery: as above ? ?Discharge Exam: ?Blood pressure 110/79, pulse 87, temperature 98.2 ?F (36.8 ?C), resp. rate 16, height '5\' 9"'$  (1.753 m), weight 73.2 kg, last menstrual period 04/27/2021, SpO2 100 %. ?Chest wall: skin flaps ok ? ?Disposition:  ? ?Discharge Instructions   ? ? Call MD for:  difficulty breathing, headache or visual disturbances   Complete by: As directed ?  ? Call MD for:  extreme fatigue   Complete by: As directed ?  ? Call MD for:  hives   Complete by: As directed ?  ? Call MD for:  persistant dizziness or light-headedness   Complete by: As directed ?  ? Call MD for:  persistant nausea and vomiting   Complete by: As directed ?  ? Call MD for:  redness, tenderness, or signs of infection (pain, swelling, redness, odor or green/yellow discharge around incision site)   Complete by: As directed ?  ? Call MD for:  severe uncontrolled pain   Complete by: As directed ?  ? Call MD for:  temperature >100.4   Complete by: As directed ?  ? Diet - low sodium heart healthy   Complete by: As directed ?  ? Increase activity slowly   Complete by: As directed ?  ? ?  ? ?Allergies as of 05/13/2021   ? ?   Reactions  ? Morphine Itching  ? ?  ? ?  ?Medication List  ?  ? ?TAKE these medications   ? ?ALPRAZolam 0.5 MG tablet ?Commonly known as: Duanne Moron ?TAKE 1 TABLET BY MOUTH TWICE DAILY AS NEEDED FOR ANXIETY ?  ?diazepam 2 MG tablet ?Commonly known as: Valium ?Take 1 tablet (2 mg total) by mouth every 12 (twelve) hours as needed for  muscle spasms. ?Notes to patient: You had your last dose at 8:34 am ?  ?hydrOXYzine 25 MG tablet ?Commonly known as: ATARAX ?Take 25 mg by mouth at bedtime. ?  ?multivitamin tablet ?Take 1 tablet by mouth daily. ?  ?ondansetron 4 MG tablet ?Commonly known as: Zofran ?Take 1 tablet (4 mg total) by mouth every 8 (eight) hours as needed for nausea or vomiting. ?  ?oxyCODONE 5 MG immediate release tablet ?Commonly known as: Roxicodone ?Take 1 tablet (5 mg total) by mouth every 4 (four) hours as needed for severe pain. ?  ?SUPER B COMPLEX PO ?Take by mouth daily. ?  ?tamoxifen 10 MG tablet ?Commonly known as: NOLVADEX ?Take 1 tablet (10 mg total) by mouth daily. ?  ?valACYclovir 1000 MG tablet ?Commonly known as: VALTREX ?  ?varenicline 1 MG tablet ?Commonly known as: CHANTIX ?Take 1 mg by mouth 2 (two) times daily. ?  ?VITAMIN E PO ?Take by mouth daily. ?  ? ?  ? ? Follow-up Information   ? ? Dillingham, Loel Lofty, DO Follow up in 10 day(s).   ?Specialty: Plastic Surgery ?Contact information: ?[address] ?[place] [address] ?[phone] ? ? ?  ?  ? ?  ?  ? ?  ? ? ?Signed: ?Autumn Messing III ?05/18/2021, 3:54 PM ? ? ?

## 2021-05-19 ENCOUNTER — Encounter: Payer: Self-pay | Admitting: Physical Therapy

## 2021-05-20 ENCOUNTER — Encounter: Payer: Self-pay | Admitting: Plastic Surgery

## 2021-05-20 ENCOUNTER — Ambulatory Visit (INDEPENDENT_AMBULATORY_CARE_PROVIDER_SITE_OTHER): Payer: BC Managed Care – PPO | Admitting: Plastic Surgery

## 2021-05-20 ENCOUNTER — Other Ambulatory Visit: Payer: Self-pay

## 2021-05-20 DIAGNOSIS — Z170 Estrogen receptor positive status [ER+]: Secondary | ICD-10-CM

## 2021-05-20 DIAGNOSIS — C50412 Malignant neoplasm of upper-outer quadrant of left female breast: Secondary | ICD-10-CM

## 2021-05-20 NOTE — Progress Notes (Signed)
? ?  Subjective:  ? ? Patient ID: Haley Evans, female    DOB: 03-16-1980, 41 y.o.   MRN: [id number] ? ?HPI ?The patient is a 41 year old female here for follow-up after undergoing bilateral mastectomies on 05/12/2021.  Today she has some discoloration of the lower pole of her flaps.  Her nipples are dusky looking as well.  This is concerning.  I did not fill the expanders any today. ? ? ?Review of Systems  ?Constitutional: Negative.   ?Eyes: Negative.   ?Respiratory: Negative.    ?Cardiovascular: Negative.   ?Gastrointestinal: Negative.   ?Genitourinary: Negative.   ? ?   ?Objective:  ? Physical Exam ?Constitutional:   ?   Appearance: Normal appearance.  ?Cardiovascular:  ?   Rate and Rhythm: Normal rate.  ?   Pulses: Normal pulses.  ?Neurological:  ?   Mental Status: She is alert and oriented to person, place, and time.  ? ? ? ?   ?Assessment & Plan:  ? ?  ICD-10-CM   ?1. Malignant neoplasm of upper-outer quadrant of left breast in female, estrogen receptor positive ([place])  C50.412   ? Z17.0   ?  ?  ? ? ?I have encouraged the patient to continue healthy eating.  Of course no smoking as she has quit.  We may have to do a revision early next week.  I do want to see her on Tuesday. ? ?Pictures were obtained of the patient and placed in the chart with the patient's or guardian's permission. ? ? ?

## 2021-05-20 NOTE — Progress Notes (Signed)
? ?Patient Care Team: ?Berkley Harvey, NP as PCP - General (Nurse Practitioner) ?Mauro Kaufmann, RN as Oncology Nurse Navigator ?Rockwell Germany, RN as Oncology Nurse Navigator ?Jovita Kussmaul, MD as Consulting Physician (General Surgery) ?Nicholas Lose, MD as Consulting Physician (Hematology and Oncology) ?Gery Pray, MD as Consulting Physician (Radiation Oncology) ? ?DIAGNOSIS:  ?Encounter Diagnosis  ?Name Primary?  ? Malignant neoplasm of upper-outer quadrant of left breast in female, estrogen receptor positive ([place])   ? ? ?SUMMARY OF ONCOLOGIC HISTORY: ?Oncology History  ?Malignant neoplasm of upper-outer quadrant of left breast in female, estrogen receptor positive ([place])  ?03/01/2021 Initial Diagnosis  ? Screening mammogram detected left breast mass posteriorly at the axillary tail measuring 1.5 cm: Biopsy: Grade 2 IDC ER 95%, PR 95%, HER2 negative, Ki-67 1%, left breast calcifications 0.8 cm biopsy: Benign, left breast asymmetry?  Axilla negative ?  ?03/09/2021 Cancer Staging  ? Staging form: Breast, AJCC 8th Edition ?- Clinical stage from 03/09/2021: Stage IA (cT1c, cN0, cM0, G2, ER+, PR+, HER2-) - Signed by Nicholas Lose, MD on 03/09/2021 ?Stage prefix: Initial diagnosis ?Histologic grading system: 3 grade system ? ?  ? Genetic Testing  ? [facility] CancerNext-Expanded Panel is Negative. Report date was 03/21/2021. ? ?The CancerNext-Expanded gene panel offered by [facility] and includes sequencing, rearrangement, and RNA analysis for the following 77 genes: AIP, ALK, APC, ATM, AXIN2, BAP1, BARD1, BLM, BMPR1A, BRCA1, BRCA2, BRIP1, CDC73, CDH1, CDK4, CDKN1B, CDKN2A, CHEK2, CTNNA1, DICER1, FANCC, FH, FLCN, GALNT12, KIF1B, LZTR1, MAX, MEN1, MET, MLH1, MSH2, MSH3, MSH6, MUTYH, NBN, NF1, NF2, NTHL1, PALB2, PHOX2B, PMS2, POT1, PRKAR1A, PTCH1, PTEN, RAD51C, RAD51D, RB1, RECQL, RET, SDHA, SDHAF2, SDHB, SDHC, SDHD, SMAD4, SMARCA4, SMARCB1, SMARCE1, STK11, SUFU, TMEM127, TP53, TSC1, TSC2, VHL and XRCC2 (sequencing and  deletion/duplication); EGFR, EGLN1, HOXB13, KIT, MITF, PDGFRA, POLD1, and POLE (sequencing only); EPCAM and GREM1 (deletion/duplication only).  ?  ?05/12/2021 Surgery  ? 05/12/2021: Right retroareolar biopsy: Negative, 0/3 sentinel lymph nodes negative ?Right mastectomy: Benign: UDH ?Left mastectomy: Grade 2 IDC 1.9 cm margins negative, ER 95%, PR 95%, HER2 negative, Ki-67 1% ?  ? ? ?CHIEF COMPLIANT: Follow-up of breast cancer after recent surgery ? ?INTERVAL HISTORY: Malyiah Fellows is a  41 y.o. female is here because of recent diagnosis of left breast cancer.   She underwent left mastectomy on 05/12/2021 that revealed grade 2 invasive ductal carcinoma that was 1.9 cm and it was ER/PR positive HER2 negative with a Ki-67 1%. ? ? ?ALLERGIES:  is allergic to morphine. ? ?MEDICATIONS:  ?Current Outpatient Medications  ?Medication Sig Dispense Refill  ? ALPRAZolam (XANAX) 0.5 MG tablet TAKE 1 TABLET BY MOUTH TWICE DAILY AS NEEDED FOR ANXIETY 10 tablet 0  ? B Complex-C (SUPER B COMPLEX PO) Take by mouth daily.    ? diazepam (VALIUM) 2 MG tablet Take 1 tablet (2 mg total) by mouth every 12 (twelve) hours as needed for muscle spasms. 20 tablet 0  ? hydrOXYzine (ATARAX/VISTARIL) 25 MG tablet Take 25 mg by mouth at bedtime.    ? Multiple Vitamin (MULTIVITAMIN) tablet Take 1 tablet by mouth daily.    ? ondansetron (ZOFRAN) 4 MG tablet Take 1 tablet (4 mg total) by mouth every 8 (eight) hours as needed for nausea or vomiting. 20 tablet 0  ? oxyCODONE (ROXICODONE) 5 MG immediate release tablet Take 1 tablet (5 mg total) by mouth every 4 (four) hours as needed for severe pain. 30 tablet 0  ? tamoxifen (NOLVADEX) 10 MG tablet Take 1  tablet (10 mg total) by mouth daily. 30 tablet 3  ? valACYclovir (VALTREX) 1000 MG tablet     ? varenicline (CHANTIX) 1 MG tablet Take 1 mg by mouth 2 (two) times daily.    ? VITAMIN E PO Take by mouth daily.    ? ?No current facility-administered medications for this visit.  ? ? ?PHYSICAL  EXAMINATION: ?ECOG PERFORMANCE STATUS: 1 - Symptomatic but completely ambulatory ? ?Vitals:  ? 05/23/21 1357  ?BP: 114/72  ?Pulse: 74  ?Resp: 18  ?Temp: (!) 97.5 ?F (36.4 ?C)  ?SpO2: 96%  ? ?Filed Weights  ? 05/23/21 1357  ?Weight: 163 lb 4.8 oz (74.1 kg)  ? ?  ? ?LABORATORY DATA:  ?I have reviewed the data as listed ? ?  Latest Ref Rng & Units 03/09/2021  ? 12:54 PM 01/16/2018  ? 12:47 PM 06/03/2014  ?  3:15 PM  ?CMP  ?Glucose 70 - 99 mg/dL 97   103   82    ?BUN 6 - 20 mg/dL $Remove'8   9   16    'LLZDHQb$ ?Creatinine 0.44 - 1.00 mg/dL 0.89   0.77   0.82    ?Sodium 135 - 145 mmol/L 140   139   137    ?Potassium 3.5 - 5.1 mmol/L 3.7   3.3   4.3    ?Chloride 98 - 111 mmol/L 105   108   101    ?CO2 22 - 32 mmol/L $RemoveB'29   23   26    'PorriTiQ$ ?Calcium 8.9 - 10.3 mg/dL 9.1   8.8   9.3    ?Total Protein 6.5 - 8.1 g/dL 6.8    7.2    ?Total Bilirubin 0.3 - 1.2 mg/dL 0.4    0.5    ?Alkaline Phos 38 - 126 U/L 50    51    ?AST 15 - 41 U/L 19    18    ?ALT 0 - 44 U/L 27    20    ? ? ?Lab Results  ?Component Value Date  ? WBC 7.4 03/09/2021  ? HGB 13.1 03/09/2021  ? HCT 39.8 03/09/2021  ? MCV 92.8 03/09/2021  ? PLT 344 03/09/2021  ? NEUTROABS 4.9 03/09/2021  ? ? ?ASSESSMENT & PLAN:  ?Malignant neoplasm of upper-outer quadrant of left breast in female, estrogen receptor positive ([place]) ?05/12/2021: Right retroareolar biopsy: Negative, 0/3 sentinel lymph nodes negative ?Right mastectomy: Benign: UDH ?Left mastectomy: Grade 2 IDC 1.9 cm margins negative, ER 95%, PR 95%, HER2 negative, Ki-67 1% ? ?Pathology counseling: I discussed the final pathology report of the patient provided  a copy of this report. I discussed the margins as well as lymph node surgeries. We also discussed the final staging along with previously performed ER/PR and HER-2/neu testing. ? ?Treatment plan: ?1. Oncotype DX testing to determine if chemotherapy would be of any benefit followed by ?2. Adjuvant antiestrogen therapy ? ?No role of radiation since she had mastectomies and the tumor size was  only 1.9 cm. ? ?Return to clinic based upon Oncotype DX test result ? ? ? ?No orders of the defined types were placed in this encounter. ? ?The patient has a good understanding of the overall plan. she agrees with it. she will call with any problems that may develop before the next visit here. ?Total time spent: 30 mins including face to face time and time spent for planning, charting and co-ordination of care ? ? Harriette Ohara, MD ?05/23/21 ? ? ?  I Gardiner Coins am scribing for Dr. Lindi Adie ? ?I have reviewed the above documentation for accuracy and completeness, and I agree with the above. ?  ?

## 2021-05-23 ENCOUNTER — Inpatient Hospital Stay: Payer: BC Managed Care – PPO | Attending: Hematology and Oncology | Admitting: Hematology and Oncology

## 2021-05-23 ENCOUNTER — Other Ambulatory Visit: Payer: Self-pay

## 2021-05-23 DIAGNOSIS — C50412 Malignant neoplasm of upper-outer quadrant of left female breast: Secondary | ICD-10-CM

## 2021-05-23 DIAGNOSIS — Z170 Estrogen receptor positive status [ER+]: Secondary | ICD-10-CM

## 2021-05-23 NOTE — Assessment & Plan Note (Signed)
05/12/2021: Right retroareolar biopsy: Negative, 0/3 sentinel lymph nodes negative ?Right mastectomy: Benign: UDH ?Left mastectomy: Grade 2 IDC 1.9 cm margins negative, ER 95%, PR 95%, HER2 negative, Ki-67 1% ? ?Pathology counseling: I discussed the final pathology report of the patient provided  a copy of this report. I discussed the margins as well as lymph node surgeries. We also discussed the final staging along with previously performed ER/PR and HER-2/neu testing. ? ?Treatment plan: ?1. Oncotype DX testing to determine if chemotherapy would be of any benefit followed by ?2. Adjuvant radiation therapy followed by ?3. Adjuvant antiestrogen therapy ? ?Return to clinic based upon Oncotype DX test result ?

## 2021-05-24 ENCOUNTER — Ambulatory Visit (INDEPENDENT_AMBULATORY_CARE_PROVIDER_SITE_OTHER): Payer: BC Managed Care – PPO | Admitting: Plastic Surgery

## 2021-05-24 ENCOUNTER — Telehealth: Payer: Self-pay | Admitting: *Deleted

## 2021-05-24 ENCOUNTER — Encounter: Payer: Self-pay | Admitting: *Deleted

## 2021-05-24 ENCOUNTER — Encounter: Payer: Self-pay | Admitting: Plastic Surgery

## 2021-05-24 ENCOUNTER — Other Ambulatory Visit: Payer: Self-pay

## 2021-05-24 DIAGNOSIS — Z170 Estrogen receptor positive status [ER+]: Secondary | ICD-10-CM

## 2021-05-24 DIAGNOSIS — C50412 Malignant neoplasm of upper-outer quadrant of left female breast: Secondary | ICD-10-CM

## 2021-05-24 NOTE — Telephone Encounter (Signed)
Spoke with patient to inform her that she already had the oncotype done on her core bx back in February and it was 13/4% and so need for chemo.  Referral placed for SCP. ?

## 2021-05-24 NOTE — Progress Notes (Signed)
The patient is a 41 year old female here for follow-up on her bilateral mastectomies.  She has expanders in place.  They are softer today than they were last week.  Her drain output is improving but still needs to keep the drains.  The discoloration has a slight improvement today which is very encouraging.  I wanted to refill but I do not want any extra pressure on the skin so were going to wait to do the expander fill.  If her drain output decreases significantly over the next few days she can come in on Friday otherwise we will see her next week.  Continue with healthy eating. ?

## 2021-05-25 ENCOUNTER — Telehealth: Payer: Self-pay | Admitting: Adult Health

## 2021-05-25 ENCOUNTER — Encounter: Payer: Self-pay | Admitting: Plastic Surgery

## 2021-05-25 NOTE — Telephone Encounter (Signed)
.  Called patient to schedule appointment per 3/29 INBASKET, patient is aware of date and time.   ?

## 2021-05-27 ENCOUNTER — Ambulatory Visit: Payer: BC Managed Care – PPO | Admitting: Plastic Surgery

## 2021-05-27 ENCOUNTER — Ambulatory Visit (INDEPENDENT_AMBULATORY_CARE_PROVIDER_SITE_OTHER): Payer: Self-pay | Admitting: Surgical

## 2021-05-27 DIAGNOSIS — Z170 Estrogen receptor positive status [ER+]: Secondary | ICD-10-CM

## 2021-05-27 DIAGNOSIS — C50412 Malignant neoplasm of upper-outer quadrant of left female breast: Secondary | ICD-10-CM

## 2021-05-27 NOTE — Progress Notes (Signed)
41 year old female here for follow-up after bilateral mastectomies.  She has bilateral breast tissue expanders in place, she still has bilateral JP drains in place.  She reports output has been very minimal, she is not having any issues today.  She reports she has been doing Vaseline and gauze to bilateral mastectomy flap necrosis. ? ?Chaperone present on exam ?On exam right nipple necrosis is noted, there is no surrounding necrosis noted.  On exam of the left breast there is a area of necrosis that encompasses the left nipple areola and inferior mastectomy flap.  There is some new epithelialization at the edges.  I do not appreciate any surrounding erythema or cellulitic changes.  No subcutaneous fluid collections noted palpation.  Bilateral JP drains in place with scant amount of serous drainage in bulbs. ? ?Recommend continue with Vaseline to bilateral mastectomy flap necrosis ?Bilateral JP drains were removed.  Recommend Vaseline and gauze dressing changes to JP drain exit sites. ?Patient has a scheduled follow-up on Tuesday, a few days from now. ?

## 2021-05-30 NOTE — Progress Notes (Signed)
41 year old female here for follow-up on bilateral mastectomies.  She had bilateral breast tissue expanders placed after bilateral nipple sparing mastectomies on 05/12/2021. ? ?She currently has 455 breast expanders in place bilaterally, the left breast had 200 cc of injectable saline placed intraoperatively, the right had 250 cc of saline placed intraoperatively. ? ?She does have bilateral nipple areolar necrosis as well as some necrosis of the left inferior mastectomy flap.  She reports this is doing better.  She is not having any infectious symptoms.  ? ?Chaperone present on exam ?On exam right nipple necrosis is noted, improving.  No surrounding erythema.  Left breast there is necrosis on the inferior mastectomy flap as well as the nipple areola.  This is improving.  No surrounding erythema or cellulitic changes.  Small amount of fluid collection noted with palpation of the left lateral breast. ? ?We placed injectable saline in the Expander using a sterile technique: ?Right: 50 cc for a total of 300 / 455 cc ?Left: 50 cc for a total of 250 / 455 cc ? ?No fluid was able to be aspirated from either breast.  Care was taken not to puncture to the expander.  Recommend following up in 1 week.  Continue with Vaseline and gauze to bilateral mastectomy flap necrosis. ?

## 2021-05-31 ENCOUNTER — Ambulatory Visit (INDEPENDENT_AMBULATORY_CARE_PROVIDER_SITE_OTHER): Payer: BC Managed Care – PPO | Admitting: Surgical

## 2021-05-31 ENCOUNTER — Encounter: Payer: Self-pay | Admitting: Surgical

## 2021-05-31 DIAGNOSIS — C50412 Malignant neoplasm of upper-outer quadrant of left female breast: Secondary | ICD-10-CM

## 2021-05-31 DIAGNOSIS — Z170 Estrogen receptor positive status [ER+]: Secondary | ICD-10-CM

## 2021-06-02 ENCOUNTER — Other Ambulatory Visit: Payer: Self-pay | Admitting: Surgical

## 2021-06-02 ENCOUNTER — Telehealth: Payer: Self-pay | Admitting: *Deleted

## 2021-06-02 MED ORDER — TRAMADOL HCL 50 MG PO TABS: 50.0000 mg | ORAL_TABLET | Freq: Three times a day (TID) | ORAL | 0 refills | Status: DC | PRN

## 2021-06-02 MED ORDER — DIAZEPAM 2 MG PO TABS: 2.0000 mg | ORAL_TABLET | Freq: Two times a day (BID) | ORAL | 0 refills | Status: DC | PRN

## 2021-06-02 NOTE — Progress Notes (Signed)
Called patient to discuss some questions she had about medications.  She reports that at her appointment we discussed refill for Valium and some pain medications.  We will send prescription today. ? ?Patient reports she otherwise feels like things are going well with her healing progress.  She has continue with dressing changes.  She does feel as if the left side is a little bit more swollen.  She is not having any infectious symptoms.  She does report that she feels as if she is having some difficulty catching her breath, but she denies pain with deep breathing, chest pain, fatigue, swelling in her legs, rapid heart rate.  She reports otherwise she is feeling well.  She thinks it may be allergies related as she has not taken her hydroxyzine which she normally takes. ? ?She was advised on notifying our office of worsening symptoms.  Patient was understanding.  All of her questions were answered to her content. ?

## 2021-06-02 NOTE — Telephone Encounter (Signed)
Received call from patient stating she has reached out via mychart to Dr. Eusebio Friendly office and Dr. Ethlyn Gallery office regarding a prescription refill for Valium and oxycodone with no response.  She became emotional and states she feels like they do not understand what she is feeling and how it affects the way she thinks and look at her body.  She also feels like Dr. Ethlyn Gallery and Dr. Elenor Quinones' office do not communicate effectively with her and she feels overwhelmed at times. Informed her I would send a message to their office regarding the above.  Patient verbalized understanding.  ?

## 2021-06-07 ENCOUNTER — Ambulatory Visit (INDEPENDENT_AMBULATORY_CARE_PROVIDER_SITE_OTHER): Payer: BC Managed Care – PPO | Admitting: Surgical

## 2021-06-07 ENCOUNTER — Encounter: Payer: BC Managed Care – PPO | Admitting: Surgical

## 2021-06-07 DIAGNOSIS — C50412 Malignant neoplasm of upper-outer quadrant of left female breast: Secondary | ICD-10-CM

## 2021-06-07 DIAGNOSIS — Z170 Estrogen receptor positive status [ER+]: Secondary | ICD-10-CM

## 2021-06-07 NOTE — Progress Notes (Signed)
Patient is a 41 year old female here for follow-up after bilateral nipple sparing mastectomy and bilateral breast reconstruction and placement of tissue expanders.  Postoperatively she did have bilateral nipple and mastectomy flap necrosis. ? ?She feels as if the bilateral necrosis is improving.  She is doing dressing changes daily.  She is not having any infectious symptoms.  She does report tenderness in the right superior lateral breast.  ? ?She currently has 250/455 cc in the left breast tissue expander and 300/455 cc in the right breast tissue expander ? ?Chaperone present on exam ?On exam right nipple necrosis is noted, improving.  No surrounding erythema or cellulitic change.  No subcutaneous fluid collections noted.  ? ?Left breast mastectomy flap necrosis is improving, new epithelium noted at the edges.  Undeterminable depth at this time.  No erythema or cellulitic changes.  No foul odor is noted. ? ?Recommend continue with Vaseline and gauze dressing changes.  No signs of infection on exam.  Patient is scheduled to follow-up in 2 weeks for reevaluation. ? ?Pictures were obtained of the patient and placed in the chart with the patient's or guardian's permission. ? ?

## 2021-06-09 ENCOUNTER — Encounter: Payer: Self-pay | Admitting: Plastic Surgery

## 2021-06-10 ENCOUNTER — Encounter: Payer: Self-pay | Admitting: Physical Therapy

## 2021-06-10 ENCOUNTER — Encounter: Payer: Self-pay | Admitting: Plastic Surgery

## 2021-06-10 NOTE — Telephone Encounter (Signed)
Pt called in and stated that the 10:45 did not work and Catalina Antigua agreed to see the pt on Monday 06/10/2021 at 9:30 since the pt stated that she will having PT at 11:00.  Pt has been placed on the schedule for Monday 06/13/2021 at 9:30. ?

## 2021-06-12 NOTE — Therapy (Signed)
?OUTPATIENT PHYSICAL THERAPY BREAST CANCER POST OP FOLLOW UP ? ? ?Patient Name: Haley Evans ?MRN: [id number] ?DOB:09/27/1980, 40 y.o., female ?Today's Date: 06/13/2021 ? ? PT End of Session - 06/13/21 1304   ? ? Visit Number 2   ? Number of Visits 10   ? Date for PT Re-Evaluation 08/08/21   ? PT Start Time 1055   ? PT Stop Time 1200   ? PT Time Calculation (min) 65 min   ? Activity Tolerance Patient tolerated treatment well;Patient limited by pain   ? Behavior During Therapy WFL for tasks assessed/performed   ? ?  ?  ? ?  ? ? ?Past Medical History:  ?Diagnosis Date  ? Anxiety   ? Bipolar 2 disorder (HCC)   ? Cancer (HCC)   ? breast  ? Depression   ? ?Past Surgical History:  ?Procedure Laterality Date  ? ARM WOUND REPAIR / CLOSURE Right   ? fractured arm  ? CESAREAN SECTION    ? NIPPLE SPARING MASTECTOMY Right 05/12/2021  ? Procedure: RIGHT NIPPLE SPARING MASTECTOMY;  Surgeon: Toth, Paul III, MD;  Location: [facility];  Service: General;  Laterality: Right;  ? NIPPLE SPARING MASTECTOMY WITH SENTINEL LYMPH NODE BIOPSY Left 05/12/2021  ? Procedure: LEFT NIPPLE SPARING MASTECTOMY WITH LEFT SENTINEL LYMPH NODE BIOPSY;  Surgeon: Toth, Paul III, MD;  Location: [facility];  Service: General;  Laterality: Left;  ? PRE-PECTORAL BREAST RECON W/ PLACEMENT OF TISSUE EXPANDERS, DERMAL GRAFT Bilateral 05/12/2021  ? Procedure: IMMEDIATE BILATERAL BREAST RECONSTRUCTION WITH PLACEMENT OF TISSUE EXPANDERS AND FLEX HD;  Surgeon: Dillingham, Claire S, DO;  Location: [facility];  Service: Plastics;  Laterality: Bilateral;  ? ?Patient Active Problem List  ? Diagnosis Date Noted  ? Breast cancer (HCC) 05/12/2021  ? Genetic testing 03/18/2021  ? Family history of pancreatic cancer 03/10/2021  ? Malignant neoplasm of upper-outer quadrant of left breast in female, estrogen receptor positive (HCC) 03/07/2021  ? Anxiety 06/25/2011  ? ? ?PCP: Jones, Penny L, NP ? ?REFERRING PROVIDER: Toth, Paul III,  MD ? ?REFERRING DIAG: Left breast cancer ? ?THERAPY DIAG:  ?Malignant neoplasm of upper-outer quadrant of left breast in female, estrogen receptor positive (HCC) ? ?Abnormal posture ? ?Aftercare following surgery for neoplasm ? ?ONSET DATE: 05/12/2021 ? ?SUBJECTIVE:                                                                                                                                                                                          ? ?SUBJECTIVE STATEMENT: ?Patient reports she underwent a bilateral nipple sparing mastectomy and left sentinel node biopsy (3   negative nodes) on 05/12/2021 with expanders placed for reconstruction. Her oncotype score was 13 so no chemo is needed. She will likely proceed with anti-estrogen therapy. She reports multiple complications including left sided edema, poor healing at nipple sites, and left shoulder issues related to pain. She reports significant pain which she reports is not being well managed by medication. ? ?PERTINENT HISTORY:  ?Patient was diagnosed on 01/12/2021 with left grade II invasive ductal carcinoma breast cancer. She underwent a bilateral nipple sparing mastectomy and left sentinel node biopsy (3 negative nodes) on 05/12/2021 with expanders placed for reconstruction. It is ER/PR positive and HER2 negative with a Ki67 of 1%.  ? ?PATIENT GOALS:  Reassess how my recovery is going related to arm function, pain, and swelling. ? ?PAIN:  ?Are you having pain? Yes: NPRS scale: 8/10 ?Pain location: Left chest and scapula ?Pain description: Shooting pain, throbbing and tightness ?Aggravating factors: Swelling ?Relieving factors: Nothing ? ?PRECAUTIONS: Recent Surgery, left UE Lymphedema risk, Other: Precautions related to breast reconstruction ? ?ACTIVITY LEVEL / LEISURE: She is walking 30 minutes per day ? ? ?OBJECTIVE:  ? ?PATIENT SURVEYS:  ?QUICK DASH: ? Katina Dung - 06/13/21 0001   ? ? Open a tight or new jar Mild difficulty   ? Do heavy household chores (wash  walls, wash floors) Moderate difficulty   ? Carry a shopping bag or briefcase No difficulty   ? Wash your back Moderate difficulty   ? Use a knife to cut food No difficulty   ? Recreational activities in which you take some force or impact through your arm, shoulder, or hand (golf, hammering, tennis) Moderate difficulty   ? During the past week, to what extent has your arm, shoulder or hand problem interfered with your normal social activities with family, friends, neighbors, or groups? Quite a bit   ? During the past week, to what extent has your arm, shoulder or hand problem limited your work or other regular daily activities Modererately   ? Arm, shoulder, or hand pain. Moderate   ? Tingling (pins and needles) in your arm, shoulder, or hand Mild   ? Difficulty Sleeping Moderate difficulty   ? DASH Score 38.64 %   ? ?  ?  ? ?  ? ? ? ?OBSERVATIONS: ? Left breast with necrotic tissue present (see photo taken by plastic surgeon's office today). Inferior breast incisions still has steri-strips present. Right nipple appears very red and has some bleeding. Left breast appears very red with significant edema present. ? ?POSTURE:  ?Forward head and rounded shoulders posture ?  ?UPPER EXTREMITY AROM/PROM: ?  ?A/PROM Right ?03/09/2021 Left ?03/09/2021 Right ?06/13/2021 Left ?06/13/2021  ?Shoulder extension 36 33 50 36  ?Shoulder flexion 149 146 92 77  ?Shoulder abduction 161 159 113 71  ?Shoulder internal rotation 85 74 64 NT  ?Shoulder external rotation 89 88 82 NT  ?                        (Blank rows = not tested) ?  ?  ?  ?CERVICAL AROM: ?All within normal limits  ?  ?   ?LYMPHEDEMA ASSESSMENTS:  ?  ?LANDMARK RIGHT ?03/09/2021 LEFT ?03/09/2021 RIGHT ?06/13/2021 LEFT ?06/13/2021  ?10 cm proximal to olecranon process 28.4 27.9 28.3 28  ?Olecranon process 25 24.7 25.3 24.8  ?10 cm proximal to ulnar styloid process 21.3 20.5 20.8 20  ?Just proximal to ulnar styloid process 15.8 16 15.3 15.3  ?Across hand at  thumb web space 20 19.3  19.8 19.3  ?At base of 2nd digit 6.6 6.5 6.3 6.2  ?(Blank rows = not tested) ?  ? ? ?  ?Surgery type/Date: 05/12/2021 bil mastectomy, left sentinel node biopsy; bil expanders placed ?Number of lymph nodes removed: 3 ?Current/past treatment (chemo, radiation, hormone therapy): none ?Other symptoms:  ?Heaviness/tightness Yes ?Pain Yes ?Pitting edema No ?Infections No ?Decreased scar mobility Yes ?Stemmer sign No ? ? ?PATIENT EDUCATION:  ?Education details: HEP and lymphedema risk reduction ?Person educated: Patient ?Education method: Explanation, Demonstration, and Handouts ?Education comprehension: verbalized understanding and returned demonstration ? ? ?HOME EXERCISE PROGRAM: ? Reviewed previously given post op HEP. ? ? ?ASSESSMENT: ? ?CLINICAL IMPRESSION: ?Patient is having difficulty since having her bilateral mastectomy and reconstruction. The left breast is not healing well with necrotic tissue present and significant edema present with drainage. She has very limited shoulder ROM bilaterally (appears to mostly be limited by pain) and poor UE function. She will benefit from PT to regain ROM and function and address scar tissue, but both breasts need to continue to heal before initiating PT. We will plan to wait to hear from her surgeon prior to beginning PT. ? ?Pt will benefit from skilled therapeutic intervention to improve on the following deficits: Decreased knowledge of precautions, impaired UE functional use, pain, decreased ROM, postural dysfunction.  ? ?PT treatment/interventions: ADL/Self care home management, Therapeutic exercises, Therapeutic activity, Neuromuscular re-education, Balance training, Gait training, Patient/Family education, Joint mobilization, scar mobilization, and Manual therapy ? ? ? ? ?GOALS: ?Goals reviewed with patient? Yes ? ?LONG TERM GOALS:  (STG=LTG) ? ?GOALS Name Target Date Goal status  ?1 Pt will demonstrate she has regained full shoulder ROM and function post operatively  compared to baselines.  ?Baseline: 08/08/2021 IN PROGRESS  ?2 Patient will increase bilateral shoulder flexion to >/= 120 degrees for increased ease reaching overhead. 08/08/2021 INITIAL  ?3 Patient will i

## 2021-06-13 ENCOUNTER — Ambulatory Visit: Payer: BC Managed Care – PPO | Attending: General Surgery | Admitting: Physical Therapy

## 2021-06-13 ENCOUNTER — Encounter: Payer: Self-pay | Admitting: Physical Therapy

## 2021-06-13 ENCOUNTER — Ambulatory Visit (INDEPENDENT_AMBULATORY_CARE_PROVIDER_SITE_OTHER): Payer: BC Managed Care – PPO | Admitting: Surgical

## 2021-06-13 DIAGNOSIS — Z483 Aftercare following surgery for neoplasm: Secondary | ICD-10-CM | POA: Diagnosis not present

## 2021-06-13 DIAGNOSIS — R293 Abnormal posture: Secondary | ICD-10-CM | POA: Insufficient documentation

## 2021-06-13 DIAGNOSIS — Z170 Estrogen receptor positive status [ER+]: Secondary | ICD-10-CM

## 2021-06-13 DIAGNOSIS — C50412 Malignant neoplasm of upper-outer quadrant of left female breast: Secondary | ICD-10-CM | POA: Insufficient documentation

## 2021-06-13 NOTE — Patient Instructions (Addendum)
[facility] ? [address], [address] ? [place] [address] ? [phone] ? ?After Breast Cancer Class ?It is recommended you attend the ABC class to be educated on lymphedema risk reduction. This class is free of charge and lasts for 1 hour. It is a 1-time class. You will need to download the Webex app either on your phone or computer. We will send you a link the night before or the morning of the class. You should be able to click on that link to join the class. This is not a confidential class. You don't have to turn your camera on, but other participants may be able to see your email address. You are scheduled for May 1st at 11:00.  ? ?Scar massage ?You can begin gentle scar massage to you incision sites. Gently place one hand on the incision and move the skin (without sliding on the skin) in various directions. Do this for a few minutes and then you can gently massage either coconut oil or vitamin E cream into the scars. Hold off on scar massage until steri-strips are gone and incisions are fully healed. ? ?Compression garment ?You should continue wearing your compression bra until you feel like you no longer have swelling. ? ?Home exercise Program ?Continue doing the exercises you were given until you feel like you can do them without feeling any tightness at the end.  ? ?Walking Program ?Studies show that 30 minutes of walking per day (fast enough to elevate your heart rate) can significantly reduce the risk of a cancer recurrence. If you can't walk due to other medical reasons, we encourage you to find another activity you could do (like a stationary bike or water exercise). ? ?Posture ?After breast cancer surgery, people frequently sit with rounded shoulders posture because it puts their incisions on slack and feels better. If you sit like this and scar tissue forms in that position, you can become very tight and have pain sitting or standing with good posture. Try to be aware of  your posture and sit and stand up tall to heal properly. ? ?Follow up PT: ?It is recommended you return every 3 months for the first 3 years following surgery to be assessed on the SOZO machine for an L-Dex score. This helps prevent clinically significant lymphedema in 95% of patients. These follow up screens are 10 minute appointments that you are not billed for. You are scheduled for 4:00 on June 19th.  ? And AAROM exercises when cleared by MD ?

## 2021-06-13 NOTE — H&P (View-Only) (Signed)
Patient is a 41 year old female here for follow-up on her bilateral breast reconstruction.  Postoperatively she developed bilateral nipple and mastectomy flap necrosis.  She has noticed some increased drainage from the left breast.  She has continued with Vaseline and gauze to bilateral skin/nipple areolar necrosis.  The right breast is doing much better, it has nearly completely epithelialized. ? ?She is not having any infectious symptoms. ? ?Chaperone present on exam ?On exam right breast mastectomy flap is healing well, small wound noted of the nipple that is approximately 3 x 3 mm.  No surrounding erythema.  No subcutaneous fluid collection noted. ? ?On exam of the left breast there is fibrinous exudate sloughing from the left breast wound.  With pressure serous fluid is expressed from the left breast.  It is not cloudy.  There is no foul odor.  There is no cellulitic changes noted.  There is no tenderness to palpation.  There is a possibility for full-thickness loss along the lateral border where fluid is able to be expressed with pressure to the left lateral breast. ? ?We discussed that returning to the OR for excision and primary closure may be necessary due to the necrosis potentially being full-thickness.  Recommend continue with Vaseline and gauze to bilateral breast wounds.  Recommend following up next week for reevaluation.Pictures were obtained of the patient and placed in the chart with the patient's or guardian's permission. ?Discussed if she develops redness of the left breast or any infectious symptoms to call our office.   ? ?She currently has 250/455 in the left breast tissue expander and 300/455 cc in the right breast tissue expander.  She has not had any expander fills postoperatively. ?

## 2021-06-13 NOTE — Progress Notes (Signed)
Patient is a 41 year old female here for follow-up on her bilateral breast reconstruction.  Postoperatively she developed bilateral nipple and mastectomy flap necrosis.  She has noticed some increased drainage from the left breast.  She has continued with Vaseline and gauze to bilateral skin/nipple areolar necrosis.  The right breast is doing much better, it has nearly completely epithelialized. ? ?She is not having any infectious symptoms. ? ?Chaperone present on exam ?On exam right breast mastectomy flap is healing well, small wound noted of the nipple that is approximately 3 x 3 mm.  No surrounding erythema.  No subcutaneous fluid collection noted. ? ?On exam of the left breast there is fibrinous exudate sloughing from the left breast wound.  With pressure serous fluid is expressed from the left breast.  It is not cloudy.  There is no foul odor.  There is no cellulitic changes noted.  There is no tenderness to palpation.  There is a possibility for full-thickness loss along the lateral border where fluid is able to be expressed with pressure to the left lateral breast. ? ?We discussed that returning to the OR for excision and primary closure may be necessary due to the necrosis potentially being full-thickness.  Recommend continue with Vaseline and gauze to bilateral breast wounds.  Recommend following up next week for reevaluation.Pictures were obtained of the patient and placed in the chart with the patient's or guardian's permission. ?Discussed if she develops redness of the left breast or any infectious symptoms to call our office.   ? ?She currently has 250/455 in the left breast tissue expander and 300/455 cc in the right breast tissue expander.  She has not had any expander fills postoperatively. ?

## 2021-06-14 ENCOUNTER — Encounter: Payer: Self-pay | Admitting: Plastic Surgery

## 2021-06-14 ENCOUNTER — Encounter: Payer: Self-pay | Admitting: Physical Therapy

## 2021-06-14 ENCOUNTER — Telehealth: Payer: Self-pay | Admitting: Plastic Surgery

## 2021-06-14 ENCOUNTER — Encounter (HOSPITAL_COMMUNITY): Payer: Self-pay | Admitting: Plastic Surgery

## 2021-06-14 ENCOUNTER — Other Ambulatory Visit: Payer: Self-pay

## 2021-06-14 NOTE — Telephone Encounter (Signed)
LVM for patient advising that surgery has been changed to the [facility] for 4/19. Advised her that she should arrive to the hospital at 2:15 pm but to please call our office if she has questions.  ?

## 2021-06-14 NOTE — Progress Notes (Signed)
Haley Evans denies chest pain or  shortness of breath.  Patient denies having any s/s of Covid in her household.  Patient denies any known exposure to Covid.  ? ?[name]'s PCP is Eldridge Abrahams, NP with [facility]. ? ?I instructed Luana to shower with antibacteria soap.  DO not shave. No nail polish, artificial or acrylic nails. Wear clean clothes, brush your teeth. ?Glasses, contact lens,dentures or partials may not be worn in the OR. If you need to wear them, please bring a case for glasses, do not wear contacts or bring a case, the hospital does not have contact cases, dentures or partials will have to be removed , make sure they are clean, we will provide a denture cup to put them in. You will need some one to drive you home and a responsible person over the age of 75 to stay with you for the first 24 hours after surgery.  ?

## 2021-06-15 ENCOUNTER — Ambulatory Visit (HOSPITAL_COMMUNITY): Payer: BC Managed Care – PPO | Admitting: Anesthesiology

## 2021-06-15 ENCOUNTER — Encounter: Payer: Self-pay | Admitting: Plastic Surgery

## 2021-06-15 ENCOUNTER — Other Ambulatory Visit: Payer: Self-pay

## 2021-06-15 ENCOUNTER — Other Ambulatory Visit: Payer: Self-pay | Admitting: Physician Assistant

## 2021-06-15 ENCOUNTER — Encounter (HOSPITAL_COMMUNITY): Payer: Self-pay | Admitting: Plastic Surgery

## 2021-06-15 ENCOUNTER — Encounter (HOSPITAL_COMMUNITY)
Admission: RE | Disposition: A | Discharge status: Home / Self Care | Payer: Self-pay | Source: Home / Self Care | Attending: Plastic Surgery

## 2021-06-15 ENCOUNTER — Ambulatory Visit (HOSPITAL_COMMUNITY)
Admission: RE | Admit: 2021-06-15 | Discharge: 2021-06-15 | Disposition: A | Discharge status: Home / Self Care | Payer: BC Managed Care – PPO | Attending: Plastic Surgery | Admitting: Plastic Surgery

## 2021-06-15 DIAGNOSIS — Y838 Other surgical procedures as the cause of abnormal reaction of the patient, or of later complication, without mention of misadventure at the time of the procedure: Secondary | ICD-10-CM | POA: Insufficient documentation

## 2021-06-15 DIAGNOSIS — T8189XA Other complications of procedures, not elsewhere classified, initial encounter: Secondary | ICD-10-CM | POA: Insufficient documentation

## 2021-06-15 DIAGNOSIS — T8131XA Disruption of external operation (surgical) wound, not elsewhere classified, initial encounter: Secondary | ICD-10-CM | POA: Diagnosis not present

## 2021-06-15 DIAGNOSIS — Z87891 Personal history of nicotine dependence: Secondary | ICD-10-CM | POA: Diagnosis not present

## 2021-06-15 DIAGNOSIS — X58XXXA Exposure to other specified factors, initial encounter: Secondary | ICD-10-CM | POA: Diagnosis not present

## 2021-06-15 DIAGNOSIS — Z9013 Acquired absence of bilateral breasts and nipples: Secondary | ICD-10-CM | POA: Insufficient documentation

## 2021-06-15 DIAGNOSIS — N641 Fat necrosis of breast: Secondary | ICD-10-CM | POA: Insufficient documentation

## 2021-06-15 HISTORY — DX: Unspecified convulsions: R56.9

## 2021-06-15 HISTORY — PX: BREAST RECONSTRUCTION WITH PLACEMENT OF TISSUE EXPANDER AND FLEX HD (ACELLULAR HYDRATED DERMIS): SHX6295

## 2021-06-15 HISTORY — DX: Anemia, unspecified: D64.9

## 2021-06-15 HISTORY — DX: Family history of other specified conditions: Z84.89

## 2021-06-15 HISTORY — PX: DEBRIDEMENT AND CLOSURE WOUND: SHX5614

## 2021-06-15 LAB — CBC
HCT: 34.6 % — ABNORMAL LOW (ref 36.0–46.0)
Hemoglobin: 11 g/dL — ABNORMAL LOW (ref 12.0–15.0)
MCH: 29.8 pg (ref 26.0–34.0)
MCHC: 31.8 g/dL (ref 30.0–36.0)
MCV: 93.8 fL (ref 80.0–100.0)
Platelets: 432 10*3/uL — ABNORMAL HIGH (ref 150–400)
RBC: 3.69 MIL/uL — ABNORMAL LOW (ref 3.87–5.11)
RDW: 12 % (ref 11.5–15.5)
WBC: 9.5 10*3/uL (ref 4.0–10.5)
nRBC: 0 % (ref 0.0–0.2)

## 2021-06-15 LAB — POCT PREGNANCY, URINE: Preg Test, Ur: NEGATIVE

## 2021-06-15 SURGERY — DEBRIDEMENT, WOUND, WITH CLOSURE
Anesthesia: General | Site: Breast | Laterality: Left

## 2021-06-15 MED ORDER — FENTANYL CITRATE (PF) 250 MCG/5ML IJ SOLN
INTRAMUSCULAR | Status: AC
  Filled 2021-06-15: qty 5

## 2021-06-15 MED ORDER — LIDOCAINE 2% (20 MG/ML) 5 ML SYRINGE
INTRAMUSCULAR | Status: DC | PRN
  Administered 2021-06-15: 80 mg via INTRAVENOUS

## 2021-06-15 MED ORDER — ACETAMINOPHEN 325 MG PO TABS: 650.0000 mg | ORAL_TABLET | ORAL | Status: DC | PRN

## 2021-06-15 MED ORDER — FENTANYL CITRATE (PF) 100 MCG/2ML IJ SOLN: 25.0000 ug | INTRAMUSCULAR | Status: DC | PRN

## 2021-06-15 MED ORDER — OXYCODONE HCL 5 MG PO TABS
5.0000 mg | ORAL_TABLET | ORAL | Status: DC | PRN
  Administered 2021-06-15: 5 mg via ORAL

## 2021-06-15 MED ORDER — FENTANYL CITRATE (PF) 250 MCG/5ML IJ SOLN
INTRAMUSCULAR | Status: DC | PRN
  Administered 2021-06-15 (×3): 50 ug via INTRAVENOUS

## 2021-06-15 MED ORDER — ACETAMINOPHEN 500 MG PO TABS
1000.0000 mg | ORAL_TABLET | Freq: Once | ORAL | Status: AC
  Administered 2021-06-15: 1000 mg via ORAL
  Filled 2021-06-15: qty 2

## 2021-06-15 MED ORDER — SODIUM CHLORIDE 0.9% FLUSH: 3.0000 mL | INTRAVENOUS | Status: DC | PRN

## 2021-06-15 MED ORDER — OXYCODONE HCL 5 MG PO TABS: 5.0000 mg | ORAL_TABLET | Freq: Four times a day (QID) | ORAL | 0 refills | Status: DC | PRN

## 2021-06-15 MED ORDER — CHLORHEXIDINE GLUCONATE CLOTH 2 % EX PADS: 6.0000 | MEDICATED_PAD | Freq: Once | CUTANEOUS | Status: DC

## 2021-06-15 MED ORDER — CHLORHEXIDINE GLUCONATE 0.12 % MT SOLN
15.0000 mL | OROMUCOSAL | Status: AC
Start: 2021-06-15 — End: 2021-06-15
  Administered 2021-06-15: 15 mL via OROMUCOSAL
  Filled 2021-06-15: qty 15

## 2021-06-15 MED ORDER — PROPOFOL 10 MG/ML IV BOLUS
INTRAVENOUS | Status: DC | PRN
  Administered 2021-06-15: 160 mg via INTRAVENOUS

## 2021-06-15 MED ORDER — PROPOFOL 10 MG/ML IV BOLUS
INTRAVENOUS | Status: AC
  Filled 2021-06-15: qty 20

## 2021-06-15 MED ORDER — PHENYLEPHRINE 80 MCG/ML (10ML) SYRINGE FOR IV PUSH (FOR BLOOD PRESSURE SUPPORT)
PREFILLED_SYRINGE | INTRAVENOUS | Status: DC | PRN
  Administered 2021-06-15: 80 ug via INTRAVENOUS
  Administered 2021-06-15: 120 ug via INTRAVENOUS
  Administered 2021-06-15: 80 ug via INTRAVENOUS

## 2021-06-15 MED ORDER — FENTANYL CITRATE (PF) 100 MCG/2ML IJ SOLN
INTRAMUSCULAR | Status: AC
  Filled 2021-06-15: qty 2

## 2021-06-15 MED ORDER — MIDAZOLAM HCL 2 MG/2ML IJ SOLN
INTRAMUSCULAR | Status: DC | PRN
Start: 2021-06-15 — End: 2021-06-15
  Administered 2021-06-15: 2 mg via INTRAVENOUS

## 2021-06-15 MED ORDER — ACETAMINOPHEN 650 MG RE SUPP: 650.0000 mg | RECTAL | Status: DC | PRN

## 2021-06-15 MED ORDER — GLYCOPYRROLATE 0.2 MG/ML IJ SOLN
INTRAMUSCULAR | Status: DC | PRN
  Administered 2021-06-15: .1 mg via INTRAVENOUS

## 2021-06-15 MED ORDER — DEXAMETHASONE SODIUM PHOSPHATE 10 MG/ML IJ SOLN
INTRAMUSCULAR | Status: DC | PRN
  Administered 2021-06-15: 10 mg via INTRAVENOUS

## 2021-06-15 MED ORDER — OXYCODONE HCL 5 MG PO TABS
ORAL_TABLET | ORAL | Status: AC
  Filled 2021-06-15: qty 1

## 2021-06-15 MED ORDER — CEFAZOLIN SODIUM-DEXTROSE 2-4 GM/100ML-% IV SOLN
2.0000 g | INTRAVENOUS | Status: AC
  Administered 2021-06-15: 2 g via INTRAVENOUS
  Filled 2021-06-15: qty 100

## 2021-06-15 MED ORDER — SODIUM CHLORIDE 0.9% FLUSH: 3.0000 mL | Freq: Two times a day (BID) | INTRAVENOUS | Status: DC

## 2021-06-15 MED ORDER — PHENYLEPHRINE HCL-NACL 20-0.9 MG/250ML-% IV SOLN
INTRAVENOUS | Status: DC | PRN
  Administered 2021-06-15: 30 ug/min via INTRAVENOUS

## 2021-06-15 MED ORDER — SODIUM CHLORIDE 0.9 % IV SOLN: 250.0000 mL | INTRAVENOUS | Status: DC | PRN

## 2021-06-15 MED ORDER — FENTANYL CITRATE (PF) 100 MCG/2ML IJ SOLN
25.0000 ug | INTRAMUSCULAR | Status: DC | PRN
  Administered 2021-06-15: 50 ug via INTRAVENOUS

## 2021-06-15 MED ORDER — ONDANSETRON HCL 4 MG/2ML IJ SOLN
INTRAMUSCULAR | Status: DC | PRN
  Administered 2021-06-15: 4 mg via INTRAVENOUS

## 2021-06-15 MED ORDER — 0.9 % SODIUM CHLORIDE (POUR BTL) OPTIME
TOPICAL | Status: DC | PRN
  Administered 2021-06-15: 1000 mL

## 2021-06-15 MED ORDER — SCOPOLAMINE 1 MG/3DAYS TD PT72
1.0000 | MEDICATED_PATCH | Freq: Once | TRANSDERMAL | Status: DC
  Administered 2021-06-15: 1.5 mg via TRANSDERMAL
  Filled 2021-06-15: qty 1

## 2021-06-15 MED ORDER — MIDAZOLAM HCL 2 MG/2ML IJ SOLN
INTRAMUSCULAR | Status: AC
  Filled 2021-06-15: qty 2

## 2021-06-15 MED ORDER — ONDANSETRON HCL 4 MG/2ML IJ SOLN: 4.0000 mg | Freq: Once | INTRAMUSCULAR | Status: DC | PRN

## 2021-06-15 MED ORDER — EPHEDRINE SULFATE-NACL 50-0.9 MG/10ML-% IV SOSY
PREFILLED_SYRINGE | INTRAVENOUS | Status: DC | PRN
  Administered 2021-06-15: 10 mg via INTRAVENOUS
  Administered 2021-06-15 (×2): 5 mg via INTRAVENOUS

## 2021-06-15 MED ORDER — LACTATED RINGERS IV SOLN: INTRAVENOUS | Status: DC

## 2021-06-15 SURGICAL SUPPLY — 98 items
ADH SKN CLS APL DERMABOND .7 (GAUZE/BANDAGES/DRESSINGS) ×1
APL PRP STRL LF DISP 70% ISPRP (MISCELLANEOUS) ×1
BAG COUNTER SPONGE SURGICOUNT (BAG) ×3 IMPLANT
BAG DECANTER FOR FLEXI CONT (MISCELLANEOUS) ×3 IMPLANT
BAG SPNG CNTER NS LX DISP (BAG) ×1
BINDER BREAST 3XL (GAUZE/BANDAGES/DRESSINGS) IMPLANT
BINDER BREAST LRG (GAUZE/BANDAGES/DRESSINGS) IMPLANT
BINDER BREAST MEDIUM (GAUZE/BANDAGES/DRESSINGS) IMPLANT
BINDER BREAST XLRG (GAUZE/BANDAGES/DRESSINGS) ×1 IMPLANT
BINDER BREAST XXLRG (GAUZE/BANDAGES/DRESSINGS) IMPLANT
BIOPATCH RED 1 DISK 7.0 (GAUZE/BANDAGES/DRESSINGS) ×6 IMPLANT
BLADE CLIPPER SURG (BLADE) IMPLANT
BLADE HEX COATED 2.75 (ELECTRODE) ×3 IMPLANT
BLADE SURG 10 STRL SS (BLADE) IMPLANT
BLADE SURG 15 STRL LF DISP TIS (BLADE) ×2 IMPLANT
BLADE SURG 15 STRL SS (BLADE) ×2
BNDG GAUZE ELAST 4 BULKY (GAUZE/BANDAGES/DRESSINGS) IMPLANT
CANISTER SUCT 3000ML PPV (MISCELLANEOUS) ×3 IMPLANT
CHLORAPREP W/TINT 26 (MISCELLANEOUS) ×3 IMPLANT
COVER BACK TABLE 60X90IN (DRAPES) ×3 IMPLANT
COVER MAYO STAND STRL (DRAPES) ×3 IMPLANT
COVER SURGICAL LIGHT HANDLE (MISCELLANEOUS) ×3 IMPLANT
DECANTER SPIKE VIAL GLASS SM (MISCELLANEOUS) IMPLANT
DERMABOND ADVANCED (GAUZE/BANDAGES/DRESSINGS) ×1
DERMABOND ADVANCED .7 DNX12 (GAUZE/BANDAGES/DRESSINGS) ×2 IMPLANT
DRAIN CHANNEL 19F RND (DRAIN) ×3 IMPLANT
DRAPE CHEST BREAST 15X10 FENES (DRAPES) IMPLANT
DRAPE HALF SHEET 40X57 (DRAPES) ×3 IMPLANT
DRAPE INCISE IOBAN 66X45 STRL (DRAPES) IMPLANT
DRAPE LAPAROTOMY 100X72 PEDS (DRAPES) IMPLANT
DRAPE ORTHO SPLIT 77X108 STRL (DRAPES) ×4
DRAPE SURG 17X23 STRL (DRAPES) ×12 IMPLANT
DRAPE SURG ORHT 6 SPLT 77X108 (DRAPES) ×4 IMPLANT
DRAPE U-SHAPE 76X120 STRL (DRAPES) IMPLANT
DRAPE WARM FLUID 44X44 (DRAPES) ×3 IMPLANT
DRESSING MEPILEX FLEX 4X4 (GAUZE/BANDAGES/DRESSINGS) IMPLANT
DRSG ADAPTIC 3X8 NADH LF (GAUZE/BANDAGES/DRESSINGS) IMPLANT
DRSG CUTIMED SORBACT 7X9 (GAUZE/BANDAGES/DRESSINGS) ×1 IMPLANT
DRSG EMULSION OIL 3X3 NADH (GAUZE/BANDAGES/DRESSINGS) IMPLANT
DRSG HYDROCOLLOID 4X4 (GAUZE/BANDAGES/DRESSINGS) IMPLANT
DRSG MEPILEX FLEX 4X4 (GAUZE/BANDAGES/DRESSINGS) ×2
DRSG MEPILEX SACRM 8.7X9.8 (GAUZE/BANDAGES/DRESSINGS) ×1 IMPLANT
DRSG PAD ABDOMINAL 8X10 ST (GAUZE/BANDAGES/DRESSINGS) ×5 IMPLANT
DRSG TEGADERM 4X4.75 (GAUZE/BANDAGES/DRESSINGS) IMPLANT
DRSG TELFA 3X8 NADH (GAUZE/BANDAGES/DRESSINGS) IMPLANT
ELECT BLADE 4.0 EZ CLEAN MEGAD (MISCELLANEOUS)
ELECT REM PT RETURN 9FT ADLT (ELECTROSURGICAL) ×2
ELECTRODE BLDE 4.0 EZ CLN MEGD (MISCELLANEOUS) IMPLANT
ELECTRODE REM PT RTRN 9FT ADLT (ELECTROSURGICAL) ×2 IMPLANT
EVACUATOR SILICONE 100CC (DRAIN) ×3 IMPLANT
GAUZE SPONGE 2X2 8PLY STRL LF (GAUZE/BANDAGES/DRESSINGS) IMPLANT
GAUZE SPONGE 4X4 12PLY STRL (GAUZE/BANDAGES/DRESSINGS) ×3 IMPLANT
GAUZE XEROFORM 5X9 LF (GAUZE/BANDAGES/DRESSINGS) IMPLANT
GLOVE BIO SURGEON STRL SZ 6.5 (GLOVE) ×6 IMPLANT
GOWN STRL REUS W/ TWL LRG LVL3 (GOWN DISPOSABLE) ×4 IMPLANT
GOWN STRL REUS W/TWL LRG LVL3 (GOWN DISPOSABLE) ×4
GRAFT MYRIAD 3 LAYER 5X5 (Graft) ×1 IMPLANT
IRRIGATION SURGIPHOR STRL (IV SOLUTION) ×1 IMPLANT
KIT BASIN (CUSTOM PROCEDURE TRAY) ×3 IMPLANT
KIT BASIN OR (CUSTOM PROCEDURE TRAY) ×3 IMPLANT
KIT TURNOVER KIT B (KITS) ×3 IMPLANT
NDL 18GX1X1/2 (RX/OR ONLY) (NEEDLE) IMPLANT
NDL HYPO 25X1 1.5 SAFETY (NEEDLE) ×2 IMPLANT
NEEDLE 18GX1X1/2 (RX/OR ONLY) (NEEDLE) ×2 IMPLANT
NEEDLE HYPO 25X1 1.5 SAFETY (NEEDLE) ×2 IMPLANT
NS IRRIG 1000ML POUR BTL (IV SOLUTION) ×6 IMPLANT
PACK GENERAL/GYN (CUSTOM PROCEDURE TRAY) ×3 IMPLANT
PAD ARMBOARD 7.5X6 YLW CONV (MISCELLANEOUS) ×3 IMPLANT
PAD DRESSING TELFA 3X8 NADH (GAUZE/BANDAGES/DRESSINGS) IMPLANT
PENCIL SMOKE EVACUATOR (MISCELLANEOUS) ×3 IMPLANT
PIN SAFETY STERILE (MISCELLANEOUS) ×3 IMPLANT
SET ASEPTIC TRANSFER (MISCELLANEOUS) IMPLANT
SHEET MEDIUM DRAPE 40X70 STRL (DRAPES) IMPLANT
SPONGE GAUZE 2X2 STER 10/PKG (GAUZE/BANDAGES/DRESSINGS) ×1
SPONGE T-LAP 18X18 ~~LOC~~+RFID (SPONGE) ×3 IMPLANT
STAPLER VISISTAT 35W (STAPLE) IMPLANT
SUT MNCRL AB 3-0 PS2 18 (SUTURE) IMPLANT
SUT MNCRL AB 4-0 PS2 18 (SUTURE) ×6 IMPLANT
SUT MON AB 3-0 SH 27 (SUTURE) ×4
SUT MON AB 3-0 SH27 (SUTURE) ×4 IMPLANT
SUT MON AB 5-0 PS2 18 (SUTURE) ×6 IMPLANT
SUT PDS AB 2-0 CT1 27 (SUTURE) ×12 IMPLANT
SUT PDS AB 3-0 SH 27 (SUTURE) ×1 IMPLANT
SUT SILK 3 0 PS 1 (SUTURE) IMPLANT
SUT SILK 4 0 PS 2 (SUTURE) ×3 IMPLANT
SUT VIC AB 3-0 SH 18 (SUTURE) IMPLANT
SUT VIC AB 5-0 PS2 18 (SUTURE) ×2 IMPLANT
SWAB COLLECTION DEVICE MRSA (MISCELLANEOUS) IMPLANT
SWAB CULTURE ESWAB REG 1ML (MISCELLANEOUS) IMPLANT
SYR 50ML LL SCALE MARK (SYRINGE) ×1 IMPLANT
SYR BULB IRRIG 60ML STRL (SYRINGE) IMPLANT
SYR CONTROL 10ML LL (SYRINGE) ×3 IMPLANT
TOWEL GREEN STERILE (TOWEL DISPOSABLE) ×3 IMPLANT
TOWEL GREEN STERILE FF (TOWEL DISPOSABLE) ×6 IMPLANT
TRAY FOLEY MTR SLVR 14FR STAT (SET/KITS/TRAYS/PACK) IMPLANT
TUBE CONNECTING 20X1/4 (TUBING) ×3 IMPLANT
UNDERPAD 30X36 HEAVY ABSORB (UNDERPADS AND DIAPERS) ×3 IMPLANT
YANKAUER SUCT BULB TIP NO VENT (SUCTIONS) ×3 IMPLANT

## 2021-06-15 NOTE — Op Note (Signed)
DATE OF OPERATION: 06/15/2021 ? ?LOCATION: [facility] outpatient ? ?PREOPERATIVE DIAGNOSIS: left breast wound ? ?POSTOPERATIVE DIAGNOSIS: Same ? ?PROCEDURE:  ?1. Excision of left breast wound, skin, soft tissue 2.5 x 5 cm and muscle 1 cm ?2. Placement of Myriad 5 x 5 cm sheet ?3. Repair of Acellular Dermal Matrix 2 cm ? ?SURGEON: Journiee Feldkamp Sanger Shriya Aker, DO ? ?ASSISTANT: Roetta Sessions, MD ? ?EBL: 1 cc ? ?CONDITION: Stable ? ?COMPLICATIONS: None ? ?INDICATION: The patient, Haley Evans, is a 41 y.o. female born on 1980/05/10, is here for treatment of a wound of the left breast.  ? ?PROCEDURE DETAILS:  ?The patient was seen prior to surgery and marked.  The IV antibiotics were given. The patient was taken to the operating room and given a general anesthetic. A standard time out was performed and all information was confirmed by those in the room. SCDs were placed.   The left breast was prepped and draped.  The pocket was irrigated with saline and betadine. There was no purulence.  The expander was evacuated of 150 cc of saline.  The wound was excised with the tissue scissors for the 2.5 x 5 cm are of soft tissue and skin.  The 1 cm of muscle was excised with the #10 blade.The The ADM was repaired using 3-0 PDS as it became loss to the pectoralis muscle.  This was 2 cm in length.   The Myriad was then placed on the wound and the 5-0 Vicryl used to secure it.   The sorbact was placed and secured with the 5-0 Vicryl. KY gel and gauze was applied. The patient was allowed to wake up and taken to recovery room in stable condition at the end of the case. The family was notified at the end of the case.  ? ?The advanced practice practitioner (APP) assisted throughout the case.  The APP was essential in retraction and counter traction when needed to make the case progress smoothly.  This retraction and assistance made it possible to see the tissue plans for the procedure.  The assistance was needed for blood  control, tissue re-approximation and assisted with closure of the incision site. ? ?

## 2021-06-15 NOTE — Progress Notes (Signed)
Postop pain medication 

## 2021-06-15 NOTE — Interval H&P Note (Signed)
History and Physical Interval Note: ? ?06/15/2021 ?2:15 PM ? ?Haley Evans  has presented today for surgery, with the diagnosis of Breast Wound.  The various methods of treatment have been discussed with the patient and family. After consideration of risks, benefits and other options for treatment, the patient has consented to  Procedure(s) with comments: ?DEBRIDEMENT AND CLOSURE LEFT BREAST WOUND (Left) - 1 hour ?REPLACEMENT OF TISSUE EXPANDER AND FLEX HD (ACELLULAR HYDRATED DERMIS) (Left) ?APPLICATION OF MYRIAD (Left) as a surgical intervention.  The patient's history has been reviewed, patient examined, no change in status, stable for surgery.  I have reviewed the patient's chart and labs.  Questions were answered to the patient's satisfaction.   ? ? ?Haley Evans ? ? ?

## 2021-06-15 NOTE — H&P (Signed)
Haley Evans is an 41 y.o. female.   ?Chief Complaint: left breast wound ?HPI: The patient is a 41 yrs old female here for treatment of her left breast wound.  She underwent a mastectomy with reconstruction using an expander.  She had some skin loss and development of a wound.  This may be from the surgery compounded from her tobacco history.  The are does not appear infected. ? ?Past Medical History:  ?Diagnosis Date  ? Anemia   ? Anxiety   ? Bipolar 2 disorder ([place])   ? Cancer [facility])   ? breast  ? Depression   ? Family history of adverse reaction to anesthesia   ? aunt woke up during hip replacement  ? Seizure ([place])   ? vs syncopy, age 39 and 12/2017, Seen by neurologist, a definitive diagnosis was not made.  ? ? ?Past Surgical History:  ?Procedure Laterality Date  ? ARM WOUND REPAIR / CLOSURE Right   ? fractured arm  ? CESAREAN SECTION    ? NIPPLE SPARING MASTECTOMY Right 05/12/2021  ? Procedure: RIGHT NIPPLE SPARING MASTECTOMY;  Surgeon: Haley Kussmaul, MD;  Location: [place];  Service: General;  Laterality: Right;  ? NIPPLE SPARING MASTECTOMY WITH SENTINEL LYMPH NODE BIOPSY Left 05/12/2021  ? Procedure: LEFT NIPPLE SPARING MASTECTOMY WITH LEFT SENTINEL LYMPH NODE BIOPSY;  Surgeon: Haley Kussmaul, MD;  Location: [place];  Service: General;  Laterality: Left;  ? PRE-PECTORAL BREAST RECON W/ PLACEMENT OF TISSUE EXPANDERS, DERMAL GRAFT Bilateral 05/12/2021  ? Procedure: IMMEDIATE BILATERAL BREAST RECONSTRUCTION WITH PLACEMENT OF TISSUE EXPANDERS AND FLEX HD;  Surgeon: Haley Going, DO;  Location: [place];  Service: Plastics;  Laterality: Bilateral;  ? ? ?Family History  ?Problem Relation Age of Onset  ? Bladder Cancer Maternal Grandmother 66  ? Pancreatic cancer Maternal Grandfather 41  ? ?Social History:  reports that she quit smoking about 2 months ago. Her smoking use included cigarettes. She has never used smokeless tobacco. She reports current  alcohol use of about 5.0 standard drinks per week. She reports that she does not use drugs. ? ?Allergies:  ?Allergies  ?Allergen Reactions  ? Morphine Itching  ? ? ?Medications Prior to Admission  ?Medication Sig Dispense Refill  ? acetaminophen (TYLENOL) 500 MG tablet Take 1,000 mg by mouth every 6 (six) hours as needed for moderate pain.    ? ALPRAZolam (XANAX) 0.5 MG tablet TAKE 1 TABLET BY MOUTH TWICE DAILY AS NEEDED FOR ANXIETY 10 tablet 0  ? aspirin EC 81 MG tablet Take 81 mg by mouth daily. Swallow whole.    ? B Complex-C (SUPER B COMPLEX PO) Take 1 tablet by mouth daily.    ? Bioflavonoid Products (SUPER C-500 COMPLEX) TBCR Take 1 tablet by mouth daily.    ? hydrOXYzine (ATARAX) 50 MG tablet Take 50 mg by mouth at bedtime.    ? Multiple Vitamin (MULTIVITAMIN WITH MINERALS) TABS tablet Take 1 tablet by mouth daily.    ? TURMERIC PO Take 1 capsule by mouth daily.    ? varenicline (CHANTIX) 1 MG tablet Take 1 mg by mouth daily.    ? VITAMIN E PO Take 1 capsule by mouth daily.    ? diazepam (VALIUM) 2 MG tablet Take 1 tablet (2 mg total) by mouth every 12 (twelve) hours as needed for muscle spasms. (Patient not taking: Reported on 06/14/2021) 20 tablet 0  ? methocarbamol (ROBAXIN) 500 MG tablet Take 1,000 mg by  mouth 2 (two) times daily.    ? ondansetron (ZOFRAN) 4 MG tablet Take 1 tablet (4 mg total) by mouth every 8 (eight) hours as needed for nausea or vomiting. (Patient not taking: Reported on 06/14/2021) 20 tablet 0  ? oxyCODONE (ROXICODONE) 5 MG immediate release tablet Take 1 tablet (5 mg total) by mouth every 4 (four) hours as needed for severe pain. (Patient not taking: Reported on 06/14/2021) 30 tablet 0  ? tamoxifen (NOLVADEX) 10 MG tablet Take 1 tablet (10 mg total) by mouth daily. (Patient not taking: Reported on 06/14/2021) 30 tablet 3  ? traMADol (ULTRAM) 50 MG tablet Take 1 tablet (50 mg total) by mouth every 8 (eight) hours as needed for up to 10 doses. (Patient not taking: Reported on 06/14/2021) 10  tablet 0  ? ? ?Results for orders placed or performed during the hospital encounter of 06/15/21 (from the past 48 hour(s))  ?CBC     Status: Abnormal  ? Collection Time: 06/15/21  1:34 PM  ?Result Value Ref Range  ? WBC 9.5 4.0 - 10.5 K/uL  ? RBC 3.69 (L) 3.87 - 5.11 MIL/uL  ? Hemoglobin 11.0 (L) 12.0 - 15.0 g/dL  ? HCT 34.6 (L) 36.0 - 46.0 %  ? MCV 93.8 80.0 - 100.0 fL  ? MCH 29.8 26.0 - 34.0 pg  ? MCHC 31.8 30.0 - 36.0 g/dL  ? RDW 12.0 11.5 - 15.5 %  ? Platelets 432 (H) 150 - 400 K/uL  ? nRBC 0.0 0.0 - 0.2 %  ?  Comment: Performed at [facility], [place] [address] [place], [place] [address]  ?Pregnancy, urine POC     Status: None  ? Collection Time: 06/15/21  1:40 PM  ?Result Value Ref Range  ? Preg Test, Ur NEGATIVE NEGATIVE  ?  Comment:        ?THE SENSITIVITY OF THIS ?METHODOLOGY IS >24 mIU/mL ?  ? ?No results found. ? ?Review of Systems  ?Constitutional: Negative.   ?HENT: Negative.    ?Eyes: Negative.   ?Respiratory: Negative.    ?Cardiovascular: Negative.   ?Gastrointestinal: Negative.   ?Genitourinary: Negative.   ?Skin:  Positive for color change and wound.  ?Hematological: Negative.   ?Psychiatric/Behavioral: Negative.    ? ?Blood pressure 122/81, pulse 99, temperature 98.1 ?F (36.7 ?C), temperature source Oral, resp. rate 18, height $RemoveBe'5\' 9"'dNbNAGlBD$  (1.753 m), weight 73.5 kg, last menstrual period 06/15/2021, SpO2 99 %. ?Physical Exam ?Vitals reviewed.  ?Constitutional:   ?   Appearance: Normal appearance.  ?HENT:  ?   Head: Normocephalic and atraumatic.  ?Cardiovascular:  ?   Rate and Rhythm: Normal rate.  ?   Pulses: Normal pulses.  ?Pulmonary:  ?   Effort: Pulmonary effort is normal.  ?Skin: ?   General: Skin is warm.  ?   Capillary Refill: Capillary refill takes less than 2 seconds.  ?   Findings: Erythema present.  ?Neurological:  ?   Mental Status: She is alert and oriented to person, place, and time.  ?Psychiatric:     ?   Mood and Affect: Mood normal.     ?   Behavior: Behavior normal.     ?   Thought  Content: Thought content normal.  ?  ? ?Assessment/Plan ?Left breast wound ?Plan for debridement with possible myriad placement to left breast.  Risks and complication were discussed as well as possibilities.  There is a possibility that the expander will need to be removed. ? ?Loel Lofty  Ayelen Sciortino, DO ?06/15/2021, 2:15 PM ? ? ? ?

## 2021-06-15 NOTE — Discharge Instructions (Addendum)
Wound Care  ? ?Guide to Wound Care  ?Proper wound care may reduce the risk of infection, improve healing rates, and limit scarring.  This is a general guide to help care for and manage wounds treated with product wound matrix.  ? ?Dressing Changes ?The frequency of dressing changes can vary based on which product was applied, the size of the wound, or the amount of wound drainage. Dressing inspections are recommended, at least weekly.   ?If you have a Wound VAC it will be changed in one week after the first time it is applied.  Then it will be changed once or twice a week.   ?If you don't have a Wound VAC, then place KY gel on the wound daily and cover with gauze. ? ?Dressing Types ?Primary Dressing:  Non-adherent dressing goes directly over wounds being treated with the powder or sheet.  ?Secondary Dressing:  Secures the primary dressing in place and provides extra protection, compression, and absorption. ? ?1. Wash Hands - To help decrease the risk of infection, caregivers should wash their hands for a minimum of 20 seconds and may use medical gloves.  ? ?2. Remove the Dressings - Avoid removing product from the wound by carefully removing the applicable dressing(s) at the time points recommended above, or as recommended by the treating physician.  ?Expected Color and Odor:  It is entirely normal for the wound to have an unpleasant odor and to form a caramel-colored gel as the product absorbs into the wound. It is important to leave this gel on the wound site. ?Leave the green gauze on. ? ?3. Clean the Wound - Use clean water or saline to gently rinse around the wound surface and remove any excess discharge that may be present on the wound. Do not wipe off any of the caramel-colored gel on the wound.  ? ?What to look out for: ? Large or increased amount of drainage  ? Surrounding skin has worsening redness or hot to touch  ? Increased pain in or around the wound  ? Flu-like symptoms, fatigue, decreased appetite,  fever  ? Hard, crusty wound surface with black or brown coloring ? ?4. Apply New Dressings - Dressings should cover the entire wound and be suitable for maintaining a moist wound environment.  The non-adherent mesh dressing should be left in place.  New dressing should consist of KY Jelly to keep the wound moist and soft gauze secured with a wrap or tape.  ? ?Maintain a Hydrated Wound Area ?It is important to keep the wound area moist throughout the healing process. If the wound appears to be dry during dressing changes, select a dressing that will hydrate the wound and maintain that ideal moist environment. If you are unsure what to do, ask the treating physician. ? ?Remodeling Process ?Every patient heals differently, and no two cases are the same. The size and location of the wound, product type and layering configurations, and general patient health all contribute to how quickly a wound will heal.  While many factors can influence the rate at which the product absorbs, the following can be used as a general guide.  ? ?THINGS TO DO: ?Refrain from smoking ?High protein diet with plenty of vegetables and some fruit  ?Limit simple processed carbohydrates and sugar ?Protect the wound from trauma ?Protect the dressing ? ?powder       Sheet            Sorbact dressing ? ?

## 2021-06-15 NOTE — Transfer of Care (Signed)
Immediate Anesthesia Transfer of Care Note ? ?Patient: Haley Evans ? ?Procedure(s) Performed: DEBRIDEMENT AND CLOSURE LEFT BREAST WOUND (Left: Breast) ?REPLACEMENT OF TISSUE EXPANDER AND FLEX HD (ACELLULAR HYDRATED DERMIS) (Left: Breast) ?APPLICATION OF MYRIAD (Left: Breast) ? ?Patient Location: PACU ? ?Anesthesia Type:General ? ?Level of Consciousness: awake ? ?Airway & Oxygen Therapy: Patient Spontanous Breathing ? ?Post-op Assessment: Report given to RN and Post -op Vital signs reviewed and stable ? ?Post vital signs: Reviewed and stable ? ?Last Vitals:  ?Vitals Value Taken Time  ?BP    ?Temp    ?Pulse    ?Resp    ?SpO2    ? ? ?Last Pain:  ?Vitals:  ? 06/15/21 1352  ?TempSrc: Oral  ?PainSc:   ?   ? ?Patients Stated Pain Goal: 0 (06/15/21 1321) ? ?Complications: No notable events documented. ?

## 2021-06-15 NOTE — Anesthesia Preprocedure Evaluation (Addendum)
Anesthesia Evaluation  ?Patient identified by MRN, date of birth, ID band ?Patient awake ? ? ? ?Reviewed: ?Allergy & Precautions, NPO status , Patient's Chart, lab work & pertinent test results ? ?History of Anesthesia Complications ?Negative for: history of anesthetic complications ? ?Airway ?Mallampati: II ? ?TM Distance: >3 FB ?Neck ROM: Full ? ? ? Dental ? ?(+) Teeth Intact, Dental Advisory Given ?  ?Pulmonary ?former smoker,  ?  ?Pulmonary exam normal ?breath sounds clear to auscultation ? ? ? ? ? ? Cardiovascular ?Exercise Tolerance: Good ?negative cardio ROS ?Normal cardiovascular exam ?Rhythm:Regular Rate:Normal ? ? ?  ?Neuro/Psych ?Seizures -,  PSYCHIATRIC DISORDERS Anxiety Depression Bipolar Disorder   ? GI/Hepatic ?negative GI ROS, Neg liver ROS,   ?Endo/Other  ?negative endocrine ROS ? Renal/GU ?negative Renal ROS  ? ?  ?Musculoskeletal ?negative musculoskeletal ROS ?(+)  ? Abdominal ?  ?Peds ? Hematology ?negative hematology ROS ?(+)   ?Anesthesia Other Findings ?Day of surgery medications reviewed with the patient. ? ?Breast Wound ? Reproductive/Obstetrics ? ?  ? ? ? ? ? ? ? ? ? ? ? ? ? ?  ?  ? ? ? ? ? ? ? ?Anesthesia Physical ?Anesthesia Plan ? ?ASA: 2 ? ?Anesthesia Plan: General  ? ?Post-op Pain Management: Tylenol PO (pre-op)* and Toradol IV (intra-op)*  ? ?Induction: Intravenous ? ?PONV Risk Score and Plan: 3 and Midazolam, Dexamethasone, Ondansetron and Scopolamine patch - Pre-op ? ?Airway Management Planned: LMA ? ?Additional Equipment:  ? ?Intra-op Plan:  ? ?Post-operative Plan: Extubation in OR ? ?Informed Consent: I have reviewed the patients History and Physical, chart, labs and discussed the procedure including the risks, benefits and alternatives for the proposed anesthesia with the patient or authorized representative who has indicated his/her understanding and acceptance.  ? ? ? ?Dental advisory given ? ?Plan Discussed with: CRNA ? ?Anesthesia Plan  Comments:   ? ? ? ? ? ?Anesthesia Quick Evaluation ? ?

## 2021-06-15 NOTE — Anesthesia Procedure Notes (Signed)
Procedure Name: LMA Insertion ?Date/Time: 06/15/2021 2:27 PM ?Performed by: Vonna Drafts, CRNA ?Pre-anesthesia Checklist: Patient identified, Emergency Drugs available, Suction available, Patient being monitored and Timeout performed ?Patient Re-evaluated:Patient Re-evaluated prior to induction ?Oxygen Delivery Method: Circle system utilized ?Preoxygenation: Pre-oxygenation with 100% oxygen ?Induction Type: IV induction ?Ventilation: Mask ventilation without difficulty ?LMA: LMA inserted ?LMA Size: 4.0 ?Number of attempts: 1 ?Tube secured with: Tape ?Dental Injury: Teeth and Oropharynx as per pre-operative assessment  ? ? ? ? ?

## 2021-06-15 NOTE — Anesthesia Postprocedure Evaluation (Signed)
Anesthesia Post Note ? ?Patient: Haley Evans ? ?Procedure(s) Performed: DEBRIDEMENT AND CLOSURE LEFT BREAST WOUND (Left: Breast) ?REPLACEMENT OF TISSUE EXPANDER AND FLEX HD (ACELLULAR HYDRATED DERMIS) (Left: Breast) ?APPLICATION OF MYRIAD (Left: Breast) ? ?  ? ?Patient location during evaluation: PACU ?Anesthesia Type: General ?Level of consciousness: awake and alert ?Pain management: pain level controlled ?Vital Signs Assessment: post-procedure vital signs reviewed and stable ?Respiratory status: spontaneous breathing, nonlabored ventilation, respiratory function stable and patient connected to nasal cannula oxygen ?Cardiovascular status: blood pressure returned to baseline and stable ?Postop Assessment: no apparent nausea or vomiting ?Anesthetic complications: no ? ? ?No notable events documented. ? ?Last Vitals:  ?Vitals:  ? 06/15/21 1553 06/15/21 1600  ?BP:  116/75  ?Pulse: 89 89  ?Resp: 11 (!) 25  ?Temp:  36.7 ?C  ?SpO2: 98% 95%  ?  ?Last Pain:  ?Vitals:  ? 06/15/21 1600  ?TempSrc:   ?PainSc: 4   ? ? ?  ?  ?  ?  ?  ?  ? ?[place] Lighter ? ? ? ? ?

## 2021-06-16 ENCOUNTER — Encounter: Payer: Self-pay | Admitting: Plastic Surgery

## 2021-06-16 ENCOUNTER — Encounter (HOSPITAL_COMMUNITY): Payer: Self-pay | Admitting: Plastic Surgery

## 2021-06-16 ENCOUNTER — Telehealth: Payer: Self-pay

## 2021-06-16 NOTE — Telephone Encounter (Signed)
Called pt, na, left vm advising that oxycodone was sent in yesterday to [facility] on [address] in [place]. She just has to go by and pick it up. Left cb number if she had additional questions.  ?

## 2021-06-16 NOTE — Telephone Encounter (Signed)
Patient called yesterday to say she just left the hospital and at discharge they said she had pain medication to take after this surgery.  Patient said she hasn't had anything to take since her surgery on 3/16.  Patient said she has a little of the tramadol left, but it doesn't really do anything, so she stopped taking it.  Please call. ?

## 2021-06-18 NOTE — Telephone Encounter (Signed)
I called the patient today to check on her from surgery this past week.  We discussed the dressing change.  KY twice a day sounds good.  She is doing vitamins.  Sounds like she is doing well.  Pain in the left breast is improved.  Plan for her to come in next week.  She knows to call with any questions or concerns. ?

## 2021-06-20 ENCOUNTER — Encounter: Payer: Self-pay | Admitting: Plastic Surgery

## 2021-06-22 ENCOUNTER — Encounter: Payer: Self-pay | Admitting: Plastic Surgery

## 2021-06-22 ENCOUNTER — Encounter (HOSPITAL_COMMUNITY): Payer: Self-pay | Admitting: Plastic Surgery

## 2021-06-24 ENCOUNTER — Ambulatory Visit (INDEPENDENT_AMBULATORY_CARE_PROVIDER_SITE_OTHER): Payer: BC Managed Care – PPO | Admitting: Surgical

## 2021-06-24 ENCOUNTER — Encounter: Payer: BC Managed Care – PPO | Admitting: Surgical

## 2021-06-24 DIAGNOSIS — C50412 Malignant neoplasm of upper-outer quadrant of left female breast: Secondary | ICD-10-CM

## 2021-06-24 DIAGNOSIS — Z170 Estrogen receptor positive status [ER+]: Secondary | ICD-10-CM

## 2021-06-24 NOTE — Progress Notes (Signed)
Patient is a 41 year old female for follow-up after debridement of her left breast wound with Dr. Marla Roe on 06/15/2021.  She had wound matrix placed followed by Sorbact and K-Y jelly and gauze.  She reports she is doing much better after surgery, pain is significantly improved.  She is not having any infectious symptoms.  She has been doing dressing changes at home which have been fine. ? ?She would like to return to work on Monday, 06/27/2021 full-time, she reports she feels comfortable doing this and giving this a try.  She reports she may need to work half days depending on how she feels returning. ? ?Chaperone present on exam ?On exam right breast incision is intact, healing well, right NAC is healing well, no necrosis is noted.  Centrally she does have some exposed granulation tissue but new epithelialization noted throughout. ? ?On exam of the left breast, Sorbact is in place, no surrounding erythema or cellulitic changes.  No foul odor is noted.  No drainage is noted with palpation.  No subcutaneous fluid collections noted.  No skin necrosis noted. ? ?Recommend continue with K-Y jelly dressing changes to the left breast wound, follow-up in 1 week. ?Recommend continue with Vaseline to the right breast wound. ? ?We will have her return to work on Monday, 06/27/2021 for full-time if she needs to work half days due to stamina after surgery, we can adjust as needed.  Patient provided with return to work letter today. ? ? ?

## 2021-06-29 ENCOUNTER — Encounter: Payer: Self-pay | Admitting: Plastic Surgery

## 2021-06-29 ENCOUNTER — Ambulatory Visit: Payer: BC Managed Care – PPO | Attending: General Surgery

## 2021-06-29 DIAGNOSIS — C50412 Malignant neoplasm of upper-outer quadrant of left female breast: Secondary | ICD-10-CM | POA: Diagnosis present

## 2021-06-29 DIAGNOSIS — Z170 Estrogen receptor positive status [ER+]: Secondary | ICD-10-CM | POA: Insufficient documentation

## 2021-06-29 DIAGNOSIS — R293 Abnormal posture: Secondary | ICD-10-CM | POA: Insufficient documentation

## 2021-06-29 DIAGNOSIS — Z483 Aftercare following surgery for neoplasm: Secondary | ICD-10-CM | POA: Diagnosis present

## 2021-06-29 NOTE — Progress Notes (Addendum)
Patient is a 41 year old female with PMH of left-sided breast cancer s/p bilateral breast reconstruction performed 05/12/2021 by Dr. Marla Roe.  Unfortunately her recovery was complicated by bilateral nipple and mastectomy flap necrosis.  She required debridement and reclosure on 06/15/2021.  Wound matrix was placed followed by Sorbact and K-Y jelly and gauze. ? ?She was seen most recently here in clinic on 06/16/2021.  At that time, she was feeling improved.  Right NAC appeared to be healing nicely.  Some exposed granulation tissue.  On left breast, Sorbact remained in place.  No skin necrosis or subcutaneous fluid collections appreciated.  Plan is for continued K-Y daily dressing changes to the left breast wound and Vaseline to the right breast wound. ? ?Today, patient is doing well.  She started physical therapy a couple of days ago and that has been helping considerably with her symptoms.  She states that she has been dressing her left-sided wound with K-Y jelly over the Sorbact twice daily followed by gauze/pad and secured by her breast binder.  She states that she prefers the binder over her compressive sports bra.  She has noticed a little bit of yellowish drainage from the left-sided wound site.  Denies erythema or worsening pain symptoms. ? ?Physical exam is reassuring.  Breasts without erythema.  Left-sided wound site with Sorbact still intact, secured with stitches.  No purulent drainage or malodor.  Underneath the Darlis Wragg Sorbact, I do appreciate an area of slough superiorly.  The wound is approximately 4 x 4 x 0.5 cm.  No surrounding superficial skin necrosis noted on exam. ? ?We will continue to abstain from expander fills at this time.  We will also hold off on removing the Sorbact and instead place orders for Adaptic and K-Y jelly with Prism for her to start using next week after she follows up in clinic and has the Sorbact removed. ? ?Her next postop visit has already been scheduled.  She will call the  clinic should she develop any new or worsening symptoms in the interim.  Picture(s) obtained of the patient and placed in the chart were with the patient's or guardian's permission. ? ?

## 2021-06-29 NOTE — Therapy (Signed)
?OUTPATIENT PHYSICAL THERAPY TREATMENT NOTE ? ? ?Patient Name: Haley Evans ?MRN: [id number] ?DOB:Dec 25, 1980, 41 y.o., female ?Today's Date: 06/29/2021 ? ? PT End of Session - 06/29/21 1111   ? ? Visit Number 3   ? Number of Visits 10   ? Date for PT Re-Evaluation 08/08/21   ? PT Start Time 1102   ? PT Stop Time 1200   ? PT Time Calculation (min) 58 min   ? Activity Tolerance Patient tolerated treatment well   ? Behavior During Therapy [facility] for tasks assessed/performed   ? ?  ?  ? ?  ? ? ? ?Past Medical History:  ?Diagnosis Date  ? Anemia   ? Anxiety   ? Bipolar 2 disorder ([place])   ? Cancer [facility])   ? breast  ? Depression   ? Family history of adverse reaction to anesthesia   ? aunt woke up during hip replacement  ? Seizure ([place])   ? vs syncopy, age 71 and 12/2017, Seen by neurologist, a definitive diagnosis was not made.  ? ?Past Surgical History:  ?Procedure Laterality Date  ? ARM WOUND REPAIR / CLOSURE Right   ? fractured arm  ? BREAST RECONSTRUCTION WITH PLACEMENT OF TISSUE EXPANDER AND FLEX HD (ACELLULAR HYDRATED DERMIS) Left 06/15/2021  ? Procedure: REPLACEMENT OF TISSUE EXPANDER AND FLEX HD (ACELLULAR HYDRATED DERMIS);  Surgeon: Wallace Going, DO;  Location: [place];  Service: Plastics;  Laterality: Left;  ? CESAREAN SECTION    ? DEBRIDEMENT AND CLOSURE WOUND Left 06/15/2021  ? Procedure: DEBRIDEMENT AND CLOSURE LEFT BREAST WOUND;  Surgeon: Wallace Going, DO;  Location: [place];  Service: Plastics;  Laterality: Left;  1 hour  ? NIPPLE SPARING MASTECTOMY Right 05/12/2021  ? Procedure: RIGHT NIPPLE SPARING MASTECTOMY;  Surgeon: Jovita Kussmaul, MD;  Location: [place];  Service: General;  Laterality: Right;  ? NIPPLE SPARING MASTECTOMY WITH SENTINEL LYMPH NODE BIOPSY Left 05/12/2021  ? Procedure: LEFT NIPPLE SPARING MASTECTOMY WITH LEFT SENTINEL LYMPH NODE BIOPSY;  Surgeon: Jovita Kussmaul, MD;  Location: [place];  Service: General;  Laterality: Left;  ? PRE-PECTORAL BREAST  RECON W/ PLACEMENT OF TISSUE EXPANDERS, DERMAL GRAFT Bilateral 05/12/2021  ? Procedure: IMMEDIATE BILATERAL BREAST RECONSTRUCTION WITH PLACEMENT OF TISSUE EXPANDERS AND FLEX HD;  Surgeon: Wallace Going, DO;  Location: [place];  Service: Plastics;  Laterality: Bilateral;  ? ?Patient Active Problem List  ? Diagnosis Date Noted  ? Breast cancer ([place]) 05/12/2021  ? Genetic testing 03/18/2021  ? Family history of pancreatic cancer 03/10/2021  ? Malignant neoplasm of upper-outer quadrant of left breast in female, estrogen receptor positive ([place]) 03/07/2021  ? Anxiety 06/25/2011  ? ? ?PCP: Berkley Harvey, NP ? ?REFERRING PROVIDER: Jovita Kussmaul, MD ? ?REFERRING DIAG: Left breast cancer ? ?THERAPY DIAG:  ?Malignant neoplasm of upper-outer quadrant of left breast in female, estrogen receptor positive ([place]) ? ?Abnormal posture ? ?Aftercare following surgery for neoplasm ? ?ONSET DATE: 05/12/2021 ? ?SUBJECTIVE:                                                                                                                                                                                          ? ?  SUBJECTIVE STATEMENT: ?I did have the second surgery on 4/19. They did not remove my expanders. The right side has healed well, the left is much slower to heal and the doctor said P/ROM is okay, but to be gentle and not create pull at the incision. Dr. Marlou Starks started me on muscle relaxers and they seem to be really helping. I have a follow up visit with Dr. Marla Roe Friday. ? ?PERTINENT HISTORY:  ?Patient was diagnosed on 01/12/2021 with left grade II invasive ductal carcinoma breast cancer. She underwent a bilateral nipple sparing mastectomy and left sentinel node biopsy (3 negative nodes) on 05/12/2021 with expanders placed for reconstruction. It is ER/PR positive and HER2 negative with a Ki67 of 1%.  ? ?PATIENT GOALS:  Reassess how my recovery is going related to arm function, pain, and swelling. ? ?PAIN:   ?Are you having pain? No, just very tight across chest ? ?PRECAUTIONS: Recent Surgery, left UE Lymphedema risk, Other: Precautions related to breast reconstruction ? ?ACTIVITY LEVEL / LEISURE: She is walking 30 minutes per day ? ? ?OBJECTIVE:  ? ?PATIENT SURVEYS:  ?QUICK DASH: ? ? ? ? ?OBSERVATIONS: ? Left breast with necrotic tissue present (see photo taken by plastic surgeon's office today). Inferior breast incisions still has steri-strips present. Right nipple appears very red and has some bleeding. Left breast appears very red with significant edema present. ? ?POSTURE:  ?Forward head and rounded shoulders posture ?  ?UPPER EXTREMITY AROM/PROM: ?  ?A/PROM Right ?03/09/2021 Left ?03/09/2021 Right ?06/13/2021 Left ?06/13/2021  ?Shoulder extension 36 33 50 36  ?Shoulder flexion 149 146 92 77  ?Shoulder abduction 161 159 113 71  ?Shoulder internal rotation 85 74 64 NT  ?Shoulder external rotation 89 88 82 NT  ?                        (Blank rows = not tested) ?  ?  ?  ?CERVICAL AROM: ?All within normal limits  ?  ?   ?LYMPHEDEMA ASSESSMENTS:  ?  ?LANDMARK RIGHT ?03/09/2021 LEFT ?03/09/2021 RIGHT ?06/13/2021 LEFT ?06/13/2021  ?10 cm proximal to olecranon process 28.4 27.9 28.3 28  ?Olecranon process 25 24.7 25.3 24.8  ?10 cm proximal to ulnar styloid process 21.3 20.5 20.8 20  ?Just proximal to ulnar styloid process 15.8 16 15.3 15.3  ?Across hand at thumb web space 20 19.3 19.8 19.3  ?At base of 2nd digit 6.6 6.5 6.3 6.2  ?(Blank rows = not tested) ?  ?Today's Treatment: ?06/29/21: ?Manual Therapy: ?P/ROM: In Supine to bil shoulders into flexion, abduction and er gently and in available motions. Myofascial blocking at bil axillae at end motions, especially on Lt side, to prevent any pulling at incision. Pt reports not feeling any pull there throughout session.  ?MFR: To Lt axilla where cording is beginning to be palpable but gently and ensuring no pull at breast incision.  ? ?  ?Surgery type/Date: 05/12/2021 bil mastectomy,  left sentinel node biopsy; bil expanders placed ?Number of lymph nodes removed: 3 ?Current/past treatment (chemo, radiation, hormone therapy): none ?Other symptoms:  ?Heaviness/tightness Yes ?Pain Yes ?Pitting edema No ?Infections No ?Decreased scar mobility Yes ?Stemmer sign No ? ? ?PATIENT EDUCATION:  ?Education details: HEP and lymphedema risk reduction ?Person educated: Patient ?Education method: Explanation, Demonstration, and Handouts ?Education comprehension: verbalized understanding and returned demonstration ? ? ?HOME EXERCISE PROGRAM: ? Reviewed previously given post op HEP. ? ? ?ASSESSMENT: ? ?CLINICAL IMPRESSION: ?Patient had debridement surgery 4/19. She  reports her Rt breast is healing very well, but the Lt is slow to heal as this had necrotic tissue present. She reports MD okayed gentle P/ROM as long as no pulling at incisions so this was monitored throughout session today and pt reports feeling good stretches with all without any pulling at incisions. She also reports feeling looser by end of session. She sees Dr. Marla Roe for a follow up surgery Friday, 07/01/21. ? ?Pt will benefit from skilled therapeutic intervention to improve on the following deficits: Decreased knowledge of precautions, impaired UE functional use, pain, decreased ROM, postural dysfunction.  ? ?PT treatment/interventions: ADL/Self care home management, Therapeutic exercises, Therapeutic activity, Neuromuscular re-education, Balance training, Gait training, Patient/Family education, Joint mobilization, scar mobilization, and Manual therapy ? ? ? ? ?GOALS: ?Goals reviewed with patient? Yes ? ?LONG TERM GOALS:  (STG=LTG) ? ?GOALS Name Target Date Goal status  ?1 Pt will demonstrate she has regained full shoulder ROM and function post operatively compared to baselines.  ?Baseline: 08/08/2021 IN PROGRESS  ?2 Patient will increase bilateral shoulder flexion to >/= 120 degrees for increased ease reaching overhead. 08/24/2021 INITIAL  ?3  Patient will increase bilateral shoulder abduction to >/= 120 degrees for increased ease reaching overhead. 08/24/2021 INITIAL  ?4 Patient will report >/= 25% reduction in pain and swelling to tolerate dai

## 2021-07-01 ENCOUNTER — Ambulatory Visit (INDEPENDENT_AMBULATORY_CARE_PROVIDER_SITE_OTHER): Payer: BC Managed Care – PPO | Admitting: Physician Assistant

## 2021-07-01 ENCOUNTER — Encounter: Payer: Self-pay | Admitting: Physician Assistant

## 2021-07-01 DIAGNOSIS — C50412 Malignant neoplasm of upper-outer quadrant of left female breast: Secondary | ICD-10-CM

## 2021-07-01 DIAGNOSIS — Z170 Estrogen receptor positive status [ER+]: Secondary | ICD-10-CM

## 2021-07-05 ENCOUNTER — Encounter: Payer: Self-pay | Admitting: Physical Therapy

## 2021-07-05 ENCOUNTER — Telehealth: Payer: Self-pay | Admitting: *Deleted

## 2021-07-05 ENCOUNTER — Ambulatory Visit: Payer: BC Managed Care – PPO | Admitting: Physical Therapy

## 2021-07-05 DIAGNOSIS — Z483 Aftercare following surgery for neoplasm: Secondary | ICD-10-CM

## 2021-07-05 DIAGNOSIS — R293 Abnormal posture: Secondary | ICD-10-CM

## 2021-07-05 DIAGNOSIS — C50412 Malignant neoplasm of upper-outer quadrant of left female breast: Secondary | ICD-10-CM

## 2021-07-05 NOTE — Telephone Encounter (Signed)
Faxed order to [place].  Along with Demographics,insurance card, and recent office notes.  Confirmation received and copy scanned into the chart.//AB/CMA ?

## 2021-07-05 NOTE — Therapy (Signed)
?OUTPATIENT PHYSICAL THERAPY TREATMENT NOTE ? ? ?Patient Name: Haley Evans ?MRN: [id number] ?DOB:1980-08-02, 41 y.o., female ?Today's Date: 07/05/2021 ? ? PT End of Session - 07/05/21 1555   ? ? Visit Number 4   ? Number of Visits 10   ? Date for PT Re-Evaluation 08/08/21   ? PT Start Time 1502   ? PT Stop Time 5366   ? PT Time Calculation (min) 50 min   ? Activity Tolerance Patient tolerated treatment well   ? Behavior During Therapy [facility] for tasks assessed/performed   ? ?  ?  ? ?  ? ? ? ? ?Past Medical History:  ?Diagnosis Date  ? Anemia   ? Anxiety   ? Bipolar 2 disorder ([place])   ? Cancer [facility])   ? breast  ? Depression   ? Family history of adverse reaction to anesthesia   ? aunt woke up during hip replacement  ? Seizure ([place])   ? vs syncopy, age 36 and 12/2017, Seen by neurologist, a definitive diagnosis was not made.  ? ?Past Surgical History:  ?Procedure Laterality Date  ? ARM WOUND REPAIR / CLOSURE Right   ? fractured arm  ? BREAST RECONSTRUCTION WITH PLACEMENT OF TISSUE EXPANDER AND FLEX HD (ACELLULAR HYDRATED DERMIS) Left 06/15/2021  ? Procedure: REPLACEMENT OF TISSUE EXPANDER AND FLEX HD (ACELLULAR HYDRATED DERMIS);  Surgeon: Wallace Going, DO;  Location: [place];  Service: Plastics;  Laterality: Left;  ? CESAREAN SECTION    ? DEBRIDEMENT AND CLOSURE WOUND Left 06/15/2021  ? Procedure: DEBRIDEMENT AND CLOSURE LEFT BREAST WOUND;  Surgeon: Wallace Going, DO;  Location: [place];  Service: Plastics;  Laterality: Left;  1 hour  ? NIPPLE SPARING MASTECTOMY Right 05/12/2021  ? Procedure: RIGHT NIPPLE SPARING MASTECTOMY;  Surgeon: Jovita Kussmaul, MD;  Location: [place];  Service: General;  Laterality: Right;  ? NIPPLE SPARING MASTECTOMY WITH SENTINEL LYMPH NODE BIOPSY Left 05/12/2021  ? Procedure: LEFT NIPPLE SPARING MASTECTOMY WITH LEFT SENTINEL LYMPH NODE BIOPSY;  Surgeon: Jovita Kussmaul, MD;  Location: [place];  Service: General;  Laterality: Left;  ? PRE-PECTORAL  BREAST RECON W/ PLACEMENT OF TISSUE EXPANDERS, DERMAL GRAFT Bilateral 05/12/2021  ? Procedure: IMMEDIATE BILATERAL BREAST RECONSTRUCTION WITH PLACEMENT OF TISSUE EXPANDERS AND FLEX HD;  Surgeon: Wallace Going, DO;  Location: [place];  Service: Plastics;  Laterality: Bilateral;  ? ?Patient Active Problem List  ? Diagnosis Date Noted  ? Breast cancer ([place]) 05/12/2021  ? Genetic testing 03/18/2021  ? Family history of pancreatic cancer 03/10/2021  ? Malignant neoplasm of upper-outer quadrant of left breast in female, estrogen receptor positive ([place]) 03/07/2021  ? Anxiety 06/25/2011  ? ? ?PCP: Berkley Harvey, NP ? ?REFERRING PROVIDER: Jovita Kussmaul, MD ? ?REFERRING DIAG: Left breast cancer ? ?THERAPY DIAG:  ?Aftercare following surgery for neoplasm ? ?Abnormal posture ? ?Malignant neoplasm of upper-outer quadrant of left breast in female, estrogen receptor positive ([place]) ? ?ONSET DATE: 05/12/2021 ? ?SUBJECTIVE:                                                                                                                                                                                          ? ?  SUBJECTIVE STATEMENT: ?The doctor said the same thing. Just PROM for another week. I have been having bee sting like pain where the tube was. It does not feel like nerve pain.  ? ?PERTINENT HISTORY:  ?Patient was diagnosed on 01/12/2021 with left grade II invasive ductal carcinoma breast cancer. She underwent a bilateral nipple sparing mastectomy and left sentinel node biopsy (3 negative nodes) on 05/12/2021 with expanders placed for reconstruction. It is ER/PR positive and HER2 negative with a Ki67 of 1%.  ? ?PATIENT GOALS:  Reassess how my recovery is going related to arm function, pain, and swelling. ? ?PAIN:  ?Are you having pain? No, just very tight across chest ? ?PRECAUTIONS: Recent Surgery, left UE Lymphedema risk, Other: Precautions related to breast reconstruction ? ?ACTIVITY LEVEL / LEISURE: She  is walking 30 minutes per day ? ? ?OBJECTIVE:  ? ?PATIENT SURVEYS:  ?QUICK DASH: ? ? ? ? ?OBSERVATIONS: ? Left breast with necrotic tissue present (see photo taken by plastic surgeon's office today). Inferior breast incisions still has steri-strips present. Right nipple appears very red and has some bleeding. Left breast appears very red with significant edema present. ? ?POSTURE:  ?Forward head and rounded shoulders posture ?  ?UPPER EXTREMITY AROM/PROM: ?  ?A/PROM Right ?03/09/2021 Left ?03/09/2021 Right ?06/13/2021 Left ?06/13/2021  ?Shoulder extension 36 33 50 36  ?Shoulder flexion 149 146 92 77  ?Shoulder abduction 161 159 113 71  ?Shoulder internal rotation 85 74 64 NT  ?Shoulder external rotation 89 88 82 NT  ?                        (Blank rows = not tested) ?  ?  ?  ?CERVICAL AROM: ?All within normal limits  ?  ?   ?LYMPHEDEMA ASSESSMENTS:  ?  ?LANDMARK RIGHT ?03/09/2021 LEFT ?03/09/2021 RIGHT ?06/13/2021 LEFT ?06/13/2021  ?10 cm proximal to olecranon process 28.4 27.9 28.3 28  ?Olecranon process 25 24.7 25.3 24.8  ?10 cm proximal to ulnar styloid process 21.3 20.5 20.8 20  ?Just proximal to ulnar styloid process 15.8 16 15.3 15.3  ?Across hand at thumb web space 20 19.3 19.8 19.3  ?At base of 2nd digit 6.6 6.5 6.3 6.2  ?(Blank rows = not tested) ?  ?Today's Treatment: ?07/05/21: ?Manual Therapy: ?P/ROM: In Supine to bil shoulders into flexion, abduction and er gently and in available motions. Pt reports not feeling any pull at incision throughout session.  ?STM: in L S/L to R lateral edge of lats and serratus using cocoa butter where pt has increased muscle tightness and palpable knots ? ?06/29/21: ?Manual Therapy: ?P/ROM: In Supine to bil shoulders into flexion, abduction and er gently and in available motions. Myofascial blocking at bil axillae at end motions, especially on Lt side, to prevent any pulling at incision. Pt reports not feeling any pull there throughout session.  ?MFR: To Lt axilla where cording is  beginning to be palpable but gently and ensuring no pull at breast incision.  ? ?  ?Surgery type/Date: 05/12/2021 bil mastectomy, left sentinel node biopsy; bil expanders placed ?Number of lymph nodes removed: 3 ?Current/past treatment (chemo, radiation, hormone therapy): none ?Other symptoms:  ?Heaviness/tightness Yes ?Pain Yes ?Pitting edema No ?Infections No ?Decreased scar mobility Yes ?Stemmer sign No ? ? ?PATIENT EDUCATION:  ?Education details: HEP and lymphedema risk reduction ?Person educated: Patient ?Education method: Explanation, Demonstration, and Handouts ?Education comprehension: verbalized understanding and returned demonstration ? ? ?HOME EXERCISE PROGRAM: ? Reviewed  previously given post op HEP. ? ? ?ASSESSMENT: ? ?CLINICAL IMPRESSION: ?Pt saw her doctor and was advised to only continue with gentle PROM being careful not to pull incision. Therapist continued with PROM to bilateral shoulders in all directions with L more limited than R. Therapist was careful not to pull incision. Also added soft tissue mobilization to R lateral trunk in area of increased muscle tightness.  ? ?Pt will benefit from skilled therapeutic intervention to improve on the following deficits: Decreased knowledge of precautions, impaired UE functional use, pain, decreased ROM, postural dysfunction.  ? ?PT treatment/interventions: ADL/Self care home management, Therapeutic exercises, Therapeutic activity, Neuromuscular re-education, Balance training, Gait training, Patient/Family education, Joint mobilization, scar mobilization, and Manual therapy ? ? ? ? ?GOALS: ?Goals reviewed with patient? Yes ? ?LONG TERM GOALS:  (STG=LTG) ? ?GOALS Name Target Date Goal status  ?1 Pt will demonstrate she has regained full shoulder ROM and function post operatively compared to baselines.  ?Baseline: 08/08/2021 IN PROGRESS  ?2 Patient will increase bilateral shoulder flexion to >/= 120 degrees for increased ease reaching overhead. 08/30/2021  INITIAL  ?3 Patient will increase bilateral shoulder abduction to >/= 120 degrees for increased ease reaching overhead. 08/30/2021 INITIAL  ?4 Patient will report >/= 25% reduction in pain and swelling to tolerate

## 2021-07-07 ENCOUNTER — Telehealth: Payer: Self-pay | Admitting: *Deleted

## 2021-07-07 ENCOUNTER — Ambulatory Visit: Payer: BC Managed Care – PPO | Admitting: Physical Therapy

## 2021-07-07 ENCOUNTER — Ambulatory Visit (INDEPENDENT_AMBULATORY_CARE_PROVIDER_SITE_OTHER): Payer: BC Managed Care – PPO | Admitting: Surgical

## 2021-07-07 DIAGNOSIS — C50412 Malignant neoplasm of upper-outer quadrant of left female breast: Secondary | ICD-10-CM

## 2021-07-07 DIAGNOSIS — Z170 Estrogen receptor positive status [ER+]: Secondary | ICD-10-CM

## 2021-07-07 NOTE — Telephone Encounter (Signed)
Received on (07/05/21) via of fax Order Status Notification from [employer].  Stating:The patient's insurance is showing inactive.  [employer] will reach out to the patient to offer discounted pricing or updated insurance information.//AB/CMA ?

## 2021-07-08 NOTE — Progress Notes (Signed)
Patient is a 41 year old female here for follow-up after bilateral breast reconstruction with placement of tissue expanders with Dr. Marla Roe on 05/12/2021.  Unfortunately she developed bilateral mastectomy flap necrosis, subsequently underwent debridement of the left breast necrosis and placement of wound matrix on 06/15/2021.  She has been doing K-Y jelly dressing changes over Sorbact mesh dressing at home daily.  She feels as if things are going well.  She has been working with physical therapy to maintain adequate range of motion. ? ?She is accompanied by her husband today at bedside. ? ?Chaperone present on exam ?On exam right breast incision intact, right NAC is viable, completely epithelialized.  There is no subcutaneous fluid collections, no erythema or cellulitic changes.  No tenderness to palpation. ? ?On exam of the left breast, Sorbact is in place, this was removed to reveal some granulation tissue at the edges, wound is approximately 3 x 5 cm.  Centrally the myriad wound matrix is still present, not completely incorporated at this time.  Along the medial portion of the wound, the junction of the Flex HD and pectoralis muscle is visualized, I do not appreciate any exposure of the expander.  No erythema.  No cellulitic change.  No foul odor is noted. ? ?Donated ACell was placed over the left breast wound, this was covered with Adaptic, K-Y jelly, 4 x 4 gauze, Mepilex border dressing.  We discussed dressing changes with K-Y jelly daily.  Recommend following up next week for reevaluation.  Picture was taken and placed in the patient's chart with patient's permission.  Recommend calling with questions or concerns.  No signs of infection. ? ?

## 2021-07-11 ENCOUNTER — Ambulatory Visit: Payer: BC Managed Care – PPO | Admitting: Physician Assistant

## 2021-07-12 ENCOUNTER — Ambulatory Visit: Payer: BC Managed Care – PPO

## 2021-07-12 DIAGNOSIS — R293 Abnormal posture: Secondary | ICD-10-CM

## 2021-07-12 DIAGNOSIS — Z483 Aftercare following surgery for neoplasm: Secondary | ICD-10-CM

## 2021-07-12 DIAGNOSIS — C50412 Malignant neoplasm of upper-outer quadrant of left female breast: Secondary | ICD-10-CM | POA: Diagnosis not present

## 2021-07-12 NOTE — Therapy (Signed)
?OUTPATIENT PHYSICAL THERAPY TREATMENT NOTE ? ? ?Patient Name: Haley Evans ?MRN: [id number] ?DOB:[birth year], 41 y.o., female ?Today's Date: 07/12/2021 ? ? PT End of Session - 07/12/21 1106   ? ? Visit Number 5   ? Number of Visits 10   ? Date for PT Re-Evaluation 08/08/21   ? PT Start Time 1103   ? PT Stop Time 1200   ? PT Time Calculation (min) 57 min   ? Activity Tolerance Patient tolerated treatment well   ? Behavior During Therapy [facility] for tasks assessed/performed   ? ?  ?  ? ?  ? ? ? ? ?Past Medical History:  ?Diagnosis Date  ? Anemia   ? Anxiety   ? Bipolar 2 disorder ([place])   ? Cancer [facility])   ? breast  ? Depression   ? Family history of adverse reaction to anesthesia   ? aunt woke up during hip replacement  ? Seizure ([place])   ? vs syncopy, age 46 and 12/2017, Seen by neurologist, a definitive diagnosis was not made.  ? ?Past Surgical History:  ?Procedure Laterality Date  ? ARM WOUND REPAIR / CLOSURE Right   ? fractured arm  ? BREAST RECONSTRUCTION WITH PLACEMENT OF TISSUE EXPANDER AND FLEX HD (ACELLULAR HYDRATED DERMIS) Left 06/15/2021  ? Procedure: REPLACEMENT OF TISSUE EXPANDER AND FLEX HD (ACELLULAR HYDRATED DERMIS);  Surgeon: Wallace Going, DO;  Location: [place];  Service: Plastics;  Laterality: Left;  ? CESAREAN SECTION    ? DEBRIDEMENT AND CLOSURE WOUND Left 06/15/2021  ? Procedure: DEBRIDEMENT AND CLOSURE LEFT BREAST WOUND;  Surgeon: Wallace Going, DO;  Location: [place];  Service: Plastics;  Laterality: Left;  1 hour  ? NIPPLE SPARING MASTECTOMY Right 05/12/2021  ? Procedure: RIGHT NIPPLE SPARING MASTECTOMY;  Surgeon: Jovita Kussmaul, MD;  Location: [place];  Service: General;  Laterality: Right;  ? NIPPLE SPARING MASTECTOMY WITH SENTINEL LYMPH NODE BIOPSY Left 05/12/2021  ? Procedure: LEFT NIPPLE SPARING MASTECTOMY WITH LEFT SENTINEL LYMPH NODE BIOPSY;  Surgeon: Jovita Kussmaul, MD;  Location: [place];  Service: General;  Laterality: Left;  ? PRE-PECTORAL  BREAST RECON W/ PLACEMENT OF TISSUE EXPANDERS, DERMAL GRAFT Bilateral 05/12/2021  ? Procedure: IMMEDIATE BILATERAL BREAST RECONSTRUCTION WITH PLACEMENT OF TISSUE EXPANDERS AND FLEX HD;  Surgeon: Wallace Going, DO;  Location: [place];  Service: Plastics;  Laterality: Bilateral;  ? ?Patient Active Problem List  ? Diagnosis Date Noted  ? Breast cancer ([place]) 05/12/2021  ? Genetic testing 03/18/2021  ? Family history of pancreatic cancer 03/10/2021  ? Malignant neoplasm of upper-outer quadrant of left breast in female, estrogen receptor positive ([place]) 03/07/2021  ? Anxiety 06/25/2011  ? ? ?PCP: Berkley Harvey, NP ? ?REFERRING PROVIDER: Jovita Kussmaul, MD ? ?REFERRING DIAG: Left breast cancer ? ?THERAPY DIAG:  ?Aftercare following surgery for neoplasm ? ?Abnormal posture ? ?Malignant neoplasm of upper-outer quadrant of left breast in female, estrogen receptor positive ([place]) ? ?ONSET DATE: 05/12/2021 ? ?SUBJECTIVE:    My Rt side felt really good after Blaire worked on it last time. I see the doctor Thursday so we'll how my healing is going. Also I've noticed that driving is getting easier bc I'm not as tight.  ? ?PERTINENT HISTORY:  ?Patient was diagnosed on 01/12/2021 with left grade II invasive ductal carcinoma breast cancer. She underwent a bilateral nipple sparing mastectomy and left sentinel node biopsy (3 negative nodes) on 05/12/2021 with expanders placed for reconstruction.  It is ER/PR positive and HER2 negative with a Ki67 of 1%.  ? ?PATIENT GOALS:  Reassess how my recovery is going related to arm function, pain, and swelling. ? ?PAIN:  ?Are you having pain? No, just very tight across chest ? ?PRECAUTIONS: Recent Surgery, left UE Lymphedema risk, Other: Precautions related to breast reconstruction ? ?ACTIVITY LEVEL / LEISURE: She is walking 30 minutes per day ? ? ?OBJECTIVE:  ? ?PATIENT SURVEYS:  ?QUICK DASH: ? ? ? ? ?OBSERVATIONS: ? Left breast with necrotic tissue present (see photo taken  by plastic surgeon's office today). Inferior breast incisions still has steri-strips present. Right nipple appears very red and has some bleeding. Left breast appears very red with significant edema present. ? ?POSTURE:  ?Forward head and rounded shoulders posture ?  ?UPPER EXTREMITY AROM/PROM: ?  ?A/PROM Right ?03/09/2021 Left ?03/09/2021 Right ?06/13/2021 Left ?06/13/2021  ?Shoulder extension 36 33 50 36  ?Shoulder flexion 149 146 92 77  ?Shoulder abduction 161 159 113 71  ?Shoulder internal rotation 85 74 64 NT  ?Shoulder external rotation 89 88 82 NT  ?                        (Blank rows = not tested) ?  ?  ?  ?CERVICAL AROM: ?All within normal limits  ?  ?   ?LYMPHEDEMA ASSESSMENTS:  ?  ?LANDMARK RIGHT ?03/09/2021 LEFT ?03/09/2021 RIGHT ?06/13/2021 LEFT ?06/13/2021  ?10 cm proximal to olecranon process 28.4 27.9 28.3 28  ?Olecranon process 25 24.7 25.3 24.8  ?10 cm proximal to ulnar styloid process 21.3 20.5 20.8 20  ?Just proximal to ulnar styloid process 15.8 16 15.3 15.3  ?Across hand at thumb web space 20 19.3 19.8 19.3  ?At base of 2nd digit 6.6 6.5 6.3 6.2  ?(Blank rows = not tested) ?  ?Today's Treatment: ? ?07/12/21: ?Manual Therapy: ?P/ROM: In Supine to Lt>Rt shoulders into flexion and abduction gently and in available motions. Pt reports not feeling any pull at incision throughout session.  ?STM: in L S/L to R lateral edge of lats and serratus using cocoa butter where pt has increased muscle tightness and palpable knots, also to medial border of scapula and UT; blocking at pectoralis tendon during P/ROM to prevent pull at healing incision ? ?07/05/21: ?Manual Therapy: ?P/ROM: In Supine to bil shoulders into flexion, abduction and er gently and in available motions. Pt reports not feeling any pull at incision throughout session.  ?STM: in L S/L to R lateral edge of lats and serratus using cocoa butter where pt has increased muscle tightness and palpable knots ? ?06/29/21: ?Manual Therapy: ?P/ROM: In Supine to bil  shoulders into flexion, abduction and er gently and in available motions. Myofascial blocking at bil axillae at end motions, especially on Lt side, to prevent any pulling at incision. Pt reports not feeling any pull there throughout session.  ?MFR: To Lt axilla where cording is beginning to be palpable but gently and ensuring no pull at breast incision.  ? ?  ?Surgery type/Date: 05/12/2021 bil mastectomy, left sentinel node biopsy; bil expanders placed ?Number of lymph nodes removed: 3 ?Current/past treatment (chemo, radiation, hormone therapy): none ?Other symptoms:  ?Heaviness/tightness Yes ?Pain Yes ?Pitting edema No ?Infections No ?Decreased scar mobility Yes ?Stemmer sign No ? ? ?PATIENT EDUCATION:  ?Education details: HEP and lymphedema risk reduction ?Person educated: Patient ?Education method: Explanation, Demonstration, and Handouts ?Education comprehension: verbalized understanding and returned demonstration ? ? ?HOME EXERCISE PROGRAM: ?  Reviewed previously given post op HEP. ? ? ?ASSESSMENT: ? ?CLINICAL IMPRESSION: ?Continued with P/ROM of bil shoulders ensuring no pull at Lt healing mastectomy incision. Pt reports not feeling pull here during session. Her end Rt shoulder P/ROM is near full and her Lt shoulder motion has also improved well, but less so due to current healing incision and she has to limit her A/ROM here for now per MD. She goes back to doctor again after next session Thursday for them to reassess healing.  ? ?Pt will benefit from skilled therapeutic intervention to improve on the following deficits: Decreased knowledge of precautions, impaired UE functional use, pain, decreased ROM, postural dysfunction.  ? ?PT treatment/interventions: ADL/Self care home management, Therapeutic exercises, Therapeutic activity, Neuromuscular re-education, Balance training, Gait training, Patient/Family education, Joint mobilization, scar mobilization, and Manual therapy ? ? ? ? ?GOALS: ?Goals reviewed with  patient? Yes ? ?LONG TERM GOALS:  (STG=LTG) ? ?GOALS Name Target Date Goal status  ?1 Pt will demonstrate she has regained full shoulder ROM and function post operatively compared to baselines.  ?Baseline:

## 2021-07-14 ENCOUNTER — Other Ambulatory Visit: Payer: Self-pay

## 2021-07-14 ENCOUNTER — Ambulatory Visit (INDEPENDENT_AMBULATORY_CARE_PROVIDER_SITE_OTHER): Payer: BC Managed Care – PPO | Admitting: Student

## 2021-07-14 ENCOUNTER — Ambulatory Visit: Payer: BC Managed Care – PPO

## 2021-07-14 ENCOUNTER — Encounter (HOSPITAL_COMMUNITY): Payer: Self-pay | Admitting: Plastic Surgery

## 2021-07-14 DIAGNOSIS — C50412 Malignant neoplasm of upper-outer quadrant of left female breast: Secondary | ICD-10-CM | POA: Diagnosis not present

## 2021-07-14 DIAGNOSIS — R293 Abnormal posture: Secondary | ICD-10-CM

## 2021-07-14 DIAGNOSIS — Z170 Estrogen receptor positive status [ER+]: Secondary | ICD-10-CM

## 2021-07-14 DIAGNOSIS — Z483 Aftercare following surgery for neoplasm: Secondary | ICD-10-CM

## 2021-07-14 NOTE — Therapy (Signed)
OUTPATIENT PHYSICAL THERAPY TREATMENT NOTE   Patient Name: Haley Evans MRN: [id number] DOB:1981-01-13, 41 y.o., female Today's Date: 07/14/2021   PT End of Session - 07/14/21 1108     Visit Number 6    Number of Visits 10    Date for PT Re-Evaluation 08/08/21    PT Start Time 1103    PT Stop Time 1159    PT Time Calculation (min) 56 min    Activity Tolerance Patient tolerated treatment well    Behavior During Therapy WFL for tasks assessed/performed               Past Medical History:  Diagnosis Date   Anemia    Anxiety    Bipolar 2 disorder ([place])    Cancer ([place])    breast   Depression    Family history of adverse reaction to anesthesia    aunt woke up during hip replacement   Seizure ([place])    vs syncopy, age 89 and 12/2017, Seen by neurologist, a definitive diagnosis was not made.   Past Surgical History:  Procedure Laterality Date   ARM WOUND REPAIR / CLOSURE Right    fractured arm   BREAST RECONSTRUCTION WITH PLACEMENT OF TISSUE EXPANDER AND FLEX HD (ACELLULAR HYDRATED DERMIS) Left 06/15/2021   Procedure: REPLACEMENT OF TISSUE EXPANDER AND FLEX HD (ACELLULAR HYDRATED DERMIS);  Surgeon: Wallace Going, DO;  Location: [place];  Service: Plastics;  Laterality: Left;   CESAREAN SECTION     DEBRIDEMENT AND CLOSURE WOUND Left 06/15/2021   Procedure: DEBRIDEMENT AND CLOSURE LEFT BREAST WOUND;  Surgeon: Wallace Going, DO;  Location: [place];  Service: Plastics;  Laterality: Left;  1 hour   NIPPLE SPARING MASTECTOMY Right 05/12/2021   Procedure: RIGHT NIPPLE SPARING MASTECTOMY;  Surgeon: Jovita Kussmaul, MD;  Location: [place];  Service: General;  Laterality: Right;   NIPPLE SPARING MASTECTOMY WITH SENTINEL LYMPH NODE BIOPSY Left 05/12/2021   Procedure: LEFT NIPPLE SPARING MASTECTOMY WITH LEFT SENTINEL LYMPH NODE BIOPSY;  Surgeon: Jovita Kussmaul, MD;  Location: [place];  Service: General;  Laterality: Left;   PRE-PECTORAL  BREAST RECON W/ PLACEMENT OF TISSUE EXPANDERS, DERMAL GRAFT Bilateral 05/12/2021   Procedure: IMMEDIATE BILATERAL BREAST RECONSTRUCTION WITH PLACEMENT OF TISSUE EXPANDERS AND FLEX HD;  Surgeon: Wallace Going, DO;  Location: [place];  Service: Plastics;  Laterality: Bilateral;   Patient Active Problem List   Diagnosis Date Noted   Breast cancer ([place]) 05/12/2021   Genetic testing 03/18/2021   Family history of pancreatic cancer 03/10/2021   Malignant neoplasm of upper-outer quadrant of left breast in female, estrogen receptor positive ([place]) 03/07/2021   Anxiety 06/25/2011    PCP: Berkley Harvey, NP  REFERRING PROVIDER: Jovita Kussmaul, MD  REFERRING DIAG: Left breast cancer  THERAPY DIAG:  Aftercare following surgery for neoplasm  Abnormal posture  Malignant neoplasm of upper-outer quadrant of left breast in female, estrogen receptor positive ([place])  ONSET DATE: 05/12/2021  SUBJECTIVE:    I was a little sore at my Lt trunk after last session but I think it's just from the stretches, it feels better today. Today my Lt chest feels a little tight. My Rt side side is doing great. I see Dr. Marlou Starks and then Dr. Elenor Quinones PA today.   PERTINENT HISTORY:  Patient was diagnosed on 01/12/2021 with left grade II invasive ductal carcinoma breast cancer. She underwent a bilateral nipple sparing mastectomy and left sentinel node  biopsy (3 negative nodes) on 05/12/2021 with expanders placed for reconstruction. It is ER/PR positive and HER2 negative with a Ki67 of 1%.   PATIENT GOALS:  Reassess how my recovery is going related to arm function, pain, and swelling.  PAIN:  Are you having pain? No, just very tight across chest  PRECAUTIONS: Recent Surgery, left UE Lymphedema risk, Other: Precautions related to breast reconstruction  ACTIVITY LEVEL / LEISURE: She is walking 30 minutes per day   OBJECTIVE:   PATIENT SURVEYS:  QUICK DASH:     OBSERVATIONS:  Left breast  with necrotic tissue present (see photo taken by plastic surgeon's office today). Inferior breast incisions still has steri-strips present. Right nipple appears very red and has some bleeding. Left breast appears very red with significant edema present.  POSTURE:  Forward head and rounded shoulders posture   UPPER EXTREMITY AROM/PROM:   A/PROM Right 03/09/2021 Left 03/09/2021 Right 06/13/2021 Left 06/13/2021  Shoulder extension 36 33 50 36  Shoulder flexion 149 146 92 77  Shoulder abduction 161 159 113 71  Shoulder internal rotation 85 74 64 NT  Shoulder external rotation 89 88 82 NT                          (Blank rows = not tested)       CERVICAL AROM: All within normal limits       LYMPHEDEMA ASSESSMENTS:    LANDMARK RIGHT 03/09/2021 LEFT 03/09/2021 RIGHT 06/13/2021 LEFT 06/13/2021  10 cm proximal to olecranon process 28.4 27.9 28.3 28  Olecranon process 25 24.7 25.3 24.8  10 cm proximal to ulnar styloid process 21.3 20.5 20.8 20  Just proximal to ulnar styloid process 15.8 16 15.3 15.3  Across hand at thumb web space 20 19.3 19.8 19.3  At base of 2nd digit 6.6 6.5 6.3 6.2  (Blank rows = not tested)   Today's Treatment: 07/14/21: Therapeutic Exercises: Pulleys: Flex and Scaption x2 mins each returning therapist demo, VCs to decrease Lt shoulder hike, and cuing to limit Lt shoulder end ROM to prevent pulling at healing incision Ball roll up wall for Lt UE abd x12 returning therapist demo  Manual Therapy: P/ROM: In Supine to Lt>Rt shoulders into flexion and abduction gently and in available motions. Pt reports not feeling any pull at incision throughout session.  STM: In Supine gently to pectoralis insertion where pt reports increased muscle tightness today; blocking at pectoralis tendon during P/ROM to prevent pull at healing incision 07/12/21: Manual Therapy: P/ROM: In Supine to Lt>Rt shoulders into flexion and abduction gently and in available motions. Pt reports not  feeling any pull at incision throughout session.  STM: in L S/L to R lateral edge of lats and serratus using cocoa butter where pt has increased muscle tightness and palpable knots, also to medial border of scapula and UT; blocking at pectoralis tendon during P/ROM to prevent pull at healing incision  07/05/21: Manual Therapy: P/ROM: In Supine to bil shoulders into flexion, abduction and er gently and in available motions. Pt reports not feeling any pull at incision throughout session.  STM: in L S/L to R lateral edge of lats and serratus using cocoa butter where pt has increased muscle tightness and palpable knots    Surgery type/Date: 05/12/2021 bil mastectomy, left sentinel node biopsy; bil expanders placed Number of lymph nodes removed: 3 Current/past treatment (chemo, radiation, hormone therapy): none Other symptoms:  Heaviness/tightness Yes Pain Yes Pitting edema No Infections  No Decreased scar mobility Yes Stemmer sign No   PATIENT EDUCATION:  Education details: HEP and lymphedema risk reduction Person educated: Patient Education method: Explanation, Demonstration, and Handouts Education comprehension: verbalized understanding and returned demonstration   HOME EXERCISE PROGRAM:  Reviewed previously given post op HEP.   ASSESSMENT:  CLINICAL IMPRESSION: Began to gently progress pt to include AA/ROM exs with pulleys instructing her to limit end motion with Lt to prevent pulling at healing incision. Also had her roll ball up wall but with Rt only into abduction so no excessive stretching with Lt. Then continued with Lt>Rt P/ROM of bil shoulders and gentle STM to Lt pectoralis insertion where pt reports feeling increased tightness today. Also encouraged her to incorporate gentle A/ROM when at her desk at work when she feels she is tighter by the end of the day by doing pendulum swings with Lt leaning on Rt UE on the desk, and table slides into gentle flexion. Pt verbalized good  understanding. She sees both Research scientist (medical) today.   Pt will benefit from skilled therapeutic intervention to improve on the following deficits: Decreased knowledge of precautions, impaired UE functional use, pain, decreased ROM, postural dysfunction.   PT treatment/interventions: ADL/Self care home management, Therapeutic exercises, Therapeutic activity, Neuromuscular re-education, Balance training, Gait training, Patient/Family education, Joint mobilization, scar mobilization, and Manual therapy     GOALS: Goals reviewed with patient? Yes  LONG TERM GOALS:  (STG=LTG)  GOALS Name Target Date Goal status  1 Pt will demonstrate she has regained full shoulder ROM and function post operatively compared to baselines.  Baseline: 08/08/2021 IN PROGRESS  2 Patient will increase bilateral shoulder flexion to >/= 120 degrees for increased ease reaching overhead. 09/08/2021 INITIAL  3 Patient will increase bilateral shoulder abduction to >/= 120 degrees for increased ease reaching overhead. 09/08/2021 INITIAL  4 Patient will report >/= 25% reduction in pain and swelling to tolerate daily tasks with greater ease. 09/08/2021 INITIAL  5 Patient will improve her DASH score to be </= 8 for improved overall function. 08/08/2021 INITIAL     PLAN: PT FREQUENCY/DURATION: 2x/week for 4 weeks   PLAN FOR NEXT SESSION: How were doctor appts? Cont pulleys in flexion and scaption being mindful of not pulling at Lt incision. Cont PROM and AAROM exercises, progress as MD allows, benefits back from [facility]?       Collie Siad, PTA 07/14/21 12:21 PM

## 2021-07-14 NOTE — Progress Notes (Signed)
Patient is a 41 year old female with past medical history including breast cancer.  Patient underwent bilateral immediate breast reconstruction with placement of acellular dermal matrix and tissue expanders at Dr. Marla Roe on 05/12/2021.  At her postoperative visits shortly after the reconstruction there was an area of necrosis noted to the left NAC and the right nipple necrosis noted as well.  Since then, the right nipple necrosis has improved.  The left nipple necrosis did not improve and patient went back to the operating room with Dr. Marla Roe on 06/15/2021 for excision of left breast wound, skin, and soft tissue 2.5 x 5 cm and muscle 1 cm; placement of myriad 5 x 5 cm sheet; placement of Sorbact and repair of acellular dermal matrix 2 cm.  Since her surgery in April, patient has been doing well.  Patient has been doing daily KY jelly dressing changes to her left breast wound.  Her last visit in the clinic was 07/07/2021.  At this visit, patient was doing well.  The Sorbact was removed and granulation tissue was appreciated at the edges of the wound.  Donated ACell was placed over the left breast wound which was covered with Adaptic K-Y jelly 4 x 4 gauze and Mepilex border dressing.  Today, patient reports she is fairly well.  She states that she has been doing the dressing changes to her left breast wound successfully and has not noticed any malodor, drainage or erythema to her left breast.  She denies any fevers or chills.  She reports that she has had some pain after she does physical therapy.  She reports that it usually goes away with time and sometimes she takes Tylenol.  Chaperone present on exam.  On exam, left breast is soft and mildly swollen.  There is a small amount of fluid palpated.  Adaptic was removed from left breast wound.  The expander can be visualized through superior-medial aspect of the wound with retraction of the skin.  There is no erythema or malodor.  There is some serous  drainage.  Granulation tissue appreciated on the inferior, lateral, and lateral-superior aspects of the wound.  Flex HD vs fibrinous tissue noted centrally in the wound. Right breast is soft with no fluid collections noted.  Right NAC is viable.  No bruising or erythema noted to right breast.  Donated ACell powder was placed on the wound with Adaptic, K-Y jelly, gauze and Mepilex border dressing.  Discussed with patient that she should change the KY jelly and gauze daily and the Adaptic every other day to start on Saturday.   Discussed with patient that Dr. Marla Roe will be notified of the findings of today's visit and we will make a plan in regards to if the patient needs to return to the OR. Patient acknowledged.   Discussed with patient that she should hold off on her next PT session for now.   Patient to follow up next Wednesday in our clinic.   Patient seen with Verdie Shire PA-C  Picture was obtained and placed in patient's chart with patient's permission

## 2021-07-14 NOTE — Progress Notes (Signed)
Spoke with pt for pre-op call. Pt was seen here in April and March for surgery. She states nothing has changed with her medical or surgical history.   Shower instructions given to pt.   Pt states that Dr. Eusebio Friendly office told her to eat something before 8 AM then be NPO for surgery planned tomorrow afternoon. She states she has to see Dr. Marla Roe in the AM and it will be decided for sure if she is to have surgery at that visit.

## 2021-07-15 ENCOUNTER — Encounter: Payer: Self-pay | Admitting: Plastic Surgery

## 2021-07-15 ENCOUNTER — Encounter (HOSPITAL_COMMUNITY): Payer: Self-pay | Admitting: Certified Registered"

## 2021-07-15 ENCOUNTER — Ambulatory Visit (INDEPENDENT_AMBULATORY_CARE_PROVIDER_SITE_OTHER): Payer: BC Managed Care – PPO | Admitting: Plastic Surgery

## 2021-07-15 ENCOUNTER — Ambulatory Visit (HOSPITAL_COMMUNITY): Admission: RE | Admit: 2021-07-15 | Payer: BC Managed Care – PPO | Source: Home / Self Care | Admitting: Plastic Surgery

## 2021-07-15 ENCOUNTER — Encounter: Payer: Self-pay | Admitting: Physical Therapy

## 2021-07-15 DIAGNOSIS — C50412 Malignant neoplasm of upper-outer quadrant of left female breast: Secondary | ICD-10-CM

## 2021-07-15 DIAGNOSIS — S21002A Unspecified open wound of left breast, initial encounter: Secondary | ICD-10-CM | POA: Insufficient documentation

## 2021-07-15 DIAGNOSIS — Z170 Estrogen receptor positive status [ER+]: Secondary | ICD-10-CM

## 2021-07-15 SURGERY — IRRIGATION AND DEBRIDEMENT WOUND
Anesthesia: General | Laterality: Left

## 2021-07-15 MED ORDER — OXYCODONE HCL 5 MG PO TABS
5.0000 mg | ORAL_TABLET | Freq: Four times a day (QID) | ORAL | 0 refills | Status: AC | PRN
Start: 2021-07-15 — End: 2021-07-20

## 2021-07-15 MED ORDER — CEPHALEXIN 500 MG PO CAPS: 500.0000 mg | ORAL_CAPSULE | Freq: Four times a day (QID) | ORAL | 0 refills | Status: DC

## 2021-07-15 NOTE — Progress Notes (Signed)
Patient ID: Haley Evans, female    DOB: Apr 27, 1980, 41 y.o.   MRN: [id number]   Chief Complaint  Patient presents with   Follow-up    The patient is a 41 year old female here for follow-up on her breast reconstructions.  She had bilateral mastectomies April 19.  She had some skin loss on both sides.  The right side has recovered very nicely.  The left side is healing in but there is a separation between the skin breakdown and the breast pocket.  This is exposing the expander.  She understands that although she has stopped smoking the inhibition of blood flow during the critical.  May have contributed to the breakdown.  She has done an amazing job since prior to surgery and I think is going to get through this well.  We talked about different options and at this time I think that a latissimus muscle flap is her best option for reconstruction and salvage.   Review of Systems  Constitutional: Negative.   HENT: Negative.    Eyes: Negative.   Respiratory: Negative.    Cardiovascular: Negative.   Gastrointestinal: Negative.   Endocrine: Negative.   Genitourinary: Negative.   Skin:  Positive for color change and wound.   Past Medical History:  Diagnosis Date   Anemia    Anxiety    Bipolar 2 disorder ([place])    Cancer (HCC)    breast   Depression    Family history of adverse reaction to anesthesia    aunt woke up during hip replacement   Seizure ([place])    vs syncopy, age 22 and 12/2017, Seen by neurologist, a definitive diagnosis was not made.    Past Surgical History:  Procedure Laterality Date   ARM WOUND REPAIR / CLOSURE Right    fractured arm   BREAST RECONSTRUCTION WITH PLACEMENT OF TISSUE EXPANDER AND FLEX HD (ACELLULAR HYDRATED DERMIS) Left 06/15/2021   Procedure: REPLACEMENT OF TISSUE EXPANDER AND FLEX HD (ACELLULAR HYDRATED DERMIS);  Surgeon: Wallace Going, DO;  Location: [facility];  Service: Plastics;  Laterality: Left;   CESAREAN SECTION     DEBRIDEMENT AND  CLOSURE WOUND Left 06/15/2021   Procedure: DEBRIDEMENT AND CLOSURE LEFT BREAST WOUND;  Surgeon: Wallace Going, DO;  Location: [facility];  Service: Plastics;  Laterality: Left;  1 hour   NIPPLE SPARING MASTECTOMY Right 05/12/2021   Procedure: RIGHT NIPPLE SPARING MASTECTOMY;  Surgeon: Jovita Kussmaul, MD;  Location: [facility];  Service: General;  Laterality: Right;   NIPPLE SPARING MASTECTOMY WITH SENTINEL LYMPH NODE BIOPSY Left 05/12/2021   Procedure: LEFT NIPPLE SPARING MASTECTOMY WITH LEFT SENTINEL LYMPH NODE BIOPSY;  Surgeon: Jovita Kussmaul, MD;  Location: [facility];  Service: General;  Laterality: Left;   PRE-PECTORAL BREAST RECON W/ PLACEMENT OF TISSUE EXPANDERS, DERMAL GRAFT Bilateral 05/12/2021   Procedure: IMMEDIATE BILATERAL BREAST RECONSTRUCTION WITH PLACEMENT OF TISSUE EXPANDERS AND FLEX HD;  Surgeon: Wallace Going, DO;  Location: [facility];  Service: Plastics;  Laterality: Bilateral;      Current Outpatient Medications:    acetaminophen (TYLENOL) 500 MG tablet, Take 1,000 mg by mouth every 6 (six) hours as needed for moderate pain., Disp: , Rfl:    ALPRAZolam (XANAX) 0.5 MG tablet, TAKE 1 TABLET BY MOUTH TWICE DAILY AS NEEDED FOR ANXIETY, Disp: 10 tablet, Rfl: 0   B Complex-C (SUPER B COMPLEX PO), Take 1 tablet by mouth daily., Disp: , Rfl:  Bioflavonoid Products (SUPER C-500 COMPLEX) TBCR, Take 1 tablet by mouth daily., Disp: , Rfl:    BIOTIN PO, Take by mouth daily at 2 am., Disp: , Rfl:    gabapentin (NEURONTIN) 100 MG capsule, Take 100 mg by mouth 3 (three) times daily as needed for pain., Disp: , Rfl:    hydrOXYzine (ATARAX) 50 MG tablet, Take 50 mg by mouth at bedtime., Disp: , Rfl:    methocarbamol (ROBAXIN) 500 MG tablet, Take 1,000 mg by mouth 2 (two) times daily., Disp: , Rfl:    Misc Natural Products (YUMVS BEET ROOT-TART CHERRY PO), Take by mouth daily. Beet root supplement, Disp: , Rfl:    Multiple Vitamin  (MULTIVITAMIN WITH MINERALS) TABS tablet, Take 1 tablet by mouth daily., Disp: , Rfl:    Omega-3 Fatty Acids (FISH OIL PO), Take by mouth daily at 2 am., Disp: , Rfl:    TURMERIC PO, Take 1 capsule by mouth daily., Disp: , Rfl:    varenicline (CHANTIX) 1 MG tablet, Take 1 mg by mouth daily., Disp: , Rfl:    VITAMIN E PO, Take 1 capsule by mouth daily., Disp: , Rfl:    aspirin EC 81 MG tablet, Take 81 mg by mouth daily. Swallow whole. (Patient not taking: Reported on 07/15/2021), Disp: , Rfl:    diazepam (VALIUM) 2 MG tablet, Take 1 tablet (2 mg total) by mouth every 12 (twelve) hours as needed for muscle spasms. (Patient not taking: Reported on 07/15/2021), Disp: 20 tablet, Rfl: 0   ondansetron (ZOFRAN) 4 MG tablet, Take 1 tablet (4 mg total) by mouth every 8 (eight) hours as needed for nausea or vomiting. (Patient not taking: Reported on 07/14/2021), Disp: 20 tablet, Rfl: 0   tamoxifen (NOLVADEX) 10 MG tablet, Take 1 tablet (10 mg total) by mouth daily. (Patient not taking: Reported on 07/15/2021), Disp: 30 tablet, Rfl: 3   traMADol (ULTRAM) 50 MG tablet, Take 1 tablet (50 mg total) by mouth every 8 (eight) hours as needed for up to 10 doses. (Patient not taking: Reported on 07/15/2021), Disp: 10 tablet, Rfl: 0   Objective:   There were no vitals filed for this visit.  Physical Exam Vitals reviewed.  Constitutional:      Appearance: Normal appearance.  HENT:     Head: Normocephalic and atraumatic.  Cardiovascular:     Rate and Rhythm: Normal rate.     Pulses: Normal pulses.  Pulmonary:     Effort: Pulmonary effort is normal.  Skin:    General: Skin is warm.     Capillary Refill: Capillary refill takes less than 2 seconds.     Coloration: Skin is not jaundiced.     Findings: No bruising or lesion.  Neurological:     Mental Status: She is alert and oriented to person, place, and time.  Psychiatric:        Mood and Affect: Mood normal.        Behavior: Behavior normal.        Thought  Content: Thought content normal.    Assessment & Plan:  Malignant neoplasm of upper-outer quadrant of left breast in female, estrogen receptor positive ([place])  Open wound of left breast, initial encounter  Plan for removal of left breast expander with latissimus muscle flap and placement of a new expander.  Risks and complications were reviewed.  [place], DO

## 2021-07-15 NOTE — Progress Notes (Signed)
Spoke with pt yesterday for possible surgery today. Pt's surgery has been moved to Monday, 07/18/21. Updated pt with time of arrival for surgery on Monday. She states nothing has changed since we talked yesterday.

## 2021-07-15 NOTE — H&P (View-Only) (Signed)
Patient ID: Haley Evans, female    DOB: [birth year], 42 y.o.   MRN: [id number]   Chief Complaint  Patient presents with   Follow-up    The patient is a 41 year old female here for follow-up on her breast reconstructions.  She had bilateral mastectomies April 19.  She had some skin loss on both sides.  The right side has recovered very nicely.  The left side is healing in but there is a separation between the skin breakdown and the breast pocket.  This is exposing the expander.  She understands that although she has stopped smoking the inhibition of blood flow during the critical.  May have contributed to the breakdown.  She has done an amazing job since prior to surgery and I think is going to get through this well.  We talked about different options and at this time I think that a latissimus muscle flap is her best option for reconstruction and salvage.   Review of Systems  Constitutional: Negative.   HENT: Negative.    Eyes: Negative.   Respiratory: Negative.    Cardiovascular: Negative.   Gastrointestinal: Negative.   Endocrine: Negative.   Genitourinary: Negative.   Skin:  Positive for color change and wound.   Past Medical History:  Diagnosis Date   Anemia    Anxiety    Bipolar 2 disorder ([place])    Cancer (HCC)    breast   Depression    Family history of adverse reaction to anesthesia    aunt woke up during hip replacement   Seizure ([place])    vs syncopy, age 1 and 12/2017, Seen by neurologist, a definitive diagnosis was not made.    Past Surgical History:  Procedure Laterality Date   ARM WOUND REPAIR / CLOSURE Right    fractured arm   BREAST RECONSTRUCTION WITH PLACEMENT OF TISSUE EXPANDER AND FLEX HD (ACELLULAR HYDRATED DERMIS) Left 06/15/2021   Procedure: REPLACEMENT OF TISSUE EXPANDER AND FLEX HD (ACELLULAR HYDRATED DERMIS);  Surgeon: Wallace Going, DO;  Location: [facility];  Service: Plastics;  Laterality: Left;   CESAREAN SECTION     DEBRIDEMENT AND  CLOSURE WOUND Left 06/15/2021   Procedure: DEBRIDEMENT AND CLOSURE LEFT BREAST WOUND;  Surgeon: Wallace Going, DO;  Location: [facility];  Service: Plastics;  Laterality: Left;  1 hour   NIPPLE SPARING MASTECTOMY Right 05/12/2021   Procedure: RIGHT NIPPLE SPARING MASTECTOMY;  Surgeon: Jovita Kussmaul, MD;  Location: [facility];  Service: General;  Laterality: Right;   NIPPLE SPARING MASTECTOMY WITH SENTINEL LYMPH NODE BIOPSY Left 05/12/2021   Procedure: LEFT NIPPLE SPARING MASTECTOMY WITH LEFT SENTINEL LYMPH NODE BIOPSY;  Surgeon: Jovita Kussmaul, MD;  Location: [facility];  Service: General;  Laterality: Left;   PRE-PECTORAL BREAST RECON W/ PLACEMENT OF TISSUE EXPANDERS, DERMAL GRAFT Bilateral 05/12/2021   Procedure: IMMEDIATE BILATERAL BREAST RECONSTRUCTION WITH PLACEMENT OF TISSUE EXPANDERS AND FLEX HD;  Surgeon: Wallace Going, DO;  Location: [facility];  Service: Plastics;  Laterality: Bilateral;      Current Outpatient Medications:    acetaminophen (TYLENOL) 500 MG tablet, Take 1,000 mg by mouth every 6 (six) hours as needed for moderate pain., Disp: , Rfl:    ALPRAZolam (XANAX) 0.5 MG tablet, TAKE 1 TABLET BY MOUTH TWICE DAILY AS NEEDED FOR ANXIETY, Disp: 10 tablet, Rfl: 0   B Complex-C (SUPER B COMPLEX PO), Take 1 tablet by mouth daily., Disp: , Rfl:  Bioflavonoid Products (SUPER C-500 COMPLEX) TBCR, Take 1 tablet by mouth daily., Disp: , Rfl:    BIOTIN PO, Take by mouth daily at 2 am., Disp: , Rfl:    gabapentin (NEURONTIN) 100 MG capsule, Take 100 mg by mouth 3 (three) times daily as needed for pain., Disp: , Rfl:    hydrOXYzine (ATARAX) 50 MG tablet, Take 50 mg by mouth at bedtime., Disp: , Rfl:    methocarbamol (ROBAXIN) 500 MG tablet, Take 1,000 mg by mouth 2 (two) times daily., Disp: , Rfl:    Misc Natural Products (YUMVS BEET ROOT-TART CHERRY PO), Take by mouth daily. Beet root supplement, Disp: , Rfl:    Multiple Vitamin  (MULTIVITAMIN WITH MINERALS) TABS tablet, Take 1 tablet by mouth daily., Disp: , Rfl:    Omega-3 Fatty Acids (FISH OIL PO), Take by mouth daily at 2 am., Disp: , Rfl:    TURMERIC PO, Take 1 capsule by mouth daily., Disp: , Rfl:    varenicline (CHANTIX) 1 MG tablet, Take 1 mg by mouth daily., Disp: , Rfl:    VITAMIN E PO, Take 1 capsule by mouth daily., Disp: , Rfl:    aspirin EC 81 MG tablet, Take 81 mg by mouth daily. Swallow whole. (Patient not taking: Reported on 07/15/2021), Disp: , Rfl:    diazepam (VALIUM) 2 MG tablet, Take 1 tablet (2 mg total) by mouth every 12 (twelve) hours as needed for muscle spasms. (Patient not taking: Reported on 07/15/2021), Disp: 20 tablet, Rfl: 0   ondansetron (ZOFRAN) 4 MG tablet, Take 1 tablet (4 mg total) by mouth every 8 (eight) hours as needed for nausea or vomiting. (Patient not taking: Reported on 07/14/2021), Disp: 20 tablet, Rfl: 0   tamoxifen (NOLVADEX) 10 MG tablet, Take 1 tablet (10 mg total) by mouth daily. (Patient not taking: Reported on 07/15/2021), Disp: 30 tablet, Rfl: 3   traMADol (ULTRAM) 50 MG tablet, Take 1 tablet (50 mg total) by mouth every 8 (eight) hours as needed for up to 10 doses. (Patient not taking: Reported on 07/15/2021), Disp: 10 tablet, Rfl: 0   Objective:   There were no vitals filed for this visit.  Physical Exam Vitals reviewed.  Constitutional:      Appearance: Normal appearance.  HENT:     Head: Normocephalic and atraumatic.  Cardiovascular:     Rate and Rhythm: Normal rate.     Pulses: Normal pulses.  Pulmonary:     Effort: Pulmonary effort is normal.  Skin:    General: Skin is warm.     Capillary Refill: Capillary refill takes less than 2 seconds.     Coloration: Skin is not jaundiced.     Findings: No bruising or lesion.  Neurological:     Mental Status: She is alert and oriented to person, place, and time.  Psychiatric:        Mood and Affect: Mood normal.        Behavior: Behavior normal.        Thought  Content: Thought content normal.    Assessment & Plan:  Malignant neoplasm of upper-outer quadrant of left breast in female, estrogen receptor positive ([place])  Open wound of left breast, initial encounter  Plan for removal of left breast expander with latissimus muscle flap and placement of a new expander.  Risks and complications were reviewed.  [place], DO

## 2021-07-18 ENCOUNTER — Inpatient Hospital Stay (HOSPITAL_COMMUNITY)
Admission: AD | Admit: 2021-07-18 | Discharge: 2021-07-20 | DRG: 941 | Disposition: A | Discharge status: Home / Self Care | Payer: BC Managed Care – PPO | Attending: Plastic Surgery | Admitting: Plastic Surgery

## 2021-07-18 ENCOUNTER — Ambulatory Visit (HOSPITAL_COMMUNITY): Payer: BC Managed Care – PPO | Admitting: Certified Registered Nurse Anesthetist

## 2021-07-18 ENCOUNTER — Encounter (HOSPITAL_COMMUNITY)
Admission: AD | Disposition: A | Discharge status: Home / Self Care | Payer: Self-pay | Source: Home / Self Care | Attending: Plastic Surgery

## 2021-07-18 ENCOUNTER — Encounter (HOSPITAL_COMMUNITY): Payer: Self-pay | Admitting: Plastic Surgery

## 2021-07-18 ENCOUNTER — Other Ambulatory Visit: Payer: Self-pay

## 2021-07-18 DIAGNOSIS — Z79899 Other long term (current) drug therapy: Secondary | ICD-10-CM

## 2021-07-18 DIAGNOSIS — S21002A Unspecified open wound of left breast, initial encounter: Secondary | ICD-10-CM | POA: Diagnosis present

## 2021-07-18 DIAGNOSIS — Z45812 Encounter for adjustment or removal of left breast implant: Secondary | ICD-10-CM | POA: Diagnosis present

## 2021-07-18 DIAGNOSIS — Y838 Other surgical procedures as the cause of abnormal reaction of the patient, or of later complication, without mention of misadventure at the time of the procedure: Secondary | ICD-10-CM | POA: Diagnosis present

## 2021-07-18 DIAGNOSIS — F419 Anxiety disorder, unspecified: Secondary | ICD-10-CM | POA: Diagnosis present

## 2021-07-18 DIAGNOSIS — Z421 Encounter for breast reconstruction following mastectomy: Secondary | ICD-10-CM | POA: Diagnosis not present

## 2021-07-18 DIAGNOSIS — Z885 Allergy status to narcotic agent status: Secondary | ICD-10-CM

## 2021-07-18 DIAGNOSIS — Z45819 Encounter for adjustment or removal of unspecified breast implant: Principal | ICD-10-CM

## 2021-07-18 DIAGNOSIS — C50412 Malignant neoplasm of upper-outer quadrant of left female breast: Secondary | ICD-10-CM | POA: Diagnosis present

## 2021-07-18 DIAGNOSIS — Z9013 Acquired absence of bilateral breasts and nipples: Secondary | ICD-10-CM | POA: Diagnosis not present

## 2021-07-18 DIAGNOSIS — T8131XA Disruption of external operation (surgical) wound, not elsewhere classified, initial encounter: Secondary | ICD-10-CM

## 2021-07-18 DIAGNOSIS — F3181 Bipolar II disorder: Secondary | ICD-10-CM | POA: Diagnosis present

## 2021-07-18 DIAGNOSIS — Z9012 Acquired absence of left breast and nipple: Secondary | ICD-10-CM

## 2021-07-18 DIAGNOSIS — Z170 Estrogen receptor positive status [ER+]: Secondary | ICD-10-CM

## 2021-07-18 DIAGNOSIS — Z87891 Personal history of nicotine dependence: Secondary | ICD-10-CM

## 2021-07-18 HISTORY — PX: TISSUE EXPANDER PLACEMENT: SHX2530

## 2021-07-18 HISTORY — PX: LATISSIMUS FLAP TO BREAST: SHX5357

## 2021-07-18 LAB — CBC
HCT: 40.7 % (ref 36.0–46.0)
Hemoglobin: 13.2 g/dL (ref 12.0–15.0)
MCH: 30.3 pg (ref 26.0–34.0)
MCHC: 32.4 g/dL (ref 30.0–36.0)
MCV: 93.3 fL (ref 80.0–100.0)
Platelets: 371 10*3/uL (ref 150–400)
RBC: 4.36 MIL/uL (ref 3.87–5.11)
RDW: 12.3 % (ref 11.5–15.5)
WBC: 6.7 10*3/uL (ref 4.0–10.5)
nRBC: 0 % (ref 0.0–0.2)

## 2021-07-18 LAB — HIV ANTIBODY (ROUTINE TESTING W REFLEX): HIV Screen 4th Generation wRfx: NONREACTIVE

## 2021-07-18 LAB — BASIC METABOLIC PANEL
Anion gap: 7 (ref 5–15)
BUN: 12 mg/dL (ref 6–20)
CO2: 24 mmol/L (ref 22–32)
Calcium: 9.2 mg/dL (ref 8.9–10.3)
Chloride: 108 mmol/L (ref 98–111)
Creatinine, Ser: 0.8 mg/dL (ref 0.44–1.00)
GFR, Estimated: >60 mL/min (ref >60)
Glucose, Bld: 87 mg/dL (ref 70–99)
Potassium: 3.8 mmol/L (ref 3.5–5.1)
Sodium: 139 mmol/L (ref 135–145)

## 2021-07-18 LAB — POCT PREGNANCY, URINE: Preg Test, Ur: NEGATIVE

## 2021-07-18 SURGERY — INSERTION, TISSUE EXPANDER
Anesthesia: General | Site: Breast | Laterality: Left

## 2021-07-18 MED ORDER — ACETAMINOPHEN 500 MG PO TABS
ORAL_TABLET | ORAL | Status: AC
  Administered 2021-07-18: 1000 mg via ORAL
  Filled 2021-07-18: qty 2

## 2021-07-18 MED ORDER — FENTANYL CITRATE (PF) 100 MCG/2ML IJ SOLN
INTRAMUSCULAR | Status: AC
  Administered 2021-07-18: 50 ug via INTRAVENOUS
  Filled 2021-07-18: qty 2

## 2021-07-18 MED ORDER — DIPHENHYDRAMINE HCL 12.5 MG/5ML PO ELIX
12.5000 mg | ORAL_SOLUTION | Freq: Four times a day (QID) | ORAL | Status: DC | PRN
  Administered 2021-07-19: 12.5 mg via ORAL
  Filled 2021-07-18: qty 10

## 2021-07-18 MED ORDER — FENTANYL CITRATE (PF) 100 MCG/2ML IJ SOLN
25.0000 ug | INTRAMUSCULAR | Status: DC | PRN
  Administered 2021-07-18: 50 ug via INTRAVENOUS

## 2021-07-18 MED ORDER — LIDOCAINE 2% (20 MG/ML) 5 ML SYRINGE
INTRAMUSCULAR | Status: DC | PRN
  Administered 2021-07-18: 60 mg via INTRAVENOUS

## 2021-07-18 MED ORDER — ACETAMINOPHEN 500 MG PO TABS: 1000.0000 mg | ORAL_TABLET | Freq: Once | ORAL | Status: AC

## 2021-07-18 MED ORDER — LACTATED RINGERS IV SOLN: INTRAVENOUS | Status: DC

## 2021-07-18 MED ORDER — DEXAMETHASONE SODIUM PHOSPHATE 10 MG/ML IJ SOLN
INTRAMUSCULAR | Status: DC | PRN
  Administered 2021-07-18: 4 mg via INTRAVENOUS

## 2021-07-18 MED ORDER — KETAMINE HCL 10 MG/ML IJ SOLN
INTRAMUSCULAR | Status: DC | PRN
  Administered 2021-07-18: 30 mg via INTRAVENOUS

## 2021-07-18 MED ORDER — BUPIVACAINE-EPINEPHRINE (PF) 0.25% -1:200000 IJ SOLN
INTRAMUSCULAR | Status: AC
  Filled 2021-07-18: qty 30

## 2021-07-18 MED ORDER — GLYCOPYRROLATE PF 0.2 MG/ML IJ SOSY
PREFILLED_SYRINGE | INTRAMUSCULAR | Status: DC | PRN
  Administered 2021-07-18: .2 mg via INTRAVENOUS

## 2021-07-18 MED ORDER — FENTANYL CITRATE (PF) 250 MCG/5ML IJ SOLN
INTRAMUSCULAR | Status: AC
Start: 2021-07-18 — End: ?
  Filled 2021-07-18: qty 5

## 2021-07-18 MED ORDER — ONDANSETRON HCL 4 MG/2ML IJ SOLN: 4.0000 mg | Freq: Once | INTRAMUSCULAR | Status: DC | PRN

## 2021-07-18 MED ORDER — ROCURONIUM BROMIDE 10 MG/ML (PF) SYRINGE
PREFILLED_SYRINGE | INTRAVENOUS | Status: DC | PRN
  Administered 2021-07-18: 20 mg via INTRAVENOUS
  Administered 2021-07-18 (×2): 30 mg via INTRAVENOUS
  Administered 2021-07-18: 20 mg via INTRAVENOUS
  Administered 2021-07-18: 70 mg via INTRAVENOUS

## 2021-07-18 MED ORDER — BUPIVACAINE-EPINEPHRINE 0.25% -1:200000 IJ SOLN
INTRAMUSCULAR | Status: DC | PRN
  Administered 2021-07-18: 20 mL

## 2021-07-18 MED ORDER — ONDANSETRON HCL 4 MG/2ML IJ SOLN
4.0000 mg | Freq: Four times a day (QID) | INTRAMUSCULAR | Status: DC | PRN
  Administered 2021-07-18: 4 mg via INTRAVENOUS
  Filled 2021-07-18: qty 2

## 2021-07-18 MED ORDER — ACETAMINOPHEN 325 MG PO TABS
325.0000 mg | ORAL_TABLET | Freq: Four times a day (QID) | ORAL | Status: DC
  Administered 2021-07-18 – 2021-07-20 (×7): 325 mg via ORAL
  Filled 2021-07-18 (×7): qty 1

## 2021-07-18 MED ORDER — FENTANYL CITRATE (PF) 250 MCG/5ML IJ SOLN
INTRAMUSCULAR | Status: DC | PRN
  Administered 2021-07-18 (×2): 100 ug via INTRAVENOUS
  Administered 2021-07-18: 50 ug via INTRAVENOUS

## 2021-07-18 MED ORDER — POLYETHYLENE GLYCOL 3350 17 G PO PACK: 17.0000 g | PACK | Freq: Every day | ORAL | Status: DC | PRN

## 2021-07-18 MED ORDER — CHLORHEXIDINE GLUCONATE CLOTH 2 % EX PADS: 6.0000 | MEDICATED_PAD | Freq: Once | CUTANEOUS | Status: DC

## 2021-07-18 MED ORDER — METHOCARBAMOL 500 MG PO TABS
ORAL_TABLET | ORAL | Status: AC
  Administered 2021-07-18: 500 mg via ORAL
  Filled 2021-07-18: qty 1

## 2021-07-18 MED ORDER — ROCURONIUM BROMIDE 10 MG/ML (PF) SYRINGE
PREFILLED_SYRINGE | INTRAVENOUS | Status: AC
  Filled 2021-07-18: qty 10

## 2021-07-18 MED ORDER — OXYCODONE HCL 5 MG PO TABS: 5.0000 mg | ORAL_TABLET | Freq: Once | ORAL | Status: DC | PRN

## 2021-07-18 MED ORDER — CHLORHEXIDINE GLUCONATE 0.12 % MT SOLN: 15.0000 mL | Freq: Once | OROMUCOSAL | Status: AC

## 2021-07-18 MED ORDER — ONDANSETRON HCL 4 MG/2ML IJ SOLN
INTRAMUSCULAR | Status: AC
  Filled 2021-07-18: qty 2

## 2021-07-18 MED ORDER — CHLORHEXIDINE GLUCONATE 0.12 % MT SOLN
OROMUCOSAL | Status: AC
  Administered 2021-07-18: 15 mL via OROMUCOSAL
  Filled 2021-07-18: qty 15

## 2021-07-18 MED ORDER — DIPHENHYDRAMINE HCL 50 MG/ML IJ SOLN: 12.5000 mg | Freq: Four times a day (QID) | INTRAMUSCULAR | Status: DC | PRN

## 2021-07-18 MED ORDER — CEFAZOLIN SODIUM-DEXTROSE 2-4 GM/100ML-% IV SOLN
2.0000 g | INTRAVENOUS | Status: AC
  Administered 2021-07-18: 2 g via INTRAVENOUS
  Filled 2021-07-18: qty 100

## 2021-07-18 MED ORDER — AMISULPRIDE (ANTIEMETIC) 5 MG/2ML IV SOLN: 10.0000 mg | Freq: Once | INTRAVENOUS | Status: DC

## 2021-07-18 MED ORDER — BISACODYL 10 MG RE SUPP: 10.0000 mg | Freq: Every day | RECTAL | Status: DC | PRN

## 2021-07-18 MED ORDER — METHOCARBAMOL 500 MG PO TABS
500.0000 mg | ORAL_TABLET | Freq: Three times a day (TID) | ORAL | Status: DC | PRN
  Administered 2021-07-19 – 2021-07-20 (×4): 500 mg via ORAL
  Filled 2021-07-18 (×4): qty 1

## 2021-07-18 MED ORDER — SENNA 8.6 MG PO TABS
1.0000 | ORAL_TABLET | Freq: Two times a day (BID) | ORAL | Status: DC
  Administered 2021-07-18 – 2021-07-19 (×3): 8.6 mg via ORAL
  Filled 2021-07-18 (×3): qty 1

## 2021-07-18 MED ORDER — HYDROMORPHONE HCL 1 MG/ML IJ SOLN
INTRAMUSCULAR | Status: AC
  Filled 2021-07-18: qty 0.5

## 2021-07-18 MED ORDER — KCL IN DEXTROSE-NACL 20-5-0.45 MEQ/L-%-% IV SOLN
INTRAVENOUS | Status: DC
  Filled 2021-07-18 (×3): qty 1000

## 2021-07-18 MED ORDER — ZOLPIDEM TARTRATE 5 MG PO TABS
5.0000 mg | ORAL_TABLET | Freq: Every evening | ORAL | Status: DC | PRN
  Administered 2021-07-18: 5 mg via ORAL
  Filled 2021-07-18 (×2): qty 1

## 2021-07-18 MED ORDER — GLYCOPYRROLATE PF 0.2 MG/ML IJ SOSY
PREFILLED_SYRINGE | INTRAMUSCULAR | Status: AC
  Filled 2021-07-18: qty 1

## 2021-07-18 MED ORDER — KETAMINE HCL 50 MG/5ML IJ SOSY
PREFILLED_SYRINGE | INTRAMUSCULAR | Status: AC
  Filled 2021-07-18: qty 5

## 2021-07-18 MED ORDER — HYDROCODONE-ACETAMINOPHEN 5-325 MG PO TABS
1.0000 | ORAL_TABLET | ORAL | Status: DC | PRN
  Administered 2021-07-18 – 2021-07-19 (×2): 2 via ORAL
  Filled 2021-07-18 (×2): qty 2

## 2021-07-18 MED ORDER — ORAL CARE MOUTH RINSE: 15.0000 mL | Freq: Once | OROMUCOSAL | Status: AC

## 2021-07-18 MED ORDER — SUGAMMADEX SODIUM 200 MG/2ML IV SOLN
INTRAVENOUS | Status: DC | PRN
Start: 2021-07-18 — End: 2021-07-18
  Administered 2021-07-18: 300 mg via INTRAVENOUS

## 2021-07-18 MED ORDER — VISTASEAL 10 ML SINGLE DOSE KIT
PACK | CUTANEOUS | Status: DC | PRN
  Administered 2021-07-18: 10 mL via TOPICAL

## 2021-07-18 MED ORDER — HYDROMORPHONE HCL 1 MG/ML IJ SOLN
INTRAMUSCULAR | Status: AC
  Administered 2021-07-18: 0.5 mg via INTRAVENOUS
  Filled 2021-07-18: qty 1

## 2021-07-18 MED ORDER — OXYCODONE HCL 5 MG/5ML PO SOLN: 5.0000 mg | Freq: Once | ORAL | Status: DC | PRN

## 2021-07-18 MED ORDER — CEFAZOLIN SODIUM-DEXTROSE 2-4 GM/100ML-% IV SOLN
2.0000 g | Freq: Three times a day (TID) | INTRAVENOUS | Status: DC
  Administered 2021-07-18 – 2021-07-19 (×3): 2 g via INTRAVENOUS
  Filled 2021-07-18 (×3): qty 100

## 2021-07-18 MED ORDER — SODIUM CHLORIDE 0.9 % IV SOLN
INTRAVENOUS | Status: DC | PRN
  Administered 2021-07-18: 80 mg

## 2021-07-18 MED ORDER — MIDAZOLAM HCL 2 MG/2ML IJ SOLN
INTRAMUSCULAR | Status: AC
  Filled 2021-07-18: qty 2

## 2021-07-18 MED ORDER — PROPOFOL 10 MG/ML IV BOLUS
INTRAVENOUS | Status: DC | PRN
  Administered 2021-07-18: 200 mg via INTRAVENOUS

## 2021-07-18 MED ORDER — MIDAZOLAM HCL 2 MG/2ML IJ SOLN
INTRAMUSCULAR | Status: DC | PRN
  Administered 2021-07-18: 2 mg via INTRAVENOUS

## 2021-07-18 MED ORDER — MIDAZOLAM HCL 2 MG/2ML IJ SOLN
INTRAMUSCULAR | Status: AC
  Administered 2021-07-18: 1 mg via INTRAVENOUS
  Filled 2021-07-18: qty 2

## 2021-07-18 MED ORDER — CALCIUM CARBONATE ANTACID 500 MG PO CHEW
1.0000 | CHEWABLE_TABLET | Freq: Three times a day (TID) | ORAL | Status: DC | PRN
  Administered 2021-07-18: 200 mg via ORAL
  Filled 2021-07-18: qty 1

## 2021-07-18 MED ORDER — METHOCARBAMOL 1000 MG/10ML IJ SOLN: 500.0000 mg | Freq: Three times a day (TID) | INTRAVENOUS | Status: DC | PRN

## 2021-07-18 MED ORDER — HYDROMORPHONE HCL 1 MG/ML IJ SOLN: 0.5000 mg | INTRAMUSCULAR | Status: DC | PRN

## 2021-07-18 MED ORDER — HEMOSTATIC AGENTS (NO CHARGE) OPTIME
TOPICAL | Status: DC | PRN
  Administered 2021-07-18 (×2): 1 via TOPICAL

## 2021-07-18 MED ORDER — KETOROLAC TROMETHAMINE 30 MG/ML IJ SOLN: 30.0000 mg | Freq: Once | INTRAMUSCULAR | Status: DC

## 2021-07-18 MED ORDER — DEXAMETHASONE SODIUM PHOSPHATE 10 MG/ML IJ SOLN
INTRAMUSCULAR | Status: AC
  Filled 2021-07-18: qty 1

## 2021-07-18 MED ORDER — HYDROMORPHONE HCL 1 MG/ML IJ SOLN
1.0000 mg | INTRAMUSCULAR | Status: DC | PRN
  Administered 2021-07-18 – 2021-07-20 (×7): 1 mg via INTRAVENOUS
  Filled 2021-07-18 (×8): qty 1

## 2021-07-18 MED ORDER — IBUPROFEN 400 MG PO TABS
400.0000 mg | ORAL_TABLET | Freq: Four times a day (QID) | ORAL | Status: DC
  Administered 2021-07-18 – 2021-07-20 (×7): 400 mg via ORAL
  Filled 2021-07-18 (×7): qty 1

## 2021-07-18 MED ORDER — MIDAZOLAM HCL 2 MG/2ML IJ SOLN: 1.0000 mg | Freq: Once | INTRAMUSCULAR | Status: AC | PRN

## 2021-07-18 MED ORDER — ONDANSETRON 4 MG PO TBDP: 4.0000 mg | ORAL_TABLET | Freq: Four times a day (QID) | ORAL | Status: DC | PRN

## 2021-07-18 MED ORDER — ONDANSETRON HCL 4 MG/2ML IJ SOLN
INTRAMUSCULAR | Status: DC | PRN
  Administered 2021-07-18: 4 mg via INTRAVENOUS

## 2021-07-18 MED ORDER — HYDROMORPHONE HCL 1 MG/ML IJ SOLN
INTRAMUSCULAR | Status: DC | PRN
  Administered 2021-07-18 (×2): .5 mg via INTRAVENOUS

## 2021-07-18 SURGICAL SUPPLY — 71 items
ADH SKN CLS APL DERMABOND .7 (GAUZE/BANDAGES/DRESSINGS) ×2
AGENT HMST 10 BLLW SHRT CANN (HEMOSTASIS) ×2
APPLIER CLIP 9.375 MED OPEN (MISCELLANEOUS) ×2
APR CLP MED 9.3 20 MLT OPN (MISCELLANEOUS) ×1
BAG COUNTER SPONGE SURGICOUNT (BAG) ×3 IMPLANT
BAG DECANTER FOR FLEXI CONT (MISCELLANEOUS) ×3 IMPLANT
BAG SPNG CNTER NS LX DISP (BAG) ×1
BINDER BREAST XLRG (GAUZE/BANDAGES/DRESSINGS) ×1 IMPLANT
BIOPATCH RED 1 DISK 7.0 (GAUZE/BANDAGES/DRESSINGS) ×7 IMPLANT
BLADE SURG 10 STRL SS (BLADE) ×3 IMPLANT
BLADE SURG 15 STRL LF DISP TIS (BLADE) ×2 IMPLANT
BLADE SURG 15 STRL SS (BLADE) ×2
CANISTER SUCT 3000ML PPV (MISCELLANEOUS) ×3 IMPLANT
CLIP APPLIE 9.375 MED OPEN (MISCELLANEOUS) ×2 IMPLANT
CLSR STERI-STRIP ANTIMIC 1/2X4 (GAUZE/BANDAGES/DRESSINGS) ×1 IMPLANT
CONNECTOR 5 IN 1 STRAIGHT STRL (MISCELLANEOUS) ×3 IMPLANT
COVER SURGICAL LIGHT HANDLE (MISCELLANEOUS) ×3 IMPLANT
DERMABOND ADVANCED (GAUZE/BANDAGES/DRESSINGS) ×2
DERMABOND ADVANCED .7 DNX12 (GAUZE/BANDAGES/DRESSINGS) ×4 IMPLANT
DRAIN CHANNEL 19F RND (DRAIN) ×7 IMPLANT
DRAPE HALF SHEET 40X57 (DRAPES) ×6 IMPLANT
DRAPE INCISE 23X17 IOBAN STRL (DRAPES) ×1
DRAPE INCISE 23X17 STRL (DRAPES) ×2 IMPLANT
DRAPE INCISE IOBAN 23X17 STRL (DRAPES) ×1 IMPLANT
DRAPE INCISE IOBAN 85X60 (DRAPES) ×1 IMPLANT
DRAPE ORTHO SPLIT 77X108 STRL (DRAPES) ×4
DRAPE SURG 17X23 STRL (DRAPES) ×12 IMPLANT
DRAPE SURG ORHT 6 SPLT 77X108 (DRAPES) ×4 IMPLANT
DRSG MEPILEX BORDER 4X8 (GAUZE/BANDAGES/DRESSINGS) ×2 IMPLANT
DRSG PAD ABDOMINAL 8X10 ST (GAUZE/BANDAGES/DRESSINGS) ×6 IMPLANT
DRSG TEGADERM 4X4.75 (GAUZE/BANDAGES/DRESSINGS) ×1 IMPLANT
ELECT BLADE 4.0 EZ CLEAN MEGAD (MISCELLANEOUS) ×2
ELECT CAUTERY BLADE 6.4 (BLADE) ×3 IMPLANT
ELECT REM PT RETURN 9FT ADLT (ELECTROSURGICAL) ×2
ELECTRODE BLDE 4.0 EZ CLN MEGD (MISCELLANEOUS) ×2 IMPLANT
ELECTRODE REM PT RTRN 9FT ADLT (ELECTROSURGICAL) ×2 IMPLANT
EVACUATOR SILICONE 100CC (DRAIN) ×7 IMPLANT
GAUZE SPONGE 4X4 12PLY STRL (GAUZE/BANDAGES/DRESSINGS) ×6 IMPLANT
GLOVE BIO SURGEON STRL SZ 6.5 (GLOVE) ×6 IMPLANT
GOWN STRL REUS W/ TWL LRG LVL3 (GOWN DISPOSABLE) ×4 IMPLANT
GOWN STRL REUS W/TWL LRG LVL3 (GOWN DISPOSABLE) ×4
HEMOSTAT HEMOBLAST BELLOWS (HEMOSTASIS) ×2 IMPLANT
IMPL BREAST XPD TISS SUT 350 (Breast) IMPLANT
IMPL BRST XPD TISS SUT 350CC (Breast) ×1 IMPLANT
IMPLANT BREAST 350CC (Breast) ×2 IMPLANT
KIT BASIN OR (CUSTOM PROCEDURE TRAY) ×3 IMPLANT
KIT FILL ASEPTIC TRANSFER (MISCELLANEOUS) ×1 IMPLANT
KIT TURNOVER KIT B (KITS) ×3 IMPLANT
NDL HYPO 25GX1X1/2 BEV (NEEDLE) ×2 IMPLANT
NEEDLE HYPO 25GX1X1/2 BEV (NEEDLE) ×2 IMPLANT
NS IRRIG 1000ML POUR BTL (IV SOLUTION) ×6 IMPLANT
PACK GENERAL/GYN (CUSTOM PROCEDURE TRAY) ×3 IMPLANT
PAD ARMBOARD 7.5X6 YLW CONV (MISCELLANEOUS) ×3 IMPLANT
PENCIL BUTTON HOLSTER BLD 10FT (ELECTRODE) ×3 IMPLANT
PIN SAFETY STERILE (MISCELLANEOUS) ×6 IMPLANT
SET ASEPTIC TRANSFER (MISCELLANEOUS) IMPLANT
STAPLER VISISTAT 35W (STAPLE) ×3 IMPLANT
SUT MNCRL AB 3-0 PS2 18 (SUTURE) ×6 IMPLANT
SUT MON AB 4-0 PS1 27 (SUTURE) ×7 IMPLANT
SUT MON AB 5-0 PS2 18 (SUTURE) ×5 IMPLANT
SUT PDS AB 3-0 SH 27 (SUTURE) ×4 IMPLANT
SUT SILK 3 0 SH CR/8 (SUTURE) ×1 IMPLANT
SUT VIC AB 3-0 SH 27 (SUTURE) ×12
SUT VIC AB 3-0 SH 27X BRD (SUTURE) ×6 IMPLANT
SUT VIC AB 4-0 PS2 18 (SUTURE) ×1 IMPLANT
SYR BULB IRRIG 60ML STRL (SYRINGE) ×3 IMPLANT
SYR CONTROL 10ML LL (SYRINGE) ×3 IMPLANT
TOWEL GREEN STERILE (TOWEL DISPOSABLE) ×3 IMPLANT
TOWEL GREEN STERILE FF (TOWEL DISPOSABLE) ×3 IMPLANT
TUBE CONNECTING 12X1/4 (SUCTIONS) ×3 IMPLANT
YANKAUER SUCT BULB TIP NO VENT (SUCTIONS) ×3 IMPLANT

## 2021-07-18 NOTE — Anesthesia Preprocedure Evaluation (Addendum)
Anesthesia Evaluation  Patient identified by MRN, date of birth, ID band Patient awake    Reviewed: Allergy & Precautions, NPO status , Patient's Chart, lab work & pertinent test results  History of Anesthesia Complications Negative for: history of anesthetic complications  Airway Mallampati: II  TM Distance: >3 FB Neck ROM: Full    Dental  (+) Dental Advisory Given, Teeth Intact   Pulmonary former smoker,    Pulmonary exam normal        Cardiovascular negative cardio ROS Normal cardiovascular exam     Neuro/Psych Seizures -, Well Controlled,  PSYCHIATRIC DISORDERS Anxiety Depression Bipolar Disorder    GI/Hepatic negative GI ROS, Neg liver ROS,   Endo/Other  negative endocrine ROS  Renal/GU negative Renal ROS     Musculoskeletal negative musculoskeletal ROS (+)   Abdominal   Peds  Hematology negative hematology ROS (+)   Anesthesia Other Findings Breast Wound  Reproductive/Obstetrics                            Anesthesia Physical  Anesthesia Plan  ASA: 2  Anesthesia Plan: General   Post-op Pain Management: Tylenol PO (pre-op)* and Toradol IV (intra-op)*   Induction: Intravenous  PONV Risk Score and Plan: 3 and Midazolam, Dexamethasone, Ondansetron, Scopolamine patch - Pre-op and Treatment may vary due to age or medical condition  Airway Management Planned: LMA  Additional Equipment: None  Intra-op Plan:   Post-operative Plan: Extubation in OR  Informed Consent: I have reviewed the patients History and Physical, chart, labs and discussed the procedure including the risks, benefits and alternatives for the proposed anesthesia with the patient or authorized representative who has indicated his/her understanding and acceptance.     Dental advisory given  Plan Discussed with: CRNA and Anesthesiologist  Anesthesia Plan Comments:       Anesthesia Quick Evaluation

## 2021-07-18 NOTE — Interval H&P Note (Signed)
History and Physical Interval Note:  07/18/2021 12:25 PM  Haley Evans  has presented today for surgery, with the diagnosis of Wound on Breast.  The various methods of treatment have been discussed with the patient and family. After consideration of risks, benefits and other options for treatment, the patient has consented to  Procedure(s) with comments: REMOVAL AND REPLACEMENT OF TISSUE EXPANDER (Left) - 3 hours LATISSIMUS FLAP TO BREAST (Left) as a surgical intervention.  The patient's history has been reviewed, patient examined, no change in status, stable for surgery.  I have reviewed the patient's chart and labs.  Questions were answered to the patient's satisfaction.     Loel Lofty Brayton Baumgartner

## 2021-07-18 NOTE — Transfer of Care (Signed)
Immediate Anesthesia Transfer of Care Note  Patient: Haley Evans  Procedure(s) Performed: REMOVAL AND REPLACEMENT OF TISSUE EXPANDER (Left: Breast) LATISSIMUS FLAP TO BREAST (Left: Breast)  Patient Location: PACU  Anesthesia Type:General  Level of Consciousness: awake, drowsy, patient cooperative and responds to stimulation  Airway & Oxygen Therapy: Patient Spontanous Breathing and Patient connected to nasal cannula oxygen  Post-op Assessment: Report given to RN and Post -op Vital signs reviewed and stable  Post vital signs: Reviewed and stable  Last Vitals:  Vitals Value Taken Time  BP    Temp    Pulse 98 07/18/21 1625  Resp 21 07/18/21 1625  SpO2 97 % 07/18/21 1625  Vitals shown include unvalidated device data.  Last Pain:  Vitals:   07/18/21 1133  TempSrc:   PainSc: 5          Complications: No notable events documented.

## 2021-07-18 NOTE — Anesthesia Procedure Notes (Signed)
Procedure Name: Intubation Date/Time: 07/18/2021 1:07 PM Performed by: Cathren Harsh, CRNA Pre-anesthesia Checklist: Patient identified, Emergency Drugs available, Suction available and Patient being monitored Patient Re-evaluated:Patient Re-evaluated prior to induction Oxygen Delivery Method: Circle System Utilized Preoxygenation: Pre-oxygenation with 100% oxygen Induction Type: IV induction Ventilation: Mask ventilation without difficulty Laryngoscope Size: Mac and 3 Grade View: Grade I Tube type: Oral Tube size: 7.0 mm Number of attempts: 1 Airway Equipment and Method: Stylet and Oral airway Placement Confirmation: ETT inserted through vocal cords under direct vision, positive ETCO2 and breath sounds checked- equal and bilateral Secured at: 21 cm Tube secured with: Tape Dental Injury: Teeth and Oropharynx as per pre-operative assessment

## 2021-07-18 NOTE — Anesthesia Postprocedure Evaluation (Signed)
Anesthesia Post Note  Patient: Cytogeneticist  Procedure(s) Performed: REMOVAL AND REPLACEMENT OF TISSUE EXPANDER (Left: Breast) LATISSIMUS FLAP TO BREAST (Left: Breast)     Patient location during evaluation: PACU Anesthesia Type: General Level of consciousness: awake and alert, oriented and patient cooperative Pain management: pain level controlled Vital Signs Assessment: post-procedure vital signs reviewed and stable Respiratory status: spontaneous breathing, nonlabored ventilation and respiratory function stable Cardiovascular status: blood pressure returned to baseline and stable Postop Assessment: no apparent nausea or vomiting Anesthetic complications: no   No notable events documented.  Last Vitals:  Vitals:   07/18/21 1115 07/18/21 1630  BP: 123/81   Pulse: 80 (!) 102  Resp: 18 (!) 22  Temp: 36.8 C 36.6 C  SpO2: 99% 100%    Last Pain:  Vitals:   07/18/21 1630  TempSrc:   PainSc: [place]

## 2021-07-18 NOTE — Op Note (Signed)
DATE OF OPERATION: 07/18/2021  LOCATION: [facility] Room  PREOPERATIVE DIAGNOSIS:  Breast Cancer Status post left mastectomy. 2.   Acquired absence of left Breast.  POSTOPERATIVE DIAGNOSIS: Same  PROCEDURE: 1. Latissimus myocutaneous flap  to reconstruct the left breast  2. Tissue expander placement 350 cc with 50 cc of saline placed  SURGEON: Elzena Muston Sanger Deondra Wigger, DO  ASSISTANT: Donnamarie Rossetti, PA   EBL: 75 cc  SPECIMEN: None  DRAINS: three total 46 blake round drains  CONDITION: Stable  COMPLICATIONS: None  INDICATION: The patient, Haley Evans, is a 41 y.o. female born on 03/15/1980, is here for treatment after a left mastectomy with resulting absence of breast and asymmetry. She had an expander and Flex HD placed.  She developed flap necrosis with a resulting wound.   PROCEDURE DETAILS:  The patient was seen on the morning of her surgery and marked for the location of the flap.  An IV was placed and IV antibiotics were given. The patient was taken to the operating room and placed on the operating room table.  A general anesthetic was administered.  A standard time out was performed and all information was confirmed to be correct by those in the room. Leg compression devices were placed on her legs and .  The patient was placed in the right lateral decubitus position on a beanbag.  All key prominent points were padded and an axillary roll was placed in the dependent axilla. The ipsilateral arm was placed on a padded rest anteriosuperiorly.  She was then prepped and draped in the standard sterile fashion. The paddle design and position were confirmed.   The procedure began by incising the skin at the marked skin paddle.  The left breast pocket was opened at the inframammary fold.  The expander was removed and the pocket irrigated with antibiotic solution. The Flex HD was also removed.  Attention was then turned to the back. The #10 blade and bovie were used to dissect  down to the latissimus muscle.  Anterior and posterior flaps were raised to expose the latissimus muscle edges.  The skin and fat flaps were elevated to the extent necessary for release of the muscle.  The muscle was released at the superior edge of the inferior angle of the scapula.  This aids in identification of the serratus muscle to prevent lifting it with the latissimus muscle.  The larger caliber perforator were clipped and the smaller vessels controlled with electrocautery.  The dissection continued toward the midline and inferior toward the iliac crest.  The subscapular artery to the thoracodorsal artery was palpated in the axilla.  The branch to the serratus is ligated.  Care was taken to protect the vascular pedicle throughout this portion of the procedure. The paddle and muscle looked healthy throughout the case.  The old mastectomy scar was excised and the flaps on top of the pectoralis muscle were raised.  The posterior and anterior pockets were then connected in the plane above the muscle. The muscle from the back and the skin paddle were then rotated into the chest pocket and covered with an ioban dressing. There was a surprising amount of scar tissue at the anterior margin.  The flap and skin pedicle were inspected and there was no tension. The back pocket was hemostased and Evicel placed. Two #19 blake round drains were place in the anterior skin flap and secured with 3-0 Silk. One drain was positioned inferiorly and one superiorly.  The back incision was closed  in layers with buried 3-0 PDS, followed by 3-0 Monocryl and 4-0 Monocryl.  Dermabond and a protective dressing was applied.   The patient was repositioned onto her back and the chest was prepped and draped. The breast pocket was inspected and hemostases was achieved with electrocautery. The muscle was then secured superiorly with 3-0 Vicryl. A 350 cc expander was chosen. It was soaked in triple antibiotic solution and evacuated of air and  then filled with 50 cc of sterile saline. The inferior portion of the muscle was tacked to the inframammary fold with 3-0 Vicryl.  One drain was placed on this side and secured with 3-0 Silk. The flap was then closed with 3-0 Monocryl deep, followed by 4-0 Monocryl and the skin closed with 5-0 Monocryl.  The skin paddle was brought out through the wound and secured with the 4-0 Vicryl and 4-0 Monocryl. Dermabond, ABDs and a breast binder was applied.    The patient was allowed to wake up and taken to recovery room in stable condition at the end of the case. The family was notified at the end of the case.   The advanced practice practitioner (APP) assisted throughout the case.  The APP was essential in retraction and counter traction when needed to make the case progress smoothly.  This retraction and assistance made it possible to see the tissue plans for the procedure.  The assistance was needed for blood control, tissue re-approximation and assisted with closure of the incision site.

## 2021-07-18 NOTE — Discharge Instructions (Signed)
INSTRUCTIONS FOR AFTER BREAST SURGERY   You will likely have some questions about what to expect following your operation.  The following information will help you and your family understand what to expect when you are discharged from the hospital.  Following these guidelines will help ensure a smooth recovery and reduce risks of complications.  Postoperative instructions include information on: diet, wound care, medications and physical activity.  AFTER SURGERY Expect to go home after the procedure.  In some cases, you may need to spend one night in the hospital for observation.  DIET Breast surgery does not require a specific diet.  However, the healthier you eat the better your body can start healing. It is important to increasing your protein intake.  This means limiting the foods with sugar and carbohydrates.  Focus on vegetables and some meat.  If you have any liposuction during your procedure be sure to drink water.  If your urine is bright yellow, then it is concentrated, and you need to drink more water.  As a general rule after surgery, you should have 8 ounces of water every hour while awake.  If you find you are persistently nauseated or unable to take in liquids let us know.  NO TOBACCO USE or EXPOSURE.  This will slow your healing process and increase the risk of a wound.  WOUND CARE Leave the ACE wrap or binder on for 3 days . Use fragrance free soap.   After 3 days you can remove the ACE wrap or binder to shower. Once dry apply ACE wrap, binder or sports bra.  Use a mild soap like Dial, Dove and Ivory. You may have Topifoam or Lipofoam on.  It is soft and spongy and helps keep you from getting creases if you have liposuction.  This can be removed before the shower and then replaced.  If you need more it is available on [employer] (Lipofoam). If you have steri-strips / tape directly attached to your skin leave them in place. It is OK to get these wet.   No baths, pools or hot tubs for four  weeks. We close your incision to leave the smallest and best-looking scar. No ointment or creams on your incisions until given the go ahead.  Especially not Neosporin (Too many skin reactions with this one).  A few weeks after surgery you can use Mederma and start massaging the scar. We ask you to wear your binder or sports bra for the first 6 weeks around the clock, including while sleeping. This provides added comfort and helps reduce the fluid accumulation at the surgery site.  ACTIVITY No heavy lifting until cleared by the doctor.  This usually means no more than a half-gallon of milk.  It is OK to walk and climb stairs. In fact, moving your legs is very important to decrease your risk of a blood clot.  It will also help keep you from getting deconditioned.  Every 1 to 2 hours get up and walk for 5 minutes. This will help with a quicker recovery back to normal.  Let pain be your guide so you don't do too much.  This is not the time for spring cleaning and don't plan on taking care of anyone else.  This time is for you to recover,  You will be more comfortable if you sleep and rest with your head elevated either with a few pillows under you or in a recliner.  No stomach sleeping for a three months.  WORK Everyone   returns to work at different times. As a rough guide, most people take at least 1 - 2 weeks off prior to returning to work. If you need documentation for your job, bring the forms to your postoperative follow up visit.  DRIVING Arrange for someone to bring you home from the hospital.  You may be able to drive a few days after surgery but not while taking any narcotics or valium.  BOWEL MOVEMENTS Constipation can occur after anesthesia and while taking pain medication.  It is important to stay ahead for your comfort.  We recommend taking Milk of Magnesia (2 tablespoons; twice a day) while taking the pain pills.  MEDICATIONS You may be prescribed should start after surgery At your  preoperative visit for you history and physical you may have been given the following medications: An antibiotic: Start this medication when you get home and take according to the instructions on the bottle. Zofran 4 mg:  This is to treat nausea and vomiting.  You can take this every 6 hours as needed and only if needed. Valium 2 mg: This is for muscle tightness if you have an implant or expander. This will help relax your muscle which also helps with pain control.  This can be taken every 12 hours as needed. Don't drive after taking this medication. Norco (hydrocodone/acetaminophen) 5/325 mg:  This is only to be used after you have taken the motrin or the tylenol. Every 8 hours as needed.   Over the counter Medication to take: Ibuprofen (Motrin) 600 mg:  Take this every 6 hours.  If you have additional pain then take 500 mg of the tylenol every 8 hours.  Only take the Norco after you have tried these two. Miralax or stool softener of choice: Take this according to the bottle if you take the Norco.  WHEN TO CALL Call your surgeon's office if any of the following occur: Fever 101 degrees F or greater Excessive bleeding or fluid from the incision site. Pain that increases over time without aid from the medications Redness, warmth, or pus draining from incision sites Persistent nausea or inability to take in liquids Severe misshapen area that underwent the operation. 

## 2021-07-19 ENCOUNTER — Encounter (HOSPITAL_COMMUNITY): Payer: Self-pay | Admitting: Plastic Surgery

## 2021-07-19 ENCOUNTER — Encounter: Payer: Self-pay | Admitting: Plastic Surgery

## 2021-07-19 ENCOUNTER — Encounter: Payer: BC Managed Care – PPO | Admitting: Physical Therapy

## 2021-07-19 ENCOUNTER — Encounter: Payer: Self-pay | Admitting: *Deleted

## 2021-07-19 MED ORDER — CEPHALEXIN 500 MG PO CAPS
500.0000 mg | ORAL_CAPSULE | Freq: Four times a day (QID) | ORAL | Status: DC
  Administered 2021-07-19 – 2021-07-20 (×3): 500 mg via ORAL
  Filled 2021-07-19 (×3): qty 1

## 2021-07-19 MED ORDER — OXYCODONE HCL 5 MG PO TABS
5.0000 mg | ORAL_TABLET | Freq: Four times a day (QID) | ORAL | Status: DC | PRN
  Administered 2021-07-19 – 2021-07-20 (×3): 5 mg via ORAL
  Filled 2021-07-19 (×4): qty 1

## 2021-07-19 MED ORDER — HYDROXYZINE HCL 25 MG PO TABS
50.0000 mg | ORAL_TABLET | Freq: Every day | ORAL | Status: DC
  Administered 2021-07-19: 50 mg via ORAL
  Filled 2021-07-19: qty 2

## 2021-07-19 NOTE — TOC CM/SW Note (Signed)
  Transition of Care (TOC) Screening Note   Patient Details  Name: Haley Evans Date of Birth: May 03, 1980    Transition of Care Department [facility]) has reviewed patient and no TOC needs have been identified at this time. We will continue to monitor patient advancement through interdisciplinary progression rounds. If new patient transition needs arise, please place a TOC consult.

## 2021-07-19 NOTE — Progress Notes (Signed)
1 Day Post-Op  Subjective: Patient is a 41 year old female who presented to [facility] yesterday.  She underwent a latissimus myocutaneous flap to reconstruct the left breast with a 350 cc tissue expander placement with 50 cc of saline placed with Dr. Marla Roe.   Today patient reports she had issues with pain last night.  She states the pain is mainly near her left shoulder/shoulder blade.  Patient reports that the medications help sometimes for a little bit but then the pain comes back.  She also states she feels a little anxious with everything going on. Patient reports that she has eaten a little bit and has been drinking plenty of water.  She reports she has voided.  She states she has not had a bowel movement yet.  Patient states she has been up and walking.  Objective: Vital signs in last 24 hours: Temp:  [97.8 F (36.6 C)-98.6 F (37 C)] 98.6 F (37 C) (05/23 0340) Pulse Rate:  [66-102] 66 (05/23 0340) Resp:  [16-22] 16 (05/23 0340) BP: (120-139)/(68-89) 120/68 (05/23 0340) SpO2:  [97 %-100 %] 100 % (05/23 0340) Weight:  [73.9 kg] 73.9 kg (05/22 1115) Last BM Date : 07/18/21  Intake/Output from previous day: 05/22 0701 - 05/23 0700 In: 1936 [I.V.:1836; IV Piggyback:100] Out: 413 [Drains:363; Blood:50] Intake/Output this shift: No intake/output data recorded.  General appearance: alert, cooperative, and no distress Back: Incision to left lateral back is intact with steri strips. There is no erythema, drainage or swelling noted. The two drains are secured in place with sutures. Both drains have approximately 25 cc of serosanguineous with minimal bloody drainage.   Resp: unlabored breathing, no acute distress Breasts: Skin paddle to left breast is viable with good capillary refill. Incisions to left breast are intact. Inframammary incision to left breast is intact with steri strips. No erythema or drainage is noted. Breast is soft. Drain in place with suture. Drain has  approximately 25 cc of serosanguineous with minimal bloody drainage.   Lab Results:     Latest Ref Rng & Units 07/18/2021   11:43 AM 06/15/2021    1:34 PM 03/09/2021   12:54 PM  CBC  WBC 4.0 - 10.5 K/uL 6.7   9.5   7.4    Hemoglobin 12.0 - 15.0 g/dL 13.2   11.0   13.1    Hematocrit 36.0 - 46.0 % 40.7   34.6   39.8    Platelets 150 - 400 K/uL 371   432   344      BMET Recent Labs    07/18/21 1143  NA 139  K 3.8  CL 108  CO2 24  GLUCOSE 87  BUN 12  CREATININE 0.80  CALCIUM 9.2   PT/INR No results for input(s): LABPROT, INR in the last 72 hours. ABG No results for input(s): PHART, HCO3 in the last 72 hours.  Invalid input(s): PCO2, PO2  Studies/Results: No results found.  Anti-infectives: Anti-infectives (From admission, onward)    Start     Dose/Rate Route Frequency Ordered Stop   07/18/21 2100  ceFAZolin (ANCEF) IVPB 2g/100 mL premix        2 g 200 mL/hr over 30 Minutes Intravenous Every 8 hours 07/18/21 1634 07/25/21 2059   07/18/21 1352  gentamicin (GARAMYCIN) 80 mg in sodium chloride 0.9 % 500 mL irrigation  Status:  Discontinued          As needed 07/18/21 1352 07/18/21 1620   07/18/21 1115  ceFAZolin (ANCEF) IVPB  2g/100 mL premix        2 g 200 mL/hr over 30 Minutes Intravenous On call to O.R. 07/18/21 1112 07/18/21 1304       Assessment/Plan: s/p Procedure(s): REMOVAL AND REPLACEMENT OF TISSUE EXPANDER LATISSIMUS FLAP TO BREAST  -Pain Control: tylenol, ibuprofen, robaxin, dilaudid prn; switched hydrocodone to oxycodone '5mg'$   -Started patient's home hydroxyzine  -Will plan to keep patient another day for pain control and to monitor drain output.  Plan for discharge to home tomorrow   LOS: 1 day   Discussed objective findings and plan with Dr. Staci Acosta, PA-C 07/19/2021

## 2021-07-20 ENCOUNTER — Ambulatory Visit: Payer: BC Managed Care – PPO | Admitting: Surgical

## 2021-07-20 MED ORDER — CEPHALEXIN 500 MG PO CAPS: 500.0000 mg | ORAL_CAPSULE | Freq: Four times a day (QID) | ORAL | 0 refills | Status: AC

## 2021-07-20 NOTE — Plan of Care (Signed)

## 2021-07-20 NOTE — Discharge Summary (Signed)
Physician Discharge Summary  Patient ID: Andreanna Mikolajczak MRN: [id number] DOB/AGE: 41-08-1980 41 y.o.  Admit date: 07/18/2021 Discharge date: 07/20/2021  Admission Diagnoses: Breast cancer s/p left mastectomy, acquired absence of left breast  Discharge Diagnoses:  Principal Problem:   Admission for removal of tissue expander of breast, unspecified laterality Active Problems:   Acquired absence of left breast   Discharged Condition: good  Hospital Course: Patient is a 41 year old female who presented to [facility] on 07/18/21. Patient underwent latissimus myocutaneous flap to reconstruct the left breast and tissue expander placement 350 cc with 50 cc of saline placed with Dr. Marla Roe. Patient was monitored on the floor for pain control and drain monitoring.  There were no complications.  Yesterday patient was having some pain.  Pain medications were adjusted.  This morning, patient reports she is doing better.  She does not report any acute events overnight.  She states pain is better controlled.  Patient reports that she has been up and walking.  She states that she has been eating and drinking without issue.  She reports she has been voiding but has not had a bowel movement yet.  Patient reports she has MiraLAX at home.  Patient states that she is ready to go home today.  She states that she has someone who can pick her up and take her home.  Patient also reports that she has not been taking her tamoxifen for a while.  I discussed with her to continue to hold it for at least 2 weeks postoperatively.  Patient acknowledged.  Discussed with patient that she should leave Steri-Strips and Mepilex border dressing in place.  Patient acknowledged.  Instructed patient to call if she has any questions or concerns.  Consults: None  Significant Diagnostic Studies: N/A  Treatments: antibiotics: Keflex and analgesia: acetaminophen and Ibuprofen, oxycodone  Discharge Exam: Blood  pressure 119/82, pulse 87, temperature 98.3 F (36.8 C), temperature source Oral, resp. rate 16, height '5\' 9"'$  (1.753 m), weight 163 lb (73.9 kg), last menstrual period 07/11/2021, SpO2 99 %. General appearance: alert, cooperative, and no distress Back: The incision to the back is intact with steri strips. There is no erythema or drainage. There is a very small soft fluid collection palpated at the superior aspect of the incision. There is no ecchymosis or drainage from the incision noted. Two drains secured in place. Both have approximately 5cc of serosanguinous drainage in bulb.  Resp: unlabored breathing, no respiratory distress Breasts: Skin paddle to left breast is viable with good capillary refill. Incisions are intact. Inframammary incision intact with steri strips. No erythema or drainage noted. Breast is soft. No bruising noted. Drain secured in place. Approximately 5 cc of serosanguineous drainage in bulb.  Extremities: No swelling or tenderness  Disposition: Discharge disposition: 01-Home or Self Care     I discussed with patient that she should her tamoxifen for 2 weeks post operatively. She states she has not taken it for a while. She acknowledged to continue to hold it for 2 weeks postoperatively.   Discussed with patient that she should leave Steri-Strips and Mepilex border dressing in place.  Patient acknowledged.  Instructed patient to call if she has any questions or concerns.  Objective findings and plan were discussed with Dr. Marla Roe  Discharge Instructions     ([place]) Call MD:  Anytime you have any of the following symptoms: 1) 3 pound weight gain in 24 hours or 5 pounds in 1 week 2)  shortness of breath, with or without a dry hacking cough 3) swelling in the hands, feet or stomach 4) if you have to sleep on extra pillows at night in order to breathe.   Complete by: As directed    Call MD for:  difficulty breathing, headache or visual disturbances    Complete by: As directed    Call MD for:  extreme fatigue   Complete by: As directed    Call MD for:  hives   Complete by: As directed    Call MD for:  persistant dizziness or light-headedness   Complete by: As directed    Call MD for:  persistant nausea and vomiting   Complete by: As directed    Call MD for:  redness, tenderness, or signs of infection (pain, swelling, redness, odor or green/yellow discharge around incision site)   Complete by: As directed    Call MD for:  severe uncontrolled pain   Complete by: As directed    Call MD for:  temperature >100.4   Complete by: As directed    Diet - low sodium heart healthy   Complete by: As directed    Increase activity slowly   Complete by: As directed       Allergies as of 07/20/2021       Reactions   Morphine Itching        Medication List     STOP taking these medications    traMADol 50 MG tablet Commonly known as: ULTRAM       TAKE these medications    acetaminophen 500 MG tablet Commonly known as: TYLENOL Take 1,000 mg by mouth every 6 (six) hours as needed for moderate pain. Notes to patient: Do not exceed over 3000-4000 mg in a 24 hour period.   ALPRAZolam 0.5 MG tablet Commonly known as: XANAX TAKE 1 TABLET BY MOUTH TWICE DAILY AS NEEDED FOR ANXIETY   aspirin EC 81 MG tablet Take 81 mg by mouth daily. Swallow whole.   BIOTIN PO Take by mouth daily at 2 am.   cephALEXin 500 MG capsule Commonly known as: KEFLEX Take 1 capsule (500 mg total) by mouth 4 (four) times daily for 3 days.   diazepam 2 MG tablet Commonly known as: Valium Take 1 tablet (2 mg total) by mouth every 12 (twelve) hours as needed for muscle spasms.   FISH OIL PO Take by mouth daily at 2 am.   gabapentin 100 MG capsule Commonly known as: NEURONTIN Take 100 mg by mouth 3 (three) times daily as needed for pain.   hydrOXYzine 50 MG tablet Commonly known as: ATARAX Take 50 mg by mouth at bedtime.   methocarbamol 500 MG  tablet Commonly known as: ROBAXIN Take 1,000 mg by mouth 2 (two) times daily.   multivitamin with minerals Tabs tablet Take 1 tablet by mouth daily.   ondansetron 4 MG tablet Commonly known as: Zofran Take 1 tablet (4 mg total) by mouth every 8 (eight) hours as needed for nausea or vomiting.   oxyCODONE 5 MG immediate release tablet Commonly known as: Oxy IR/ROXICODONE Take 1 tablet (5 mg total) by mouth every 6 (six) hours as needed for up to 5 days for severe pain.   SUPER B COMPLEX PO Take 1 tablet by mouth daily.   Super C-500 Complex Tbcr Take 1 tablet by mouth daily.   tamoxifen 10 MG tablet Commonly known as: NOLVADEX Take 1 tablet (10 mg total) by mouth daily.   TURMERIC PO  Take 1 capsule by mouth daily.   varenicline 1 MG tablet Commonly known as: CHANTIX Take 1 mg by mouth daily.   VITAMIN E PO Take 1 capsule by mouth daily.   YUMVS BEET ROOT-TART CHERRY PO Take by mouth daily. Beet root supplement        Follow-up Information     Wallace Going, DO Follow up in 10 day(s).   Specialty: Plastic Surgery Contact information: [address] [place] [address] [phone]                 [facility] [address] [place],  [address] [phone]  Signed: Clance Boll 07/20/2021, 9:32 AM

## 2021-07-22 ENCOUNTER — Encounter: Payer: Self-pay | Admitting: Plastic Surgery

## 2021-07-22 ENCOUNTER — Other Ambulatory Visit: Payer: Self-pay

## 2021-07-22 ENCOUNTER — Encounter: Payer: Self-pay | Admitting: General Practice

## 2021-07-22 ENCOUNTER — Encounter (HOSPITAL_BASED_OUTPATIENT_CLINIC_OR_DEPARTMENT_OTHER): Payer: Self-pay | Admitting: Emergency Medicine

## 2021-07-22 ENCOUNTER — Emergency Department (HOSPITAL_BASED_OUTPATIENT_CLINIC_OR_DEPARTMENT_OTHER)
Admission: EM | Admit: 2021-07-22 | Discharge: 2021-07-23 | Disposition: A | Discharge status: Home / Self Care | Payer: BC Managed Care – PPO | Attending: Emergency Medicine | Admitting: Emergency Medicine

## 2021-07-22 DIAGNOSIS — Z4801 Encounter for change or removal of surgical wound dressing: Secondary | ICD-10-CM | POA: Diagnosis present

## 2021-07-22 DIAGNOSIS — Z853 Personal history of malignant neoplasm of breast: Secondary | ICD-10-CM | POA: Diagnosis not present

## 2021-07-22 DIAGNOSIS — Z7982 Long term (current) use of aspirin: Secondary | ICD-10-CM | POA: Insufficient documentation

## 2021-07-22 DIAGNOSIS — G8918 Other acute postprocedural pain: Secondary | ICD-10-CM

## 2021-07-22 DIAGNOSIS — Z87891 Personal history of nicotine dependence: Secondary | ICD-10-CM | POA: Diagnosis not present

## 2021-07-22 MED ORDER — KETOROLAC TROMETHAMINE 10 MG PO TABS: 10.0000 mg | ORAL_TABLET | Freq: Four times a day (QID) | ORAL | 0 refills | Status: DC | PRN

## 2021-07-22 NOTE — Telephone Encounter (Signed)
Call received from [facility] regarding post-op concerns. Information forwarded to Lyndee Leo, PA-c who states she will f/u with pt.

## 2021-07-22 NOTE — Progress Notes (Signed)
[place] Note  Pierre reached out to Spiritual Care by phone, remembering previous conversation after [facility] in February. She is working hard to build in layers of support for herself after feeling distressed and isolated through her series of surgeries this spring.   Provided empathic listening, normalization of feelings, and affirmation of strengths. Encouraged [place] and [employer], which interests [place], for building community and having enjoyable mental distractions with takeaway tools. Placed [employer] peer mentor referral per her permission. Suggested [email or web address] Find a Optician, dispensing to empower her to select a counselor who feels like a good match for her.  We plan to follow up by phone in ca 2 weeks for pastoral check-in.   [place], [place], [facility] Pager [phone] Voicemail [phone]

## 2021-07-22 NOTE — ED Provider Notes (Incomplete)
[facility] Provider Note: Georgena Spurling, MD, FACEP  CSN: [id number] MRN: [id number] ARRIVAL: 07/22/21 at [place]: [id number]/MH04   CHIEF COMPLAINT  Wound Check   HISTORY OF PRESENT ILLNESS  07/22/21 11:52 PM Haley Evans is a 41 y.o. female who is status post nipple sparing mastectomy on the left on 05/12/2021.  She underwent a removal and replacement of the left breast tissue expander and latissimus flap by Dr. Elisabeth Cara on 07/18/2021.  She is here with pain and swelling at the wound site.  She rates associated pain as a 9 out of 10, worse with palpation or movement.   Past Medical History:  Diagnosis Date  . Anemia   . Anxiety   . Bipolar 2 disorder ([place])   . Cancer ([place])    breast  . Depression   . Family history of adverse reaction to anesthesia    aunt woke up during hip replacement  . Seizure ([place])    vs syncopy, age 32 and 12/2017, Seen by neurologist, a definitive diagnosis was not made. Pt reports having seizure aroudn 2021    Past Surgical History:  Procedure Laterality Date  . ARM WOUND REPAIR / CLOSURE Right    fractured arm  . BREAST RECONSTRUCTION WITH PLACEMENT OF TISSUE EXPANDER AND FLEX HD (ACELLULAR HYDRATED DERMIS) Left 06/15/2021   Procedure: REPLACEMENT OF TISSUE EXPANDER AND FLEX HD (ACELLULAR HYDRATED DERMIS);  Surgeon: Wallace Going, DO;  Location: [place];  Service: Plastics;  Laterality: Left;  . CESAREAN SECTION    . DEBRIDEMENT AND CLOSURE WOUND Left 06/15/2021   Procedure: DEBRIDEMENT AND CLOSURE LEFT BREAST WOUND;  Surgeon: Wallace Going, DO;  Location: [place];  Service: Plastics;  Laterality: Left;  1 hour  . LATISSIMUS FLAP TO BREAST Left 07/18/2021   Procedure: LATISSIMUS FLAP TO BREAST;  Surgeon: Wallace Going, DO;  Location: [place];  Service: Plastics;  Laterality: Left;  . NIPPLE SPARING MASTECTOMY Right 05/12/2021   Procedure: RIGHT NIPPLE SPARING MASTECTOMY;  Surgeon: Jovita Kussmaul, MD;  Location: [place];  Service: General;  Laterality: Right;  . NIPPLE SPARING MASTECTOMY WITH SENTINEL LYMPH NODE BIOPSY Left 05/12/2021   Procedure: LEFT NIPPLE SPARING MASTECTOMY WITH LEFT SENTINEL LYMPH NODE BIOPSY;  Surgeon: Jovita Kussmaul, MD;  Location: [facility];  Service: General;  Laterality: Left;  . PRE-PECTORAL BREAST RECON W/ PLACEMENT OF TISSUE EXPANDERS, DERMAL GRAFT Bilateral 05/12/2021   Procedure: IMMEDIATE BILATERAL BREAST RECONSTRUCTION WITH PLACEMENT OF TISSUE EXPANDERS AND FLEX HD;  Surgeon: Wallace Going, DO;  Location: [place];  Service: Plastics;  Laterality: Bilateral;  . TISSUE EXPANDER PLACEMENT Left 07/18/2021   Procedure: REMOVAL AND REPLACEMENT OF TISSUE EXPANDER;  Surgeon: Wallace Going, DO;  Location: [facility];  Service: Plastics;  Laterality: Left;  3 hours    Family History  Problem Relation Age of Onset  . Bladder Cancer Maternal Grandmother 83  . Pancreatic cancer Maternal Grandfather 73    Social History   Tobacco Use  . Smoking status: Former    Types: Cigarettes    Quit date: 04/12/2021    Years since quitting: 0.2  . Smokeless tobacco: Never  Vaping Use  . Vaping Use: Never used  Substance Use Topics  . Alcohol use: Yes    Alcohol/week: 5.0 standard drinks    Types: 1 Glasses of wine, 4 Cans of beer per week    Comment: weekly  . Drug use: No  Prior to Admission medications   Medication Sig Start Date End Date Taking? Authorizing Provider  acetaminophen (TYLENOL) 500 MG tablet Take 1,000 mg by mouth every 6 (six) hours as needed for moderate pain.    [provider]  ALPRAZolam Duanne Moron) 0.5 MG tablet TAKE 1 TABLET BY MOUTH TWICE DAILY AS NEEDED FOR ANXIETY 03/02/12   Argentina Donovan, PA-C  aspirin EC 81 MG tablet Take 81 mg by mouth daily. Swallow whole.    [provider]  B Complex-C (SUPER B COMPLEX PO) Take 1 tablet by mouth daily.    [provider]  Bioflavonoid  Products (SUPER C-500 COMPLEX) TBCR Take 1 tablet by mouth daily.    [provider]  BIOTIN PO Take by mouth daily at 2 am.    [provider]  cephALEXin (KEFLEX) 500 MG capsule Take 1 capsule (500 mg total) by mouth 4 (four) times daily for 3 days. 07/20/21 07/23/21  Dillingham, Loel Lofty, DO  diazepam (VALIUM) 2 MG tablet Take 1 tablet (2 mg total) by mouth every 12 (twelve) hours as needed for muscle spasms. Patient not taking: Reported on 07/15/2021 06/02/21   Scheeler, Carola Rhine, PA-C  gabapentin (NEURONTIN) 100 MG capsule Take 100 mg by mouth 3 (three) times daily as needed for pain. 06/17/21   [provider]  hydrOXYzine (ATARAX) 50 MG tablet Take 50 mg by mouth at bedtime. 05/29/21   [provider]  ketorolac (TORADOL) 10 MG tablet Take 1 tablet (10 mg total) by mouth every 6 (six) hours as needed. 07/22/21   Dillingham, Loel Lofty, DO  methocarbamol (ROBAXIN) 500 MG tablet Take 1,000 mg by mouth 2 (two) times daily. 06/14/21   [provider]  Misc Natural Products (YUMVS BEET ROOT-TART CHERRY PO) Take by mouth daily. Beet root supplement    [provider]  Multiple Vitamin (MULTIVITAMIN WITH MINERALS) TABS tablet Take 1 tablet by mouth daily.    [provider]  Omega-3 Fatty Acids (FISH OIL PO) Take by mouth daily at 2 am.    [provider]  ondansetron (ZOFRAN) 4 MG tablet Take 1 tablet (4 mg total) by mouth every 8 (eight) hours as needed for nausea or vomiting. Patient not taking: Reported on 07/14/2021 04/22/21   Scheeler, Carola Rhine, PA-C  tamoxifen (NOLVADEX) 10 MG tablet Take 1 tablet (10 mg total) by mouth daily. Patient not taking: Reported on 07/15/2021 03/30/21   Nicholas Lose, MD  TURMERIC PO Take 1 capsule by mouth daily.    [provider]  varenicline (CHANTIX) 1 MG tablet Take 1 mg by mouth daily.    [provider]  VITAMIN E PO Take 1 capsule by mouth daily.    [provider]     Allergies Morphine   REVIEW OF SYSTEMS  Negative except as noted here or in the History of Present Illness.   PHYSICAL EXAMINATION  Initial Vital Signs Blood pressure (!) 141/100, pulse 98, temperature 98.7 F (37.1 C), temperature source Oral, resp. rate 20, height '5\' 9"'$  (1.753 m), weight 73.9 kg, last menstrual period 07/11/2021, SpO2 99 %.  Examination General: Well-developed, well-nourished female in no acute distress; appearance consistent with age of record HENT: normocephalic; atraumatic Eyes: pupils equal, round and reactive to light; extraocular muscles intact Neck: supple Heart: regular rate and rhythm; no murmurs, rubs or gallops Lungs: clear to auscultation bilaterally Abdomen: soft; nondistended; nontender; no masses or hepatosplenomegaly; bowel sounds present Extremities: No deformity; full range of motion;  pulses normal Neurologic: Awake, alert and oriented; motor function intact in all extremities and symmetric; no facial droop Skin: Warm and dry Psychiatric: Normal mood and affect   RESULTS  Summary of this visit's results, reviewed and interpreted by myself:   EKG Interpretation  Date/Time:    Ventricular Rate:    PR Interval:    QRS Duration:   QT Interval:    QTC Calculation:   R Axis:     Text Interpretation:         Laboratory Studies: No results found for this or any previous visit (from the past 24 hour(s)). Imaging Studies: No results found.  ED COURSE and MDM  Nursing notes, initial and subsequent vitals signs, including pulse oximetry, reviewed and interpreted by myself.  Vitals:   07/22/21 2337 07/22/21 2340  BP:  (!) 141/100  Pulse:  98  Resp:  20  Temp:  98.7 F (37.1 C)  TempSrc:  Oral  SpO2:  99%  Weight: 73.9 kg   Height: '5\' 9"'$  (1.753 m)    Medications - No data to display    PROCEDURES  Procedures   ED DIAGNOSES  No diagnosis found.

## 2021-07-22 NOTE — ED Triage Notes (Signed)
Post op, Monday (repair from a mastectomy). Pain and possible swelling from wound.

## 2021-07-22 NOTE — Telephone Encounter (Signed)
Patient having pain and it does not seem to be helped with current meds.  Recommend the following: Motrin 400 mg every 6 hrs Tylenol 325 mg every 4 -6 hrs Robaxin 500 mg every 6 hrs. Neurontin 100 mg every 8 hrs Oxy 5 mg every 4 hrs  Will call in Toradol

## 2021-07-22 NOTE — ED Provider Notes (Signed)
[facility] Provider Note: Georgena Spurling, MD, FACEP  CSN: [id number] MRN: [id number] ARRIVAL: 07/22/21 at [place]: [id number]/MH04   CHIEF COMPLAINT  Wound Check   HISTORY OF PRESENT ILLNESS  07/22/21 11:52 PM Haley Evans is a 41 y.o. female who is status post bilateral nipple sparing mastectomies on 05/12/2021 after cancer was found in her left breast.  She has had some postop complications on the left and she underwent a removal and replacement of the left breast tissue expander and latissimus flap by Dr. Elisabeth Cara on 07/18/2021.  She is here with pain and swelling at the back wound site radiating to the left side.  This pain has worsened over the past 2 days.  She is not having significant pain in the left breast itself.  She rates associated pain as a 9 out of 10, worse with palpation or movement.  She has not had a fever or chills.  She has 3 Jackson-Pratt drains which continue to drain serosanguineous fluid.   Past Medical History:  Diagnosis Date   Anemia    Anxiety    Bipolar 2 disorder ([place])    Cancer ([facility])    breast   Depression    Family history of adverse reaction to anesthesia    aunt woke up during hip replacement   Seizure ([place])    vs syncopy, age 66 and 12/2017, Seen by neurologist, a definitive diagnosis was not made. Pt reports having seizure aroudn 2021    Past Surgical History:  Procedure Laterality Date   ARM WOUND REPAIR / CLOSURE Right    fractured arm   BREAST RECONSTRUCTION WITH PLACEMENT OF TISSUE EXPANDER AND FLEX HD (ACELLULAR HYDRATED DERMIS) Left 06/15/2021   Procedure: REPLACEMENT OF TISSUE EXPANDER AND FLEX HD (ACELLULAR HYDRATED DERMIS);  Surgeon: Wallace Going, DO;  Location: [facility];  Service: Plastics;  Laterality: Left;   CESAREAN SECTION     DEBRIDEMENT AND CLOSURE WOUND Left 06/15/2021   Procedure: DEBRIDEMENT AND CLOSURE LEFT BREAST WOUND;  Surgeon: Wallace Going, DO;  Location: [facility];  Service: Plastics;   Laterality: Left;  1 hour   LATISSIMUS FLAP TO BREAST Left 07/18/2021   Procedure: LATISSIMUS FLAP TO BREAST;  Surgeon: Wallace Going, DO;  Location: [facility];  Service: Plastics;  Laterality: Left;   NIPPLE SPARING MASTECTOMY Right 05/12/2021   Procedure: RIGHT NIPPLE SPARING MASTECTOMY;  Surgeon: Jovita Kussmaul, MD;  Location: [facility];  Service: General;  Laterality: Right;   NIPPLE SPARING MASTECTOMY WITH SENTINEL LYMPH NODE BIOPSY Left 05/12/2021   Procedure: LEFT NIPPLE SPARING MASTECTOMY WITH LEFT SENTINEL LYMPH NODE BIOPSY;  Surgeon: Jovita Kussmaul, MD;  Location: [place] [facility];  Service: General;  Laterality: Left;   PRE-PECTORAL BREAST RECON W/ PLACEMENT OF TISSUE EXPANDERS, DERMAL GRAFT Bilateral 05/12/2021   Procedure: IMMEDIATE BILATERAL BREAST RECONSTRUCTION WITH PLACEMENT OF TISSUE EXPANDERS AND FLEX HD;  Surgeon: Wallace Going, DO;  Location: [facility];  Service: Plastics;  Laterality: Bilateral;   TISSUE EXPANDER PLACEMENT Left 07/18/2021   Procedure: REMOVAL AND REPLACEMENT OF TISSUE EXPANDER;  Surgeon: Wallace Going, DO;  Location: [place];  Service: Plastics;  Laterality: Left;  3 hours    Family History  Problem Relation Age of Onset   Bladder Cancer Maternal Grandmother 94   Pancreatic cancer Maternal Grandfather 63    Social History   Tobacco Use   Smoking status: Former    Types: Cigarettes  Quit date: 04/12/2021    Years since quitting: 0.2   Smokeless tobacco: Never  Vaping Use   Vaping Use: Never used  Substance Use Topics   Alcohol use: Yes    Alcohol/week: 5.0 standard drinks    Types: 1 Glasses of wine, 4 Cans of beer per week    Comment: weekly   Drug use: No    Prior to Admission medications   Medication Sig Start Date End Date Taking? Authorizing Provider  acetaminophen (TYLENOL) 500 MG tablet Take 1,000 mg by mouth every 6 (six) hours as needed for moderate pain.    [provider]  ALPRAZolam Duanne Moron) 0.5 MG tablet TAKE 1 TABLET BY MOUTH TWICE DAILY AS NEEDED FOR ANXIETY 03/02/12   Argentina Donovan, PA-C  aspirin EC 81 MG tablet Take 81 mg by mouth daily. Swallow whole.    [provider]  B Complex-C (SUPER B COMPLEX PO) Take 1 tablet by mouth daily.    [provider]  Bioflavonoid Products (SUPER C-500 COMPLEX) TBCR Take 1 tablet by mouth daily.    [provider]  BIOTIN PO Take by mouth daily at 2 am.    [provider]  cephALEXin (KEFLEX) 500 MG capsule Take 1 capsule (500 mg total) by mouth 4 (four) times daily for 3 days. 07/20/21 07/23/21  Dillingham, Loel Lofty, DO  gabapentin (NEURONTIN) 100 MG capsule Take 100 mg by mouth 3 (three) times daily as needed for pain. 06/17/21   [provider]  hydrOXYzine (ATARAX) 50 MG tablet Take 50 mg by mouth at bedtime. 05/29/21   [provider]  ketorolac (TORADOL) 10 MG tablet Take 1 tablet (10 mg total) by mouth every 6 (six) hours as needed. 07/22/21   Dillingham, Loel Lofty, DO  methocarbamol (ROBAXIN) 500 MG tablet Take 1,000 mg by mouth 2 (two) times daily. 06/14/21   [provider]  Misc Natural Products (YUMVS BEET ROOT-TART CHERRY PO) Take by mouth daily. Beet root supplement    [provider]  Multiple Vitamin (MULTIVITAMIN WITH MINERALS) TABS tablet Take 1 tablet by mouth daily.    [provider]  Omega-3 Fatty Acids (FISH OIL PO) Take by mouth daily at 2 am.    [provider]  TURMERIC PO Take 1 capsule by mouth daily.    [provider]  varenicline (CHANTIX) 1 MG tablet Take 1 mg by mouth daily.    [provider]  VITAMIN E PO Take 1 capsule by mouth daily.    [provider]    Allergies Morphine   REVIEW OF SYSTEMS  Negative except as noted here or in the History of Present Illness.   PHYSICAL EXAMINATION  Initial Vital Signs Blood pressure (!) 141/100, pulse 98, temperature 98.7 F (37.1  C), temperature source Oral, resp. rate 20, height '5\' 9"'$  (1.753 m), weight 73.9 kg, last menstrual period 07/11/2021, SpO2 99 %.  Examination General: Well-developed, well-nourished female in no acute distress; appearance consistent with age of record HENT: normocephalic; atraumatic Eyes: Normal appearance Neck: supple Heart: regular rate and rhythm Lungs: clear to auscultation bilaterally Chest/back: Bandaged surgical wounds with 3 Jackson-Pratt drains draining serosanguineous fluid; on removal of bandages the back wound and breast wound (including coin sized skin graft) appear to be healing well without erythema, warmth or drainage; the back wound has some surrounding edema and tenderness Abdomen: soft; nondistended; nontender; bowel sounds present Extremities: No deformity; full range of motion Neurologic: Awake, alert and oriented;  motor function intact in all extremities and symmetric; no facial droop Skin: Warm and dry Psychiatric: Tearful   RESULTS  Summary of this visit's results, reviewed and interpreted by myself:   EKG Interpretation  Date/Time:    Ventricular Rate:    PR Interval:    QRS Duration:   QT Interval:    QTC Calculation:   R Axis:     Text Interpretation:         Laboratory Studies: No results found for this or any previous visit (from the past 24 hour(s)). Imaging Studies: No results found.  ED COURSE and MDM  Nursing notes, initial and subsequent vitals signs, including pulse oximetry, reviewed and interpreted by myself.  Vitals:   07/22/21 2337 07/22/21 2340  BP:  (!) 141/100  Pulse:  98  Resp:  20  Temp:  98.7 F (37.1 C)  TempSrc:  Oral  SpO2:  99%  Weight: 73.9 kg   Height: '5\' 9"'$  (1.753 m)    Medications  ondansetron (ZOFRAN-ODT) disintegrating tablet 8 mg (has no administration in time range)  HYDROmorphone (DILAUDID) injection 2 mg (has no administration in time range)   12:26 AM Discussed with Dr. Bennett Scrape of plastic surgery.   Based on my descriptions he believes the wounds are healing as expected without signs of infection.  His concern is that the patient is not taking oxycodone as frequently as she should/could.  On discussion with the patient she admits that she is terrified of becoming a narcotic addict and has been deliberately avoiding oxycodone, relying primarily on Tylenol and ibuprofen.  The patient was reassured that she is not demonstrating to me any signs of becoming an addict.  She is truly in pain and demonstrates a fear of narcotics and addiction and I believe these are healthy attitudes but may be preventing her from getting adequate pain relief.  We will provide her with a dose of Dilaudid in the ED and encouraged her to take her oxycodone as prescribed.  PROCEDURES  Procedures   ED DIAGNOSES     ICD-10-CM   1. Postoperative pain  G89.18          Sia Gabrielsen, Jenny Reichmann, MD 07/23/21 [phone]

## 2021-07-23 ENCOUNTER — Encounter: Payer: Self-pay | Admitting: Plastic Surgery

## 2021-07-23 MED ORDER — HYDROMORPHONE HCL 1 MG/ML IJ SOLN
2.0000 mg | Freq: Once | INTRAMUSCULAR | Status: DC
  Filled 2021-07-23: qty 2

## 2021-07-23 MED ORDER — ONDANSETRON 4 MG PO TBDP
8.0000 mg | ORAL_TABLET | Freq: Once | ORAL | Status: DC
  Filled 2021-07-23: qty 2

## 2021-07-23 NOTE — ED Notes (Addendum)
Pt reports she is unable to reach her ride home and will have to drive herself home. Dilaudid not given. Dr. Florina Ou informed. Pt verbalized understanding of d/c instructions and follow up care.

## 2021-07-23 NOTE — ED Notes (Signed)
Pt A&Ox4 ambulatory at d/c with independent steady gait.

## 2021-07-24 ENCOUNTER — Encounter: Payer: Self-pay | Admitting: Plastic Surgery

## 2021-07-24 MED ORDER — OXYCODONE HCL 5 MG PO TABS: 5.0000 mg | ORAL_TABLET | Freq: Four times a day (QID) | ORAL | 0 refills | Status: DC | PRN

## 2021-07-24 MED ORDER — OXYCODONE HCL 5 MG PO CAPS: 5.0000 mg | ORAL_CAPSULE | Freq: Four times a day (QID) | ORAL | 0 refills | Status: DC | PRN

## 2021-07-24 MED ORDER — OXYCODONE HCL ER 10 MG PO T12A: 10.0000 mg | EXTENDED_RELEASE_TABLET | Freq: Two times a day (BID) | ORAL | 0 refills | Status: DC

## 2021-07-24 NOTE — Addendum Note (Signed)
Addended by: Wallace Going on: 07/24/2021 06:46 PM   Modules accepted: Orders

## 2021-07-24 NOTE — Addendum Note (Signed)
Addended by: Wallace Going on: 07/24/2021 06:37 PM   Modules accepted: Orders

## 2021-07-26 ENCOUNTER — Telehealth: Payer: Self-pay | Admitting: *Deleted

## 2021-07-26 ENCOUNTER — Telehealth: Payer: Self-pay | Admitting: Adult Health

## 2021-07-26 NOTE — Telephone Encounter (Signed)
[facility] faxed request 07/14/2021 requesting pathology report verifying the initial date of diagnosis by 08/08/2021.  Obtained signed ROI 07/22/2021.  Surgical Pathology [id number] sent to [facility].

## 2021-07-26 NOTE — Telephone Encounter (Signed)
Rescheduled appointment per Haley Evans. Patient is aware of the changes made to her upcoming appointment.

## 2021-07-27 ENCOUNTER — Ambulatory Visit (INDEPENDENT_AMBULATORY_CARE_PROVIDER_SITE_OTHER): Payer: BC Managed Care – PPO | Admitting: Plastic Surgery

## 2021-07-27 ENCOUNTER — Encounter: Payer: Self-pay | Admitting: Plastic Surgery

## 2021-07-27 DIAGNOSIS — Z9012 Acquired absence of left breast and nipple: Secondary | ICD-10-CM

## 2021-07-27 DIAGNOSIS — C50412 Malignant neoplasm of upper-outer quadrant of left female breast: Secondary | ICD-10-CM

## 2021-07-27 DIAGNOSIS — Z170 Estrogen receptor positive status [ER+]: Secondary | ICD-10-CM

## 2021-07-27 NOTE — Progress Notes (Signed)
   Subjective:    Patient ID: Haley Evans, female    DOB: 08/28/1980, 41 y.o.   MRN: [id number]  The patient is a 41 year old female here for follow-up after undergoing a left breast reconstruction with a latissimus muscle flap.  She had a lot of pain over the weekend.  She is doing much better today.  All her incisions look really good.  She has no seroma or fluid buildup in either her back or her front.  Her drain output is minimal but not ready for removal of the drains yet.     Review of Systems  Constitutional:  Positive for activity change and appetite change.  Eyes: Negative.   Respiratory: Negative.    Cardiovascular: Negative.   Gastrointestinal: Negative.       Objective:   Physical Exam Constitutional:      Appearance: Normal appearance.  Cardiovascular:     Rate and Rhythm: Normal rate.     Pulses: Normal pulses.  Skin:    Capillary Refill: Capillary refill takes less than 2 seconds.  Neurological:     Mental Status: She is alert and oriented to person, place, and time.  Psychiatric:        Mood and Affect: Mood normal.        Behavior: Behavior normal.        Thought Content: Thought content normal.        Judgment: Judgment normal.       Assessment & Plan:     ICD-10-CM   1. Malignant neoplasm of upper-outer quadrant of left breast in female, estrogen receptor positive ([place])  C50.412    Z17.0     2. Acquired absence of left breast  Z90.12       We placed injectable saline in the Expander using a sterile technique: Right: 50 cc for a total of  / 455 cc Left: 50 cc for a total of 100 / 350 cc  Dressings changed.  Follow up in one week.

## 2021-07-29 ENCOUNTER — Ambulatory Visit: Payer: BC Managed Care – PPO | Admitting: Plastic Surgery

## 2021-07-31 ENCOUNTER — Encounter: Payer: Self-pay | Admitting: Plastic Surgery

## 2021-08-01 ENCOUNTER — Telehealth: Payer: Self-pay | Admitting: Student

## 2021-08-01 NOTE — Telephone Encounter (Signed)
Called patient today to discuss her questions she had via [place].  Patient reports she felt one of the drains have a somewhat forceful pull while in the shower.  She reports it still in place though.  She states she put tape over the area but reports that the area by the drain site is a little irritated now.  We discussed that she can continue to put tape over the area to secure it and that she may put Vaseline around the drain insertion site.  We also discussed patient's pain control.  Patient reports she is having pain near her right shoulder.  She states that she feels the muscle relaxer is not helping much.   Patient reports she is still taking Tylenol and ibuprofen which helps a little bit.  Patient does report she has not needed to take hydrocodone yesterday or today.  We discussed that she should continue with the tylenol and ibuprofen, and try to only use the hydrocodone if she continues to have pain after taking tylenol and ibuprofen. Discussed that she should continue to try to wean herself from the narcotic pain medication.  Patient acknowledged.  Discussed with patient that she may call with any questions or concerns.  Patient to follow-up in the clinic on Friday and evaluate then.  Patient agreed with the plan.

## 2021-08-02 ENCOUNTER — Telehealth: Payer: Self-pay | Admitting: Student

## 2021-08-02 ENCOUNTER — Other Ambulatory Visit: Payer: Self-pay | Admitting: Plastic Surgery

## 2021-08-02 ENCOUNTER — Encounter: Payer: Self-pay | Admitting: Plastic Surgery

## 2021-08-02 ENCOUNTER — Other Ambulatory Visit: Payer: Self-pay | Admitting: Student

## 2021-08-02 MED ORDER — OXYCODONE HCL 5 MG PO TABS: 5.0000 mg | ORAL_TABLET | Freq: Four times a day (QID) | ORAL | 0 refills | Status: AC | PRN

## 2021-08-02 MED ORDER — METHOCARBAMOL 500 MG PO TABS: 500.0000 mg | ORAL_TABLET | Freq: Four times a day (QID) | ORAL | 0 refills | Status: AC | PRN

## 2021-08-02 NOTE — Telephone Encounter (Signed)
Spoke with patient regarding her MyChart message.  We discussed her pain medications.  Patient states that last night in the middle of the night she had to take her last hydrocodone.  Patient also reports she does want a refill on the muscle relaxer to help with the pain.  Discussed with patient that Robaxin and oxycodone can be filled for another few days.  Also discussed that patient should try Voltaren cream over-the-counter for her right shoulder pain.  I cautioned patient when using Robaxin and oxycodone as they both can make her drowsy.  I also cautioned patient that she should make sure to not put Voltaren cream on any of the incision sites.  Patient acknowledged and agreed with plan.  Patient inquired about using a heating pad.  I discussed with patient that she should avoid using this as there is a risk for burn.  Patient acknowledged.  Plan to see patient this Friday and we will reevaluate her.  Will assess her pain status and her pain medication regimen at this visit.

## 2021-08-05 ENCOUNTER — Ambulatory Visit (INDEPENDENT_AMBULATORY_CARE_PROVIDER_SITE_OTHER): Payer: BC Managed Care – PPO | Admitting: Student

## 2021-08-05 ENCOUNTER — Encounter: Payer: BC Managed Care – PPO | Admitting: Adult Health

## 2021-08-05 DIAGNOSIS — Z9012 Acquired absence of left breast and nipple: Secondary | ICD-10-CM

## 2021-08-05 NOTE — Progress Notes (Signed)
Patient is a 41 year old female with history of breast cancer.  She underwent subsequent breast reconstruction.  Patient developed a wound over the left breast incision and expander.  She then underwent a left breast reconstruction with a latissimus muscle flap and tissue expander placement on 07/18/2021 with Dr. Marla Roe.   Patient was last seen in the clinic on 07/27/2021 for postoperative visit.  At this visit, patient had issues with pain.  On exam, the incisions were looking good and there w was no seroma or hematoma.  Her drains were left in place.  As of 06/13/2021, patient had 300 cc/455 cc in the right expander. It does not appear she expander fills between 06/13/21 and 07/27/21. Patient underwent expander fill at most recent visit on 07/27/20.  50 cc was placed in the right expander for a total of 350 cc/455 cc and 50 cc was placed in the left expander for a total of 100cc / 350 cc.  Today, patient is here for further postoperative follow up.  She reports she is feeling much better today.  She states that over the past few days she is to take 1-2 of the oxycodone.  She states that the Voltaren cream has been helping with her shoulder pain.  She denies any other issues or concerns at this time.  Patient reports the drains have been putting out approximately 20 cc per 24 hours for the past few days.    On exam, patient is sitting upright in no acute distress.  Back lateral incision is intact.  There is no erythema or drainage surrounding that incision.  The left breast skin paddle is viable.  The incisions are intact.  Left breast is soft.  There is no ecchymosis or erythema.  No significant fluid collection palpated on exam.  There are no signs of infection.  3 drains are in place and functioning.  There is approximately 10 to 25 cc of serosanguineous drainage in each of the bulbs.  The back superior drain was removed and the anterior drain was also removed.  Patient tolerated well.  Vaseline and  gauze were placed over these drain sites.  The back inferior drain was left in place.  We placed injectable saline in the Expanders using a sterile technique: Right: 30 cc for a total of 380 cc / 455 cc Left: 50 cc for a total of 150 cc / 350 cc   Discussed with patient that she may transition to compressive sports bra at this time.  Discussed that she may put Vaseline and gauze over the drain sites.  Discussed that she should still avoid lifting heavy objects that are greater than 10 to 15 pounds, overhead motion and strenuous activities.  Discussed that patient cannot submerge incisions.  Patient acknowledged.  Patient to follow-up next week.  Dr. Marla Roe also examined the patient.

## 2021-08-08 ENCOUNTER — Telehealth: Payer: Self-pay | Admitting: Student

## 2021-08-08 ENCOUNTER — Encounter: Payer: Self-pay | Admitting: Plastic Surgery

## 2021-08-08 NOTE — Telephone Encounter (Signed)
Called patient to check in to see how she is doing after her pain issues last week.  She was improving at her visit on Friday.  Today patient reports she is doing pretty well.  She states that she has been mainly using Tylenol and ibuprofen.  Patient reports that very occasionally has to take a muscle relaxer or an oxycodone.  Discussed with patient to be cautious when taking Tylenol and ibuprofen to not take over the recommended dose.   Patient reported that she has approximately 50 cc of drainage from her drain. She reports she was a little more active this weekend than she has been recently. Discussed with patient to continue to monitor drainage from bulb.  Discussed with patient to call us if she puts out copious amounts or if it becomes bright red.  Discussed with patient to call us if she has any questions or concerns.  Patient to follow-up in the clinic on Friday.

## 2021-08-10 ENCOUNTER — Encounter: Payer: Self-pay | Admitting: General Practice

## 2021-08-10 ENCOUNTER — Encounter: Payer: Self-pay | Admitting: Physical Therapy

## 2021-08-10 NOTE — Progress Notes (Signed)
[facility] Spiritual Care Note  Left voicemail of care and support, encouraging return call.   [place], [place], [facility] Pager [phone] Voicemail [phone]

## 2021-08-12 ENCOUNTER — Ambulatory Visit (INDEPENDENT_AMBULATORY_CARE_PROVIDER_SITE_OTHER): Payer: BC Managed Care – PPO | Admitting: Student

## 2021-08-12 DIAGNOSIS — Z9012 Acquired absence of left breast and nipple: Secondary | ICD-10-CM

## 2021-08-12 MED ORDER — METHOCARBAMOL 750 MG PO TABS: 750.0000 mg | ORAL_TABLET | Freq: Three times a day (TID) | ORAL | 0 refills | Status: AC | PRN

## 2021-08-12 NOTE — Progress Notes (Cosign Needed Addendum)
Patient is a 41 year old female with history of breast cancer.  She underwent breast reconstruction with tissue expander placement and Flex HD placement on 05/12/2021 with Dr. Marla Roe.  Patient had difficulties with wound healing over the left breast and underwent a left breast reconstruction with latissimus muscle flap and tissue expander placement on 07/18/2021 with Dr. Marla Roe.  Patient was last seen in clinic on 08/05/2021.  At this visit, patient was doing fairly well.  Earlier that week she had pain control issues, but she felt improvement in her pain at this visit.  She had no other issues or concerns.  On exam, the incisions were intact.  The left breast skin paddle is viable.  There there was no evidence of hematoma or seroma.  Her 3 drains were in place and functioning.  The posterior superior drain was removed and the anterior drain was removed.  Posterior inferior drain was left in place.  Patient also underwent expander fill.  30 cc was placed in the right expander for a total of 380 cc / 455 cc.  50 cc was placed in the left expander for a total of 150 cc / 350 cc.  Today, patient reports she has been having pain.  She states she feels the pain is muscular over, mostly over her left shoulder and scapula.  She reports she has been taking Tylenol and ibuprofen.  She states she has been taking the muscle relaxer and she reports she occasionally takes half of an oxycodone.  She also reports that she has been using the Voltaren cream to her left shoulder.  Patient reports she is still having pain with all of these medications.  Patient denies any other issues at this time.  She reports the drain has been draining approximately 50 cc/day.  Patient states she does not want to do an expander fill today given her pain.  Chaperone present on exam.  On exam, the back lateral incision is intact and healing well.  There are no fluid collections palpated to the back.  There is no erythema, ecchymosis or  swelling to the back.  The drain in the back is in place and functioning.  There is approximately 3 cc of serosanguineous drainage.  The incisions to the left breast are intact.  There is some dryness around the periphery of the skin paddle incisions, but the skin does not appear necrotic.  The skin paddle is viable.  There is no ecchymosis, drainage or swelling to the left breast.  There are no cellulitic changes.  Inframammary incision is intact with Steri-Strips.  Discussed with patient that the drain will be left in and reassessed at her next visit to see if it can be removed.  In terms of pain, plan on on increasing the dosage of Robaxin for the patient.  Discussed with patient to use caution when taking the Robaxin and her other medications including the oxycodone as these may make her drowsy.  Also discussed to not exceed the recommended dosage of Tylenol and ibuprofen as recommended on the labels of each medication.  Patient acknowledged.  Also discussed with the patient that we will refer her to pain management for potential lidocaine injections to help with the pain.  Patient agreed with the plan.  Discussed with patient to call us if she has any questions or concerns.  Patient to follow-up next week.  Dr. Marla Roe also examined the patient.  Discussed the plan with Dr. Marla Roe.

## 2021-08-15 ENCOUNTER — Ambulatory Visit: Payer: BC Managed Care – PPO | Attending: General Surgery

## 2021-08-15 VITALS — Wt 176.0 lb

## 2021-08-15 DIAGNOSIS — Z483 Aftercare following surgery for neoplasm: Secondary | ICD-10-CM | POA: Insufficient documentation

## 2021-08-15 NOTE — Therapy (Signed)
OUTPATIENT PHYSICAL THERAPY SOZO SCREENING NOTE   Patient Name: Haley Evans MRN: [id number] DOB:February 01, 1981, 41 y.o., female Today's Date: 08/15/2021  PCP: Berkley Harvey, NP REFERRING PROVIDER: Jovita Kussmaul, MD   PT End of Session - 08/15/21 1606     Visit Number 6   # unchanged due to screen only   PT Start Time 1603    PT Stop Time 1609    PT Time Calculation (min) 6 min    Activity Tolerance Patient tolerated treatment well    Behavior During Therapy [facility] for tasks assessed/performed             Past Medical History:  Diagnosis Date   Anemia    Anxiety    Bipolar 2 disorder ([place])    Cancer ([place])    breast   Depression    Family history of adverse reaction to anesthesia    aunt woke up during hip replacement   Seizure ([place])    vs syncopy, age 42 and 12/2017, Seen by neurologist, a definitive diagnosis was not made. Pt reports having seizure aroudn 2021   Past Surgical History:  Procedure Laterality Date   ARM WOUND REPAIR / CLOSURE Right    fractured arm   BREAST RECONSTRUCTION WITH PLACEMENT OF TISSUE EXPANDER AND FLEX HD (ACELLULAR HYDRATED DERMIS) Left 06/15/2021   Procedure: REPLACEMENT OF TISSUE EXPANDER AND FLEX HD (ACELLULAR HYDRATED DERMIS);  Surgeon: Wallace Going, DO;  Location: [place];  Service: Plastics;  Laterality: Left;   CESAREAN SECTION     DEBRIDEMENT AND CLOSURE WOUND Left 06/15/2021   Procedure: DEBRIDEMENT AND CLOSURE LEFT BREAST WOUND;  Surgeon: Wallace Going, DO;  Location: [place];  Service: Plastics;  Laterality: Left;  1 hour   LATISSIMUS FLAP TO BREAST Left 07/18/2021   Procedure: LATISSIMUS FLAP TO BREAST;  Surgeon: Wallace Going, DO;  Location: [place];  Service: Plastics;  Laterality: Left;   NIPPLE SPARING MASTECTOMY Right 05/12/2021   Procedure: RIGHT NIPPLE SPARING MASTECTOMY;  Surgeon: Jovita Kussmaul, MD;  Location: [place];  Service: General;  Laterality: Right;   NIPPLE SPARING MASTECTOMY  WITH SENTINEL LYMPH NODE BIOPSY Left 05/12/2021   Procedure: LEFT NIPPLE SPARING MASTECTOMY WITH LEFT SENTINEL LYMPH NODE BIOPSY;  Surgeon: Jovita Kussmaul, MD;  Location: [place];  Service: General;  Laterality: Left;   PRE-PECTORAL BREAST RECON W/ PLACEMENT OF TISSUE EXPANDERS, DERMAL GRAFT Bilateral 05/12/2021   Procedure: IMMEDIATE BILATERAL BREAST RECONSTRUCTION WITH PLACEMENT OF TISSUE EXPANDERS AND FLEX HD;  Surgeon: Wallace Going, DO;  Location: [place];  Service: Plastics;  Laterality: Bilateral;   TISSUE EXPANDER PLACEMENT Left 07/18/2021   Procedure: REMOVAL AND REPLACEMENT OF TISSUE EXPANDER;  Surgeon: Wallace Going, DO;  Location: [place];  Service: Plastics;  Laterality: Left;  3 hours   Patient Active Problem List   Diagnosis Date Noted   Admission for removal of tissue expander of breast, unspecified laterality 07/18/2021   Acquired absence of left breast 07/18/2021   Open wound of left breast 07/15/2021   Breast cancer ([place]) 05/12/2021   Genetic testing 03/18/2021   Family history of pancreatic cancer 03/10/2021   Malignant neoplasm of upper-outer quadrant of left breast in female, estrogen receptor positive ([place]) 03/07/2021   Anxiety 06/25/2011    REFERRING DIAG: left breast cancer at risk for lymphedema  THERAPY DIAG:  Aftercare following surgery for neoplasm  PERTINENT HISTORY: Patient was diagnosed on 01/12/2021 with left grade  II invasive ductal carcinoma breast cancer. She underwent a bilateral nipple sparing mastectomy and left sentinel node biopsy (3 negative nodes) on 05/12/2021 with expanders placed for reconstruction. It is ER/PR positive and HER2 negative with a Ki67 of 1%.   PRECAUTIONS: left UE Lymphedema risk, None  SUBJECTIVE: Pt returns for her 3 month L-Dex screen.   PAIN:  Are you having pain? No  SOZO SCREENING: Patient was assessed today using the SOZO machine to determine the lymphedema index score.  This was compared to her baseline score. It was determined that she is within the recommended range when compared to her baseline and no further action is needed at this time. She will continue SOZO screenings. These are done every 3 months for 2 years post operatively followed by every 6 months for 2 years, and then annually.    Otelia Limes, PTA 08/15/2021, 4:08 PM

## 2021-08-15 NOTE — Addendum Note (Signed)
Addended by: Donnamarie Rossetti on: 08/15/2021 09:54 AM   Modules accepted: Orders

## 2021-08-15 NOTE — Addendum Note (Signed)
Addended by: Wallace Going on: 08/15/2021 10:12 AM   Modules accepted: Orders

## 2021-08-15 NOTE — Addendum Note (Signed)
Addended by: Donnamarie Rossetti on: 08/15/2021 12:21 PM   Modules accepted: Orders

## 2021-08-16 ENCOUNTER — Encounter: Payer: Self-pay | Admitting: Plastic Surgery

## 2021-08-18 ENCOUNTER — Telehealth: Payer: Self-pay

## 2021-08-18 ENCOUNTER — Encounter: Payer: Self-pay | Admitting: Physical Medicine & Rehabilitation

## 2021-08-18 NOTE — Telephone Encounter (Signed)
Pt called in stating that her PT does do dry needling. She is going to message them to see if there needs to be a referral from [place] in order to proceed.  Also-Pt stated that at her last visit pain medication was discussed but there isn't a script at her pharmacy. She just wanted to check on that as well.   Please advise.

## 2021-08-19 ENCOUNTER — Other Ambulatory Visit (INDEPENDENT_AMBULATORY_CARE_PROVIDER_SITE_OTHER): Payer: BC Managed Care – PPO | Admitting: Physician Assistant

## 2021-08-19 DIAGNOSIS — N644 Mastodynia: Secondary | ICD-10-CM

## 2021-08-19 DIAGNOSIS — Z9889 Other specified postprocedural states: Secondary | ICD-10-CM

## 2021-08-19 MED ORDER — CYCLOBENZAPRINE HCL 10 MG PO TABS: 10.0000 mg | ORAL_TABLET | Freq: Two times a day (BID) | ORAL | 0 refills | Status: AC | PRN

## 2021-08-19 NOTE — Progress Notes (Signed)
Patient is a 41 year old with PMH of left-sided breast cancer s/p bilateral breast reconstruction performed 05/12/2021 who unfortunately developed complications of bilateral nipple and mastectomy flap necrosis which led to left-sided latissimus muscle flap reconstruction 07/19/2021.  While she is now approximately 1 month postop, she continues to experience considerable pain.  She was referred to outpatient pain management given her intractable symptoms, but unfortunately they would not be available to see her until 09/2021.  In the interim, she is having difficulty sleeping and functioning due to her ongoing pain symptoms.  Discussed trial of Flexeril and patient is agreeable.  She will continue with Tylenol and ibuprofen in interim.  We will have patient return to clinic early next week for reevaluation and likely tube removal.  We will reassess pain at that time.

## 2021-08-22 ENCOUNTER — Ambulatory Visit (INDEPENDENT_AMBULATORY_CARE_PROVIDER_SITE_OTHER): Payer: BC Managed Care – PPO | Admitting: Physician Assistant

## 2021-08-22 ENCOUNTER — Other Ambulatory Visit: Payer: Self-pay | Admitting: *Deleted

## 2021-08-22 ENCOUNTER — Encounter: Payer: Self-pay | Admitting: Plastic Surgery

## 2021-08-22 DIAGNOSIS — C50412 Malignant neoplasm of upper-outer quadrant of left female breast: Secondary | ICD-10-CM

## 2021-08-22 DIAGNOSIS — Z9889 Other specified postprocedural states: Secondary | ICD-10-CM

## 2021-08-22 DIAGNOSIS — Z170 Estrogen receptor positive status [ER+]: Secondary | ICD-10-CM

## 2021-08-22 MED ORDER — MELOXICAM 7.5 MG PO TABS: 7.5000 mg | ORAL_TABLET | Freq: Two times a day (BID) | ORAL | 0 refills | Status: DC | PRN

## 2021-08-22 NOTE — Progress Notes (Signed)
Patient is a 41 year old with PMH of left-sided breast cancer s/p bilateral breast reconstruction performed 05/12/2021 who unfortunately developed complications of bilateral nipple and mastectomy flap necrosis which led to left-sided latissimus muscle flap reconstruction 07/19/2021.  She was last seen here in clinic on 08/12/2021.  At that time, she was complaining of discomfort.  She had been using Voltaren cream and alternating Tylenol and ibuprofen.  She was trying to use her muscle relaxants and oxycodone sparingly.  The remaining drain had still been putting off approximately 50 cc/day and decision was made not to remove it until output slowed.  Physical exam was largely reassuring.  Discussed plan of increasing her Robaxin from 500 mg to 750 mg every 12 hours.  She was also referred to outpatient pain medicine clinic for chronic pain.  She then called the office last week complaining of ongoing pain symptoms unimproved despite increased Robaxin.  Discussed Flexeril as an alternative and she was agreeable.  She also complained of some discomfort at the drain tube insertion site and scheduled her for reevaluation for today for possible removal.  Today, patient states that the Flexeril has helped with her sleep and helps a little bit with muscle relaxation, but she continues to experience pain symptoms.  She is frustrated because she has not yet been able to resume normal ADLs due to her pain being limiting factor.  She feels as though family and friends do not quite understand just how much pain is involved with her surgeries, particularly the most recent one.  Patient tells me that if she had known exactly how difficult for recovery this would entail, she would have thought twice about continued pursuit for reconstruction.  She does state that her drain has been putting out less than 25 cc/day for the past few days.  She is hoping it can be removed today.  She also inquires about PT and dry needling as  options to help with her pain and ROM.  Patient states that she has had conflicting opinions about when she can initiate therapy postoperatively.  Physical exam is reassuring.  Incision repair over left upper back is healing nicely, single suture removed without complication.  Expander palpated over left chest wall.  No obvious subcutaneous fluid collections.  JP drain intact and functional, normal-appearing drainage in bulb.  Removed without complication or difficulty.  Tube insertion site without evidence concerning for infection.  Fortunately she has had some improvement in difficulty sleeping with the muscle relaxant and does not feel as though her discomfort is worsening.  However, her pain is still poorly controlled and she is concerned that she will not be able to wait until the pain clinic can accommodate her late July/early August.  We will prescribe Mobic twice daily and tell her to hold all other NSAIDs.  Continue with Tylenol and Flexeril, as directed.  She also may continue with the Voltaren gel.  I do feel as though referral to PT/dry needling could be beneficial given her lack of response to medical management.  She will discuss this further with Dr. Marla Roe next week when we will plan for subsequent encounter.  Discussed possible expander fill, but given her pain symptoms will hold off until she is feeling slightly improved.

## 2021-08-23 ENCOUNTER — Encounter: Payer: BC Managed Care – PPO | Admitting: Student

## 2021-08-23 ENCOUNTER — Telehealth: Payer: Self-pay | Admitting: *Deleted

## 2021-08-23 ENCOUNTER — Other Ambulatory Visit: Payer: Self-pay

## 2021-08-23 ENCOUNTER — Ambulatory Visit: Payer: BC Managed Care – PPO | Attending: Hematology and Oncology

## 2021-08-23 DIAGNOSIS — M25612 Stiffness of left shoulder, not elsewhere classified: Secondary | ICD-10-CM | POA: Insufficient documentation

## 2021-08-23 DIAGNOSIS — C50412 Malignant neoplasm of upper-outer quadrant of left female breast: Secondary | ICD-10-CM | POA: Diagnosis not present

## 2021-08-23 DIAGNOSIS — Z170 Estrogen receptor positive status [ER+]: Secondary | ICD-10-CM | POA: Diagnosis not present

## 2021-08-23 DIAGNOSIS — R293 Abnormal posture: Secondary | ICD-10-CM | POA: Diagnosis present

## 2021-08-23 DIAGNOSIS — R252 Cramp and spasm: Secondary | ICD-10-CM | POA: Insufficient documentation

## 2021-08-23 DIAGNOSIS — Z483 Aftercare following surgery for neoplasm: Secondary | ICD-10-CM | POA: Diagnosis present

## 2021-08-23 NOTE — Telephone Encounter (Signed)
Received VM from patient regarding some paperwork. Returned patient's call but had to VM for her to return my phone call

## 2021-08-25 ENCOUNTER — Encounter: Payer: Self-pay | Admitting: Hematology and Oncology

## 2021-08-31 NOTE — Progress Notes (Deleted)
Patient is a 41 year old female with history of breast cancer.  She underwent breast reconstruction with tissue expander placement and Flex HD placement on 05/12/2021 with Dr. Marla Roe.  Patient had difficulties with wound healing over the left breast and underwent a left breast reconstruction with latissimus muscle flap and tissue expander placement on 07/18/2021 with Dr. Marla Roe.  Patient was last seen in the clinic on 08/22/2021.  At this visit, patient reported she still had some pain symptoms.  She reported the Flexeril helped her a little bit with sleep and muscle relaxation.  On exam, the incision over the left upper back was healing nicely.  JP drain was intact and functional.  Mobic was prescribed.  Patient instructed to continue Tylenol and Flexeril, as well as the Voltaren gel.  Today,

## 2021-09-01 ENCOUNTER — Encounter: Payer: BC Managed Care – PPO | Admitting: Adult Health

## 2021-09-02 ENCOUNTER — Ambulatory Visit (INDEPENDENT_AMBULATORY_CARE_PROVIDER_SITE_OTHER): Payer: BC Managed Care – PPO | Admitting: Student

## 2021-09-02 ENCOUNTER — Encounter: Payer: Self-pay | Admitting: Student

## 2021-09-02 ENCOUNTER — Ambulatory Visit: Payer: BC Managed Care – PPO | Admitting: Student

## 2021-09-02 DIAGNOSIS — Z9889 Other specified postprocedural states: Secondary | ICD-10-CM

## 2021-09-02 NOTE — Progress Notes (Signed)
Patient is a 41 year old female with history of breast cancer.  She underwent breast reconstruction with tissue expander placement and Flex HD placement on 05/12/2021 with Dr. Marla Roe.  Patient's had difficulties with wound healing over the left breast and underwent a left breast reconstruction with latissimus flap and tissue expander placement on 07/18/2021 with Dr. Marla Roe.  Patient was last seen in the clinic on 08/22/2021.  At that visit, patient reported she still had some pain symptoms.  Her drain at that time was putting out 25 cc/day.  On exam, her incision on her back was healing well.  There were no signs of infection.  Her JP drain was removed.  She was prescribed Mobic and told to continue Tylenol and Flexeril.  Patient also interested in pursuing dry needling with physical therapy at that time.  Today, patient reports she is doing really well.  She states that she has been getting the dry needling done by physical therapy and it has tremendously helped her pain.  She reports she is no longer taking the Mobic, but she takes the Flexeril, ibuprofen and Tylenol.  She states her pain has been overall fairly controlled.  She denies any other issues at this time.  She denies any fevers or chills.  Chaperone present on exam.  On exam, patient is sitting upright in no acute distress.  Her back incision is intact.  Breasts are fairly symmetric.  Expanders in place bilaterally.  The left skin paddle appears viable.  Incision around the skin paddle is intact.  There is no erythema overlying the breast.  There is no drainage from the incision.  We placed injectable saline in the Expander using a sterile technique: Right: 30 cc for a total of 410 cc/ 455 cc Left: 30 cc for a total of 180 cc / 350 cc   Discussed with patient she may continue dry needling with physical therapy.  Discussed with patient to avoid strenuous activities and avoid submerging the incisions.  Discussed with patient to keep the  inframammary area clean and dry.  Patient to follow-up in 2 weeks.  Discussed with patient to call if she has any questions or concerns.  Dr. Marla Roe also examined the patient.  Plan discussed with Dr. Marla Roe.

## 2021-09-05 ENCOUNTER — Ambulatory Visit: Payer: BC Managed Care – PPO | Attending: Hematology and Oncology

## 2021-09-05 DIAGNOSIS — Z483 Aftercare following surgery for neoplasm: Secondary | ICD-10-CM | POA: Diagnosis not present

## 2021-09-05 DIAGNOSIS — R293 Abnormal posture: Secondary | ICD-10-CM | POA: Insufficient documentation

## 2021-09-05 DIAGNOSIS — R252 Cramp and spasm: Secondary | ICD-10-CM | POA: Insufficient documentation

## 2021-09-05 DIAGNOSIS — M25612 Stiffness of left shoulder, not elsewhere classified: Secondary | ICD-10-CM | POA: Insufficient documentation

## 2021-09-05 NOTE — Therapy (Signed)
OUTPATIENT PHYSICAL THERAPY TREATMENT   Patient Name: Haley Evans MRN: [id number] DOB:02-15-1981, 41 y.o., female Today's Date: 09/05/2021   PT End of Session - 09/05/21 1146     Visit Number 2    Number of Visits 10    Date for PT Re-Evaluation 10/18/21    Authorization Type BCBS    PT Start Time 1103    PT Stop Time 1145    PT Time Calculation (min) 42 min    Activity Tolerance Patient tolerated treatment well    Behavior During Therapy WFL for tasks assessed/performed              Past Medical History:  Diagnosis Date   Anemia    Anxiety    Bipolar 2 disorder ([place])    Cancer ([place])    breast   Depression    Family history of adverse reaction to anesthesia    aunt woke up during hip replacement   Seizure ([place])    vs syncopy, age 51 and 12/2017, Seen by neurologist, a definitive diagnosis was not made. Pt reports having seizure aroudn 2021   Past Surgical History:  Procedure Laterality Date   ARM WOUND REPAIR / CLOSURE Right    fractured arm   BREAST RECONSTRUCTION WITH PLACEMENT OF TISSUE EXPANDER AND FLEX HD (ACELLULAR HYDRATED DERMIS) Left 06/15/2021   Procedure: REPLACEMENT OF TISSUE EXPANDER AND FLEX HD (ACELLULAR HYDRATED DERMIS);  Surgeon: Wallace Going, DO;  Location: [facility];  Service: Plastics;  Laterality: Left;   CESAREAN SECTION     DEBRIDEMENT AND CLOSURE WOUND Left 06/15/2021   Procedure: DEBRIDEMENT AND CLOSURE LEFT BREAST WOUND;  Surgeon: Wallace Going, DO;  Location: [facility];  Service: Plastics;  Laterality: Left;  1 hour   LATISSIMUS FLAP TO BREAST Left 07/18/2021   Procedure: LATISSIMUS FLAP TO BREAST;  Surgeon: Wallace Going, DO;  Location: [facility];  Service: Plastics;  Laterality: Left;   NIPPLE SPARING MASTECTOMY Right 05/12/2021   Procedure: RIGHT NIPPLE SPARING MASTECTOMY;  Surgeon: Jovita Kussmaul, MD;  Location: [facility];  Service: General;  Laterality: Right;   NIPPLE SPARING MASTECTOMY WITH SENTINEL  LYMPH NODE BIOPSY Left 05/12/2021   Procedure: LEFT NIPPLE SPARING MASTECTOMY WITH LEFT SENTINEL LYMPH NODE BIOPSY;  Surgeon: Jovita Kussmaul, MD;  Location: [facility];  Service: General;  Laterality: Left;   PRE-PECTORAL BREAST RECON W/ PLACEMENT OF TISSUE EXPANDERS, DERMAL GRAFT Bilateral 05/12/2021   Procedure: IMMEDIATE BILATERAL BREAST RECONSTRUCTION WITH PLACEMENT OF TISSUE EXPANDERS AND FLEX HD;  Surgeon: Wallace Going, DO;  Location: [facility];  Service: Plastics;  Laterality: Bilateral;   TISSUE EXPANDER PLACEMENT Left 07/18/2021   Procedure: REMOVAL AND REPLACEMENT OF TISSUE EXPANDER;  Surgeon: Wallace Going, DO;  Location: [facility];  Service: Plastics;  Laterality: Left;  3 hours   Patient Active Problem List   Diagnosis Date Noted   Admission for removal of tissue expander of breast, unspecified laterality 07/18/2021   Acquired absence of left breast 07/18/2021   Open wound of left breast 07/15/2021   Breast cancer ([place]) 05/12/2021   Genetic testing 03/18/2021   Family history of pancreatic cancer 03/10/2021   Malignant neoplasm of upper-outer quadrant of left breast in female, estrogen receptor positive ([facility]) 03/07/2021   Anxiety 06/25/2011    PCP: Eldridge Abrahams, NP  REFERRING PROVIDER: Nicholas Lose, MD  REFERRING DIAG: [phone] (ICD-10-CM) - Malignant neoplasm of upper-outer quadrant of left breast in female, estrogen receptor  positive ([place]  THERAPY DIAG:  Aftercare following surgery for neoplasm  Abnormal posture  Cramp and spasm  Stiffness of left shoulder, not elsewhere classified  Rationale for Evaluation and Treatment Rehabilitation  ONSET DATE: shoulder pain began after surgery last month  SUBJECTIVE:                                                                                                                                                                                                         SUBJECTIVE  STATEMENT: MD has allowed P/ROM of the Lt shoulder and told patient to got back to her gentle ROM exercises at home.The dry needling really helped.  I felt so much better.  I have some tightness over the past few days. It feels 69-75% better.    PERTINENT HISTORY:  Patient was diagnosed on 01/12/2021 with left grade II invasive ductal carcinoma breast cancer. She underwent a bilateral nipple sparing mastectomy and left sentinel node biopsy (3 negative nodes) on 05/12/2021 with expanders placed for reconstruction. It is ER/PR positive and HER2 negative with a Ki67 of 1%. Muscle flap reconstruction performed 5/63/87 due to complications.   PAIN:  Are you having pain? Yes: NPRS scale: max 5-6/10 Pain location: Lt shoulder and scapular region  Pain description: aching, numbness Aggravating factors: sleep at night,  Relieving factors: pain meds  PRECAUTIONS: Other: s/p mastectomy with complications  WEIGHT BEARING RESTRICTIONS none   FALLS:  Has patient fallen in last 6 months? No  LIVING ENVIRONMENT: Lives with: lives with their family Lives in: House/apartment   OCCUPATION: Pt is on FMLA  PLOF: Independent  PATIENT GOALS reduce Lt UE pain, return to prior level of function  OBJECTIVE:   DIAGNOSTIC FINDINGS:  NA  PATIENT SURVEYS:  Quick Dash 56   COGNITION: Overall cognitive status: Within functional limits for tasks assessed   SENSATION: WFL  POSTURE: rounded shoulders  PALPATION: Trigger points in bil rhomboids, subscapularis and upper points on the Lt, reduced scapular mobility.  Healing surgical incisions in Lt lats, and distal to Lt breast.  Edema distal/lateral portion of Lt breast.     CERVICAL ROM:   WFLS with Lt UT pain with Rt sidebending UPPER EXTREMITY ROM:  Active ROM Right eval Left eval Left 09/05/21  Shoulder flexion  90 110 P/ROM  Shoulder extension     Shoulder abduction  70   Shoulder adduction     Shoulder extension     Shoulder  internal rotation  To Lt gluteals To L1 (active)  Shoulder external rotation     Elbow  flexion     Elbow extension     Wrist flexion     Wrist extension     Wrist ulnar deviation     Wrist radial deviation     Wrist pronation     Wrist supination      (Blank rows = not tested)  UPPER EXTREMITY MMT: Tested in neutral: flexion 4-/5, ext 4/5, IR 4/5, ER 4-/5    TODAY'S TREATMENT:  09/05/21:  Trigger Point Dry-Needling  Treatment instructions: Expect mild to moderate muscle soreness. S/S of pneumothorax if dry needled over a lung field, and to seek immediate medical attention should they occur. Patient verbalized understanding of these instructions and education.  Patient Consent Given: Yes Education handout provided: Yes Muscles treated: Lt rhomboids, subscapularis, upper traps, Lt lats along rib cage  Treatment response/outcome: Utilized skilled palpation to identify trigger points.  During dry needling able to palpate muscle twitch and muscle elongation   Skilled palpation and monitoring by PT during dry needling  Elongation and release to Lt scapula after DN Manual: P/ROM to Lt shoulder into flexion, IR/ER to pt tolerance  08/23/21:  Trigger Point Dry-Needling  Treatment instructions: Expect mild to moderate muscle soreness. S/S of pneumothorax if dry needled over a lung field, and to seek immediate medical attention should they occur. Patient verbalized understanding of these instructions and education.  Patient Consent Given: Yes Education handout provided: Yes Muscles treated: Lt rhomboids, subscapularis, upper traps  Treatment response/outcome: Utilized skilled palpation to identify trigger points.  During dry needling able to palpate muscle twitch and muscle elongation   Skilled palpation and monitoring by PT during dry needling  Elongation and release to Lt scapula after DN   PATIENT EDUCATION:  DN info   HOME EXERCISE PROGRAM: NA- pt has HEP issued in prior PT  and PT is checking with MD to determine when she will be allowed to resume.   ASSESSMENT:  CLINICAL IMPRESSION: Pt reports 60-70% overall reduction in Lt shoulder and scapular pain.  Pt reports 3-4/10 average pain compared to 9/10 prior to DN last session.  Pt has visited with MD and has received clearance for P/ROM and is able to resume Lt shoulder ROM exercises issued prior to surgery.  Pt with trigger points and had good response to DN and manual therapy today.  A/ROM is improved in flexion and IR.  Patient will benefit from skilled PT to address the below impairments and improve overall function.    OBJECTIVE IMPAIRMENTS decreased ROM, decreased strength, increased edema, increased fascial restrictions, increased muscle spasms, postural dysfunction, and pain.   ACTIVITY LIMITATIONS bathing, dressing, reach over head, and hygiene/grooming  PARTICIPATION LIMITATIONS: meal prep, cleaning, driving, and community activity  PERSONAL FACTORS 1 comorbidity: breast surgery with complications   are also affecting patient's functional outcome.   REHAB POTENTIAL: Good  CLINICAL DECISION MAKING: Stable/uncomplicated  EVALUATION COMPLEXITY: Low   GOALS: Goals reviewed with patient? Yes  SHORT TERM GOALS: Target date: 09/20/2021   Be independent in initial HEP as allowed by MD Baseline:  Goal status: INITIAL  2.  Report > or = to 30% reduction in Lt shoulder/scapular pain with ADLs and self-care Baseline: 09/05/21 Goal status: MET  3.  Demonstrate> or = to 110 degrees of Lt shoulder flexion A/ROM to improve overhead reaching  Baseline: 90 Goal status: INITIAL  4.  Improve DASH to < or = to 40  Baseline: 56 Goal status: INITIAL  LONG TERM GOALS: Target date: 10/18/2021  Be independent  in advanced HEP Baseline:  Goal status: INITIAL  2.  Improve Lt shoulder A/ROM flexion to > or = to 135 degrees to improve overhead reaching  Baseline: 90 Goal status: INITIAL  3.  Improve DASH to  < or = to 25 Baseline: 56 Goal status: INITIAL  4.  Report < or = to 3/10 Lt shoulder/scapular pain with use  Baseline:  Goal status: INITIAL  5.  Demonstrate Lt shoulder A/ROM IR to > or = to T10 to improve self-care Baseline: gluteals  Goal status: INITIAL     PLAN: PT FREQUENCY: 1-2x/week  PT DURATION: 8 weeks  PLANNED INTERVENTIONS: Therapeutic exercises, Therapeutic activity, Neuromuscular re-education, Patient/Family education, Joint mobilization, Dry Needling, Cryotherapy, Moist heat, scar mobilization, Taping, and Manual therapy  PLAN FOR NEXT SESSION: Manual, gentle flexibility, DN 1x/wk    Sigurd Sos, PT 09/05/21 11:47 AM   [facility] [address], [place], [place] [address] Phone # [phone] Fax [phone]

## 2021-09-07 ENCOUNTER — Ambulatory Visit: Payer: BC Managed Care – PPO

## 2021-09-12 ENCOUNTER — Ambulatory Visit: Payer: BC Managed Care – PPO

## 2021-09-12 ENCOUNTER — Encounter: Payer: Self-pay | Admitting: *Deleted

## 2021-09-12 DIAGNOSIS — Z483 Aftercare following surgery for neoplasm: Secondary | ICD-10-CM | POA: Diagnosis not present

## 2021-09-12 DIAGNOSIS — R293 Abnormal posture: Secondary | ICD-10-CM

## 2021-09-12 DIAGNOSIS — R252 Cramp and spasm: Secondary | ICD-10-CM

## 2021-09-12 DIAGNOSIS — M25612 Stiffness of left shoulder, not elsewhere classified: Secondary | ICD-10-CM

## 2021-09-12 NOTE — Therapy (Signed)
OUTPATIENT PHYSICAL THERAPY TREATMENT   Patient Name: Haley Evans MRN: [id number] DOB:Jul 08, 1980, 41 y.o., female Today's Date: 09/12/2021   PT End of Session - 09/12/21 1614     Visit Number 3    Date for PT Re-Evaluation 10/18/21    Authorization Type BCBS    PT Start Time 1533    PT Stop Time 1615    PT Time Calculation (min) 42 min    Activity Tolerance Patient tolerated treatment well    Behavior During Therapy WFL for tasks assessed/performed               Past Medical History:  Diagnosis Date   Anemia    Anxiety    Bipolar 2 disorder ([facility])    Cancer ([facility])    breast   Depression    Family history of adverse reaction to anesthesia    aunt woke up during hip replacement   Seizure ([facility])    vs syncopy, age 22 and 12/2017, Seen by neurologist, a definitive diagnosis was not made. Pt reports having seizure aroudn 2021   Past Surgical History:  Procedure Laterality Date   ARM WOUND REPAIR / CLOSURE Right    fractured arm   BREAST RECONSTRUCTION WITH PLACEMENT OF TISSUE EXPANDER AND FLEX HD (ACELLULAR HYDRATED DERMIS) Left 06/15/2021   Procedure: REPLACEMENT OF TISSUE EXPANDER AND FLEX HD (ACELLULAR HYDRATED DERMIS);  Surgeon: Peggye Form, DO;  [facility];  Service: Plastics;  Laterality: Left;   CESAREAN SECTION     DEBRIDEMENT AND CLOSURE WOUND Left 06/15/2021   Procedure: DEBRIDEMENT AND CLOSURE LEFT BREAST WOUND;  Surgeon: Peggye Form, DO;  [facility];  Service: Plastics;  Laterality: Left;  1 hour   LATISSIMUS FLAP TO BREAST Left 07/18/2021   Procedure: LATISSIMUS FLAP TO BREAST;  Surgeon: Peggye Form, DO;  [facility];  Service: Plastics;  Laterality: Left;   NIPPLE SPARING MASTECTOMY Right 05/12/2021   Procedure: RIGHT NIPPLE SPARING MASTECTOMY;  Surgeon: Griselda Miner, MD;  Location: [facility];  Service: General;  Laterality: Right;   NIPPLE SPARING MASTECTOMY WITH SENTINEL LYMPH NODE BIOPSY Left  05/12/2021   Procedure: LEFT NIPPLE SPARING MASTECTOMY WITH LEFT SENTINEL LYMPH NODE BIOPSY;  Surgeon: Griselda Miner, MD;  Location: [facility];  Service: General;  Laterality: Left;   PRE-PECTORAL BREAST RECON W/ PLACEMENT OF TISSUE EXPANDERS, DERMAL GRAFT Bilateral 05/12/2021   Procedure: IMMEDIATE BILATERAL BREAST RECONSTRUCTION WITH PLACEMENT OF TISSUE EXPANDERS AND FLEX HD;  Surgeon: Peggye Form, DO;  [facility];  Service: Plastics;  Laterality: Bilateral;   TISSUE EXPANDER PLACEMENT Left 07/18/2021   Procedure: REMOVAL AND REPLACEMENT OF TISSUE EXPANDER;  Surgeon: Peggye Form, DO;  [facility];  Service: Plastics;  Laterality: Left;  3 hours   Patient Active Problem List   Diagnosis Date Noted   Admission for removal of tissue expander of breast, unspecified laterality 07/18/2021   Acquired absence of left breast 07/18/2021   Open wound of left breast 07/15/2021   Breast cancer ([facility]) 05/12/2021   Genetic testing 03/18/2021   Family history of pancreatic cancer 03/10/2021   Malignant neoplasm of upper-outer quadrant of left breast in female, estrogen receptor positive ([facility]) 03/07/2021   Anxiety 06/25/2011    PCP: Zoe Lan, NP  REFERRING PROVIDER: Serena Croissant, MD  REFERRING DIAG: [phone] (ICD-10-CM) - Malignant neoplasm of upper-outer quadrant of left breast in female, estrogen receptor positive ([facility]  THERAPY DIAG:  Aftercare following surgery for neoplasm  Abnormal posture  Cramp and spasm  Stiffness of left shoulder, not elsewhere classified  Rationale for Evaluation and Treatment Rehabilitation  ONSET DATE: shoulder pain began after surgery last month  SUBJECTIVE:                                                                                                                                                                                                         SUBJECTIVE STATEMENT: Lt shoulder  blade feels tight again.  I have been stretching my shoulder at home.  I have been using my Lt arm more with home tasks.    PERTINENT HISTORY:  Patient was diagnosed on 01/12/2021 with left grade II invasive ductal carcinoma breast cancer. She underwent a bilateral nipple sparing mastectomy and left sentinel node biopsy (3 negative nodes) on 05/12/2021 with expanders placed for reconstruction. It is ER/PR positive and HER2 negative with a Ki67 of 1%. Muscle flap reconstruction performed 07/19/21 due to complications.   PAIN:  Are you having pain? Yes: NPRS scale: max 6-7/10 Pain location: Lt shoulder and scapular region  Pain description: aching, numbness Aggravating factors: sleep at night,  Relieving factors: pain meds  PRECAUTIONS: Other: s/p mastectomy with complications  WEIGHT BEARING RESTRICTIONS none   FALLS:  Has patient fallen in last 6 months? No  LIVING ENVIRONMENT: Lives with: lives with their family Lives in: House/apartment   OCCUPATION: Pt is on FMLA  PLOF: Independent  PATIENT GOALS reduce Lt UE pain, return to prior level of function  OBJECTIVE:   DIAGNOSTIC FINDINGS:  NA  PATIENT SURVEYS:  Quick Dash 56   COGNITION: Overall cognitive status: Within functional limits for tasks assessed   SENSATION: WFL  POSTURE: rounded shoulders  PALPATION: Trigger points in bil rhomboids, subscapularis and upper points on the Lt, reduced scapular mobility.  Healing surgical incisions in Lt lats, and distal to Lt breast.  Edema distal/lateral portion of Lt breast.     CERVICAL ROM:   WFLS with Lt UT pain with Rt sidebending UPPER EXTREMITY ROM:  Active ROM Right eval Left eval Left 09/05/21 Left 09/12/21  Shoulder flexion  90 110 P/ROM 115 active  Shoulder extension      Shoulder abduction  70  100 active   Shoulder adduction      Shoulder extension      Shoulder internal rotation  To Lt gluteals To L1 (active) T8 (active   Shoulder external rotation       Elbow flexion      Elbow extension  Wrist flexion      Wrist extension      Wrist ulnar deviation      Wrist radial deviation      Wrist pronation      Wrist supination       (Blank rows = not tested)  UPPER EXTREMITY MMT: Tested in neutral: flexion 4-/5, ext 4/5, IR 4/5, ER 4-/5    TODAY'S TREATMENT:  09/12/21:  Overhead pulleys: flexion; x 3 minutes  Trigger Point Dry-Needling  Treatment instructions: Expect mild to moderate muscle soreness. S/S of pneumothorax if dry needled over a lung field, and to seek immediate medical attention should they occur. Patient verbalized understanding of these instructions and education.  Patient Consent Given: Yes Education handout provided: Yes Muscles treated: Lt rhomboids, subscapularis, upper traps, Lt lats along rib cage  Treatment response/outcome: Utilized skilled palpation to identify trigger points.  During dry needling able to palpate muscle twitch and muscle elongation   Skilled palpation and monitoring by PT during dry needling  Elongation and release to Lt scapula after DN Manual: P/ROM to Lt shoulder into flexion, IR/ER to pt tolerance Scar massage to lat scar and PT educated pt on how to mobilize at home  09/05/21:  Trigger Point Dry-Needling  Treatment instructions: Expect mild to moderate muscle soreness. S/S of pneumothorax if dry needled over a lung field, and to seek immediate medical attention should they occur. Patient verbalized understanding of these instructions and education.  Patient Consent Given: Yes Education handout provided: Yes Muscles treated: Lt rhomboids, subscapularis, upper traps, Lt lats along rib cage  Treatment response/outcome: Utilized skilled palpation to identify trigger points.  During dry needling able to palpate muscle twitch and muscle elongation   Skilled palpation and monitoring by PT during dry needling  Elongation and release to Lt scapula after DN Manual: P/ROM to Lt shoulder  into flexion, IR/ER to pt tolerance  08/23/21:  Trigger Point Dry-Needling  Treatment instructions: Expect mild to moderate muscle soreness. S/S of pneumothorax if dry needled over a lung field, and to seek immediate medical attention should they occur. Patient verbalized understanding of these instructions and education.  Patient Consent Given: Yes Education handout provided: Yes Muscles treated: Lt rhomboids, subscapularis, upper traps  Treatment response/outcome: Utilized skilled palpation to identify trigger points.  During dry needling able to palpate muscle twitch and muscle elongation   Skilled palpation and monitoring by PT during dry needling  Elongation and release to Lt scapula after DN   PATIENT EDUCATION:  DN info   HOME EXERCISE PROGRAM: NA- pt has HEP issued in prior PT and PT is checking with MD to determine when she will be allowed to resume.   ASSESSMENT:  CLINICAL IMPRESSION: Pt with some increased scapular pain today as she has been using her Lt UE more than she previously had been.  Pt had been able to vacuum and pt reports reduced use of pain meds.  Pt did well with pulleys today and will consider getting for home. Pt with trigger points and had good response to DN and manual therapy today.  A/ROM continues to improve each visit.  PT educated pt on how to mobilize her incisions gently.  Patient will benefit from skilled PT to address the below impairments and improve overall function.    OBJECTIVE IMPAIRMENTS decreased ROM, decreased strength, increased edema, increased fascial restrictions, increased muscle spasms, postural dysfunction, and pain.   ACTIVITY LIMITATIONS bathing, dressing, reach over head, and hygiene/grooming  PARTICIPATION LIMITATIONS: meal prep,  cleaning, driving, and community activity  PERSONAL FACTORS 1 comorbidity: breast surgery with complications   are also affecting patient's functional outcome.   REHAB POTENTIAL: Good  CLINICAL  DECISION MAKING: Stable/uncomplicated  EVALUATION COMPLEXITY: Low   GOALS: Goals reviewed with patient? Yes  SHORT TERM GOALS: Target date: 09/20/2021   Be independent in initial HEP as allowed by MD Baseline:  Goal status: Met   2.  Report > or = to 30% reduction in Lt shoulder/scapular pain with ADLs and self-care Baseline: 09/05/21 Goal status: MET  3.  Demonstrate> or = to 110 degrees of Lt shoulder flexion A/ROM to improve overhead reaching  Baseline: 115 (09/12/21) Goal status: met   4.  Improve DASH to < or = to 40  Baseline: 56 Goal status: INITIAL  LONG TERM GOALS: Target date: 10/18/2021  Be independent in advanced HEP Baseline:  Goal status: INITIAL  2.  Improve Lt shoulder A/ROM flexion to > or = to 135 degrees to improve overhead reaching  Baseline: 90 Goal status: INITIAL  3.  Improve DASH to < or = to 25 Baseline: 56 Goal status: INITIAL  4.  Report < or = to 3/10 Lt shoulder/scapular pain with use  Baseline:  Goal status: INITIAL  5.  Demonstrate Lt shoulder A/ROM IR to > or = to T10 to improve self-care Baseline: T8 (09/12/21) Goal status: Met      PLAN: PT FREQUENCY: 1-2x/week  PT DURATION: 8 weeks  PLANNED INTERVENTIONS: Therapeutic exercises, Therapeutic activity, Neuromuscular re-education, Patient/Family education, Joint mobilization, Dry Needling, Cryotherapy, Moist heat, scar mobilization, Taping, and Manual therapy  PLAN FOR NEXT SESSION: Manual, gentle flexibility, DN 1x/wk.  Give DASH, mobilize scapula and incisions    Sigurd Sos, PT 09/12/21 4:21 PM   [facility] [address], [place], [place] [address] Phone # [phone] Fax [phone]

## 2021-09-13 ENCOUNTER — Inpatient Hospital Stay: Payer: BC Managed Care – PPO | Attending: Adult Health | Admitting: Physician Assistant

## 2021-09-13 ENCOUNTER — Other Ambulatory Visit: Payer: Self-pay

## 2021-09-13 VITALS — BP 128/75 | HR 63 | Temp 97.9°F | Resp 15 | Wt 178.2 lb

## 2021-09-13 DIAGNOSIS — Z9013 Acquired absence of bilateral breasts and nipples: Secondary | ICD-10-CM | POA: Insufficient documentation

## 2021-09-13 DIAGNOSIS — Z170 Estrogen receptor positive status [ER+]: Secondary | ICD-10-CM | POA: Insufficient documentation

## 2021-09-13 DIAGNOSIS — C50412 Malignant neoplasm of upper-outer quadrant of left female breast: Secondary | ICD-10-CM | POA: Diagnosis present

## 2021-09-13 DIAGNOSIS — K5900 Constipation, unspecified: Secondary | ICD-10-CM | POA: Insufficient documentation

## 2021-09-13 NOTE — Progress Notes (Signed)
CLINIC:  Survivorship   REASON FOR VISIT:  Routine follow-up post-treatment for a recent history of breast cancer.  BRIEF ONCOLOGIC HISTORY:  Oncology History  Malignant neoplasm of upper-outer quadrant of left breast in female, estrogen receptor positive ([place])  03/01/2021 Initial Diagnosis   Screening mammogram detected left breast mass posteriorly at the axillary tail measuring 1.5 cm: Biopsy: Grade 2 IDC ER 95%, PR 95%, HER2 negative, Ki-67 1%, left breast calcifications 0.8 cm biopsy: Benign, left breast asymmetry?  Axilla negative   03/09/2021 Cancer Staging   Staging form: Breast, AJCC 8th Edition - Clinical stage from 03/09/2021: Stage IA (cT1c, cN0, cM0, G2, ER+, PR+, HER2-) - Signed by Nicholas Lose, MD on 03/09/2021 Stage prefix: Initial diagnosis Histologic grading system: 3 grade system    Genetic Testing   [facility] CancerNext-Expanded Panel is Negative. Report date was 03/21/2021.  The CancerNext-Expanded gene panel offered by [facility] and includes sequencing, rearrangement, and RNA analysis for the following 77 genes: AIP, ALK, APC, ATM, AXIN2, BAP1, BARD1, BLM, BMPR1A, BRCA1, BRCA2, BRIP1, CDC73, CDH1, CDK4, CDKN1B, CDKN2A, CHEK2, CTNNA1, DICER1, FANCC, FH, FLCN, GALNT12, KIF1B, LZTR1, MAX, MEN1, MET, MLH1, MSH2, MSH3, MSH6, MUTYH, NBN, NF1, NF2, NTHL1, PALB2, PHOX2B, PMS2, POT1, PRKAR1A, PTCH1, PTEN, RAD51C, RAD51D, RB1, RECQL, RET, SDHA, SDHAF2, SDHB, SDHC, SDHD, SMAD4, SMARCA4, SMARCB1, SMARCE1, STK11, SUFU, TMEM127, TP53, TSC1, TSC2, VHL and XRCC2 (sequencing and deletion/duplication); EGFR, EGLN1, HOXB13, KIT, MITF, PDGFRA, POLD1, and POLE (sequencing only); EPCAM and GREM1 (deletion/duplication only).    05/12/2021 Surgery   05/12/2021: Right retroareolar biopsy: Negative, 0/3 sentinel lymph nodes negative Right mastectomy: Benign: UDH Left mastectomy: Grade 2 IDC 1.9 cm margins negative, ER 95%, PR 95%, HER2 negative, Ki-67 1%     INTERVAL HISTORY:  Haley Evans  presents to the [facility] today for our initial meeting to review her survivorship care plan detailing her treatment course for breast cancer, as well as monitoring long-term side effects of that treatment, education regarding health maintenance, screening, and overall wellness and health promotion.     Overall, Haley Evans reports she is recovering from  her mastectomy and reconstruction. She has struggled with post-op pain and difficulty with upper extremity mobility, predominantly in the left side. She is undergoing physical therapy and dry needling twice a week with improvement of pain and range of motion. Her energy levels are slowly improving and she is able to complete her ADLs on her own. She tries to eat healthy but struggles with weight gain. She admits that she is not as active as she used to be after undergoing treatment. She denies nausea, vomiting or abdominal pain. She has constipation with a bowel movement every 2-3 days. She currently takes milk of magnesia and fiber daily with minimal improvement. She denies easy bruising or signs of active bleeding. She denies fevers, chills, sweats, shortness of breath, chest pain or cough. She has no other complaints. Rest of the 10 point ROS is below.     REVIEW OF SYSTEMS:  Review of Systems  Constitutional:  Positive for fatigue.  HENT:  Negative.    Respiratory:  Negative for chest tightness, cough, hemoptysis and shortness of breath.   Cardiovascular:  Negative for chest pain, leg swelling and palpitations.  Gastrointestinal:  Positive for constipation. Negative for abdominal distention, abdominal pain, blood in stool, diarrhea and nausea.  Genitourinary: Negative.    Musculoskeletal:  Negative for arthralgias.  Skin:  Negative for itching and rash.  Neurological:  Negative for headaches, light-headedness and  numbness.  Hematological:  Negative for adenopathy.  Psychiatric/Behavioral: Negative.    Breast: Denies any new  nodularity, masses, tenderness or discharge.  ONCOLOGY TREATMENT TEAM:  1. Surgeon:  Dr. Autumn Messing at [facility] 2. Medical Oncologist: Dr. Nicholas Lose   PAST MEDICAL/SURGICAL HISTORY:  Past Medical History:  Diagnosis Date   Anemia    Anxiety    Bipolar 2 disorder ([place])    Cancer ([place])    breast   Depression    Family history of adverse reaction to anesthesia    aunt woke up during hip replacement   Seizure ([place])    vs syncopy, age 50 and 12/2017, Seen by neurologist, a definitive diagnosis was not made. Pt reports having seizure aroudn 2021   Past Surgical History:  Procedure Laterality Date   ARM WOUND REPAIR / CLOSURE Right    fractured arm   BREAST RECONSTRUCTION WITH PLACEMENT OF TISSUE EXPANDER AND FLEX HD (ACELLULAR HYDRATED DERMIS) Left 06/15/2021   Procedure: REPLACEMENT OF TISSUE EXPANDER AND FLEX HD (ACELLULAR HYDRATED DERMIS);  Surgeon: Wallace Going, DO;  Location: [place];  Service: Plastics;  Laterality: Left;   CESAREAN SECTION     DEBRIDEMENT AND CLOSURE WOUND Left 06/15/2021   Procedure: DEBRIDEMENT AND CLOSURE LEFT BREAST WOUND;  Surgeon: Wallace Going, DO;  Location: [place];  Service: Plastics;  Laterality: Left;  1 hour   LATISSIMUS FLAP TO BREAST Left 07/18/2021   Procedure: LATISSIMUS FLAP TO BREAST;  Surgeon: Wallace Going, DO;  Location: [place];  Service: Plastics;  Laterality: Left;   NIPPLE SPARING MASTECTOMY Right 05/12/2021   Procedure: RIGHT NIPPLE SPARING MASTECTOMY;  Surgeon: Jovita Kussmaul, MD;  Location: [place];  Service: General;  Laterality: Right;   NIPPLE SPARING MASTECTOMY WITH SENTINEL LYMPH NODE BIOPSY Left 05/12/2021   Procedure: LEFT NIPPLE SPARING MASTECTOMY WITH LEFT SENTINEL LYMPH NODE BIOPSY;  Surgeon: Jovita Kussmaul, MD;  Location: [place];  Service: General;  Laterality: Left;   PRE-PECTORAL BREAST RECON W/ PLACEMENT OF TISSUE EXPANDERS, DERMAL GRAFT Bilateral 05/12/2021    Procedure: IMMEDIATE BILATERAL BREAST RECONSTRUCTION WITH PLACEMENT OF TISSUE EXPANDERS AND FLEX HD;  Surgeon: Wallace Going, DO;  Location: [place];  Service: Plastics;  Laterality: Bilateral;   TISSUE EXPANDER PLACEMENT Left 07/18/2021   Procedure: REMOVAL AND REPLACEMENT OF TISSUE EXPANDER;  Surgeon: Wallace Going, DO;  Location: [place];  Service: Plastics;  Laterality: Left;  3 hours     ALLERGIES:  Allergies  Allergen Reactions   Morphine Itching     CURRENT MEDICATIONS:  Outpatient Encounter Medications as of 09/13/2021  Medication Sig Note   acetaminophen (TYLENOL) 500 MG tablet Take 1,000 mg by mouth every 6 (six) hours as needed for moderate pain.    ALPRAZolam (XANAX) 0.5 MG tablet TAKE 1 TABLET BY MOUTH TWICE DAILY AS NEEDED FOR ANXIETY    aspirin EC 81 MG tablet Take 81 mg by mouth daily. Swallow whole.    B Complex-C (SUPER B COMPLEX PO) Take 1 tablet by mouth daily.    Bioflavonoid Products (SUPER C-500 COMPLEX) TBCR Take 1 tablet by mouth daily.    BIOTIN PO Take by mouth daily at 2 am.    gabapentin (NEURONTIN) 100 MG capsule Take 100 mg by mouth 3 (three) times daily as needed for pain.    hydrOXYzine (ATARAX) 50 MG tablet Take 50 mg by mouth at bedtime.    ketorolac (TORADOL) 10 MG  tablet Take 1 tablet (10 mg total) by mouth every 6 (six) hours as needed.    meloxicam (MOBIC) 7.5 MG tablet Take 1 tablet (7.5 mg total) by mouth 2 (two) times daily as needed for pain.    Misc Natural Products (YUMVS BEET ROOT-TART CHERRY PO) Take by mouth daily. Beet root supplement    Multiple Vitamin (MULTIVITAMIN WITH MINERALS) TABS tablet Take 1 tablet by mouth daily. 07/14/2021: Takes as needed   Omega-3 Fatty Acids (FISH OIL PO) Take by mouth daily at 2 am.    TURMERIC PO Take 1 capsule by mouth daily.    varenicline (CHANTIX) 1 MG tablet Take 1 mg by mouth daily.    VITAMIN E PO Take 1 capsule by mouth daily.    No facility-administered encounter  medications on file as of 09/13/2021.     ONCOLOGIC FAMILY HISTORY:  Family History  Problem Relation Age of Onset   Bladder Cancer Maternal Grandmother 94   Pancreatic cancer Maternal Grandfather 3     GENETIC COUNSELING/TESTING:   [facility] CancerNext-Expanded Panel is Negative. Report date was 03/21/2021.   The CancerNext-Expanded gene panel offered by [facility] and includes sequencing, rearrangement, and RNA analysis for the following 77 genes: AIP, ALK, APC, ATM, AXIN2, BAP1, BARD1, BLM, BMPR1A, BRCA1, BRCA2, BRIP1, CDC73, CDH1, CDK4, CDKN1B, CDKN2A, CHEK2, CTNNA1, DICER1, FANCC, FH, FLCN, GALNT12, KIF1B, LZTR1, MAX, MEN1, MET, MLH1, MSH2, MSH3, MSH6, MUTYH, NBN, NF1, NF2, NTHL1, PALB2, PHOX2B, PMS2, POT1, PRKAR1A, PTCH1, PTEN, RAD51C, RAD51D, RB1, RECQL, RET, SDHA, SDHAF2, SDHB, SDHC, SDHD, SMAD4, SMARCA4, SMARCB1, SMARCE1, STK11, SUFU, TMEM127, TP53, TSC1, TSC2, VHL and XRCC2 (sequencing and deletion/duplication); EGFR, EGLN1, HOXB13, KIT, MITF, PDGFRA, POLD1, and POLE (sequencing only); EPCAM and GREM1 (deletion/duplication only).     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  Tobacco Use   Smoking status: Former    Types: Cigarettes    Quit date: 04/12/2021    Years since quitting: 0.4   Smokeless tobacco: Never  Vaping Use   Vaping Use: Never used  Substance and Sexual Activity   Alcohol use: Yes    Alcohol/week: 5.0 standard drinks of alcohol    Types: 1 Glasses of wine, 4 Cans of beer per week    Comment: weekly   Drug use: No   Sexual activity: Not on file  Other Topics Concern   Not on file  Social History Narrative   Not on file   Social Determinants of Health   Financial Resource Strain: Not on file   Food Insecurity: Not on file  Transportation Needs: Not on file  Physical Activity: Not on file  Stress: Not on file  Social Connections: Not on file    PHYSICAL EXAMINATION:  Vital Signs:   Vitals:   09/13/21 1504  BP: 128/75  Pulse: 63  Resp: 15  Temp: 97.9 F (36.6 C)  SpO2: 100%   Filed Weights   09/13/21 1504  Weight: 178 lb 3.2 oz (80.8 kg)   General: Well-nourished, well-appearing female in no acute distress.  HEENT: Head is normocephalic.  Pupils equal and reactive to light. Conjunctivae clear without exudate.  Sclerae anicteric.  Cardiovascular: Regular rate and rhythm.Marland Kitchen Respiratory: Clear to  auscultation bilaterally. Chest expansion symmetric; breathing non-labored.  Neuro: No focal deficits. Steady gait.  Psych: Mood and affect normal and appropriate for situation.  Extremities: No edema. Skin: Warm and dry.  LABORATORY DATA:  None for this visit.  DIAGNOSTIC IMAGING:  None for this visit.    ASSESSMENT AND PLAN:  Ms.. Evans is a pleasant 41 y.o. female with Stage IA left breast invasive ductal carcinoma, ER+/PR+/HER2-, diagnosed on 03/07/2021.  She presents to the Survivorship Clinic for our initial meeting and routine follow-up post-completion of treatment for breast cancer.    1. Stage IA left breast cancer:  Haley Evans is continuing to recover from definitive treatment for breast cancer. She will follow-up with her medical oncologist, Dr. Lindi Adie in 6 months with history and physical exam per surveillance protocol. Haley Evans discontinued her Tamoxifen after one month in March 2023 due to persistent headaches.   Today, a comprehensive survivorship care plan and treatment summary was reviewed with the patient today detailing her breast cancer diagnosis, treatment course, potential late/long-term effects of treatment, appropriate follow-up care with recommendations for the future, and patient education resources.  A copy of this summary, along with a  letter will be sent to the patient's primary care provider via mail/fax/In Basket message after today's visit.  #Constipation: Recommended to start OTC Senna two tablets daily and add miralax if needed. Continue to stay hydrated.   #. Bone health:  Given Haley Evans's age/history of breast cancer, she is at risk for bone demineralization.  She was encouraged to increase her consumption of foods rich in calcium, as well as increase her weight-bearing activities.  She was given education on specific activities to promote bone health.  #. Cancer screening:  Due to Haley Evans's history and her age, she should receive screening for skin cancers, and gynecologic cancers.  When she is 41 years old, she should begun screening for colon cancers. The information and recommendations are listed on the patient's comprehensive care plan/treatment summary and were reviewed in detail with the patient.    #. Health maintenance and wellness promotion: Haley Evans was encouraged to consume 5-7 servings of fruits and vegetables per day. We reviewed the "Nutrition Rainbow" handout, as well as the handout "Take Control of Your Health and Reduce Your Cancer Risk" from the [place].  She was also encouraged to engage in moderate to vigorous exercise for 30 minutes per day most days of the week. We discussed the [employer] fitness program, which is designed for cancer survivors to help them become more physically fit after cancer treatments.  She was instructed to limit her alcohol consumption and continue to abstain from tobacco use.    #. Support services/counseling: It is not uncommon for this period of the patient's cancer care trajectory to be one of many emotions and stressors.  We discussed an opportunity for her to participate in the next session of [facility] ("Finding Your New Normal") support group series designed for patients after they have completed treatment.   Haley Evans was encouraged to  take advantage of our many other support services programs, support groups, and/or counseling in coping with her new life as a cancer survivor after completing anti-cancer treatment.  She was offered support today through active listening and expressive supportive counseling.  She was given information regarding our available services and encouraged to contact me with any questions or for help enrolling in any of our support group/programs.    Dispo:   -Return to cancer  center in 6 months for a surveillance visit with Dr. Lindi Adie -Follow up with surgery in August 2023.  -She is welcome to return back to the Survivorship Clinic at any time; no additional follow-up needed at this time.  -Consider referral back to survivorship as a long-term survivor for continued surveillance  I have spent a total of 30 minutes minutes of face-to-face and non-face-to-face time, preparing to see the patient, obtaining and/or reviewing separately obtained history, performing a medically appropriate examination, counseling and educating the patient, documenting clinical information in the electronic health record, and care coordination.    [place] [phone]   Note: PRIMARY CARE PROVIDER Berkley Harvey, [place] [phone]

## 2021-09-14 ENCOUNTER — Ambulatory Visit: Payer: BC Managed Care – PPO

## 2021-09-14 DIAGNOSIS — R293 Abnormal posture: Secondary | ICD-10-CM

## 2021-09-14 DIAGNOSIS — M25612 Stiffness of left shoulder, not elsewhere classified: Secondary | ICD-10-CM

## 2021-09-14 DIAGNOSIS — Z483 Aftercare following surgery for neoplasm: Secondary | ICD-10-CM | POA: Diagnosis not present

## 2021-09-14 DIAGNOSIS — R252 Cramp and spasm: Secondary | ICD-10-CM

## 2021-09-14 NOTE — Therapy (Signed)
OUTPATIENT PHYSICAL THERAPY TREATMENT   Patient Name: Haley Evans MRN: [id number] DOB:05/05/1980, 41 y.o., female Today's Date: 09/14/2021   PT End of Session - 09/14/21 1618     Visit Number 4    Date for PT Re-Evaluation 10/18/21    Authorization Type [employer]    PT Start Time 1532    PT Stop Time 1615    PT Time Calculation (min) 43 min    Activity Tolerance Patient tolerated treatment well    Behavior During Therapy WFL for tasks assessed/performed                Past Medical History:  Diagnosis Date   Anemia    Anxiety    Bipolar 2 disorder ([place])    Cancer ([place])    breast   Depression    Family history of adverse reaction to anesthesia    aunt woke up during hip replacement   Seizure ([place])    vs syncopy, age 66 and 12/2017, Seen by neurologist, a definitive diagnosis was not made. Pt reports having seizure aroudn 2021   Past Surgical History:  Procedure Laterality Date   ARM WOUND REPAIR / CLOSURE Right    fractured arm   BREAST RECONSTRUCTION WITH PLACEMENT OF TISSUE EXPANDER AND FLEX HD (ACELLULAR HYDRATED DERMIS) Left 06/15/2021   Procedure: REPLACEMENT OF TISSUE EXPANDER AND FLEX HD (ACELLULAR HYDRATED DERMIS);  Surgeon: Wallace Going, DO;  Location: [facility];  Service: Plastics;  Laterality: Left;   CESAREAN SECTION     DEBRIDEMENT AND CLOSURE WOUND Left 06/15/2021   Procedure: DEBRIDEMENT AND CLOSURE LEFT BREAST WOUND;  Surgeon: Wallace Going, DO;  Location: [facility];  Service: Plastics;  Laterality: Left;  1 hour   LATISSIMUS FLAP TO BREAST Left 07/18/2021   Procedure: LATISSIMUS FLAP TO BREAST;  Surgeon: Wallace Going, DO;  Location: [facility];  Service: Plastics;  Laterality: Left;   NIPPLE SPARING MASTECTOMY Right 05/12/2021   Procedure: RIGHT NIPPLE SPARING MASTECTOMY;  Surgeon: Jovita Kussmaul, MD;  Location: [facility];  Service: General;  Laterality: Right;   NIPPLE SPARING MASTECTOMY WITH SENTINEL LYMPH NODE BIOPSY Left  05/12/2021   Procedure: LEFT NIPPLE SPARING MASTECTOMY WITH LEFT SENTINEL LYMPH NODE BIOPSY;  Surgeon: Jovita Kussmaul, MD;  Location: [facility];  Service: General;  Laterality: Left;   PRE-PECTORAL BREAST RECON W/ PLACEMENT OF TISSUE EXPANDERS, DERMAL GRAFT Bilateral 05/12/2021   Procedure: IMMEDIATE BILATERAL BREAST RECONSTRUCTION WITH PLACEMENT OF TISSUE EXPANDERS AND FLEX HD;  Surgeon: Wallace Going, DO;  Location: [facility];  Service: Plastics;  Laterality: Bilateral;   TISSUE EXPANDER PLACEMENT Left 07/18/2021   Procedure: REMOVAL AND REPLACEMENT OF TISSUE EXPANDER;  Surgeon: Wallace Going, DO;  Location: [facility];  Service: Plastics;  Laterality: Left;  3 hours   Patient Active Problem List   Diagnosis Date Noted   Admission for removal of tissue expander of breast, unspecified laterality 07/18/2021   Acquired absence of left breast 07/18/2021   Open wound of left breast 07/15/2021   Breast cancer ([place]) 05/12/2021   Genetic testing 03/18/2021   Family history of pancreatic cancer 03/10/2021   Malignant neoplasm of upper-outer quadrant of left breast in female, estrogen receptor positive ([place]) 03/07/2021   Anxiety 06/25/2011    PCP: Eldridge Abrahams, NP  REFERRING PROVIDER: Nicholas Lose, MD  REFERRING DIAG: [phone] (ICD-10-CM) - Malignant neoplasm of upper-outer quadrant of left breast in female, estrogen receptor positive ([place]  THERAPY DIAG:  Aftercare following surgery for neoplasm  Abnormal posture  Cramp and spasm  Stiffness of left shoulder, not elsewhere classified  Rationale for Evaluation and Treatment Rehabilitation  ONSET DATE: shoulder pain began after surgery last month  SUBJECTIVE:                                                                                                                                                                                                         SUBJECTIVE STATEMENT: Lt shoulder  blade feels tight again.  I have been stretching my shoulder at home.  I have been using my Lt arm more with home tasks.    PERTINENT HISTORY:  Patient was diagnosed on 01/12/2021 with left grade II invasive ductal carcinoma breast cancer. She underwent a bilateral nipple sparing mastectomy and left sentinel node biopsy (3 negative nodes) on 05/12/2021 with expanders placed for reconstruction. It is ER/PR positive and HER2 negative with a Ki67 of 1%. Muscle flap reconstruction performed 07/19/21 due to complications.   PAIN:  Are you having pain? Yes: NPRS scale: max 6-7/10 Pain location: Lt shoulder and scapular region  Pain description: aching, numbness Aggravating factors: sleep at night,  Relieving factors: pain meds  PRECAUTIONS: Other: s/p mastectomy with complications  WEIGHT BEARING RESTRICTIONS none   FALLS:  Has patient fallen in last 6 months? No  LIVING ENVIRONMENT: Lives with: lives with their family Lives in: House/apartment   OCCUPATION: Pt is on FMLA  PLOF: Independent  PATIENT GOALS reduce Lt UE pain, return to prior level of function  OBJECTIVE:   DIAGNOSTIC FINDINGS:  NA  PATIENT SURVEYS:  Quick Dash 56   COGNITION: Overall cognitive status: Within functional limits for tasks assessed   SENSATION: WFL  POSTURE: rounded shoulders  PALPATION: Trigger points in bil rhomboids, subscapularis and upper points on the Lt, reduced scapular mobility.  Healing surgical incisions in Lt lats, and distal to Lt breast.  Edema distal/lateral portion of Lt breast.     CERVICAL ROM:   WFLS with Lt UT pain with Rt sidebending UPPER EXTREMITY ROM:  Active ROM Right eval Left eval Left 09/05/21 Left 09/12/21  Shoulder flexion  90 110 P/ROM 115 active  Shoulder extension      Shoulder abduction  70  100 active   Shoulder adduction      Shoulder extension      Shoulder internal rotation  To Lt gluteals To L1 (active) T8 (active   Shoulder external rotation       Elbow flexion      Elbow extension  Wrist flexion      Wrist extension      Wrist ulnar deviation      Wrist radial deviation      Wrist pronation      Wrist supination       (Blank rows = not tested)  UPPER EXTREMITY MMT: Tested in neutral: flexion 4-/5, ext 4/5, IR 4/5, ER 4-/5    TODAY'S TREATMENT:  Date: 09/14/21 Finger ladder: Lt x12 Supine cane flexion x 10 Yellow theraband: horizontal abduction and ER x10 each Cat/cow: x5 Manual: scapular mobs, scar mobs and elongation to L scapular musculature   09/12/21:  Overhead pulleys: flexion; x 3 minutes  Trigger Point Dry-Needling  Treatment instructions: Expect mild to moderate muscle soreness. S/S of pneumothorax if dry needled over a lung field, and to seek immediate medical attention should they occur. Patient verbalized understanding of these instructions and education.  Patient Consent Given: Yes Education handout provided: Yes Muscles treated: Lt rhomboids, subscapularis, upper traps, Lt lats along rib cage  Treatment response/outcome: Utilized skilled palpation to identify trigger points.  During dry needling able to palpate muscle twitch and muscle elongation   Skilled palpation and monitoring by PT during dry needling  Elongation and release to Lt scapula after DN Manual: P/ROM to Lt shoulder into flexion, IR/ER to pt tolerance Scar massage to lat scar and PT educated pt on how to mobilize at home  09/05/21:  Trigger Point Dry-Needling  Treatment instructions: Expect mild to moderate muscle soreness. S/S of pneumothorax if dry needled over a lung field, and to seek immediate medical attention should they occur. Patient verbalized understanding of these instructions and education.  Patient Consent Given: Yes Education handout provided: Yes Muscles treated: Lt rhomboids, subscapularis, upper traps, Lt lats along rib cage  Treatment response/outcome: Utilized skilled palpation to identify trigger points.   During dry needling able to palpate muscle twitch and muscle elongation   Skilled palpation and monitoring by PT during dry needling  Elongation and release to Lt scapula after DN Manual: P/ROM to Lt shoulder into flexion, IR/ER to pt tolerance  08/23/21:  Trigger Point Dry-Needling  Treatment instructions: Expect mild to moderate muscle soreness. S/S of pneumothorax if dry needled over a lung field, and to seek immediate medical attention should they occur. Patient verbalized understanding of these instructions and education.  Patient Consent Given: Yes Education handout provided: Yes Muscles treated: Lt rhomboids, subscapularis, upper traps  Treatment response/outcome: Utilized skilled palpation to identify trigger points.  During dry needling able to palpate muscle twitch and muscle elongation   Skilled palpation and monitoring by PT during dry needling  Elongation and release to Lt scapula after DN   PATIENT EDUCATION:  DN info Access Code: [email or web address] URL: [email or web address] Date: 09/14/2021 Prepared by: Claiborne Billings  Exercises - Supine Shoulder Horizontal Abduction with Resistance  - 2 x daily - 7 x weekly - 2 sets - 10 reps - Supine Bilateral Shoulder External Rotation with Resistance  - 2 x daily - 7 x weekly - 2 sets - 10 reps - Cat Cow  - 2 x daily - 7 x weekly - 1 sets - 10 reps   HOME EXERCISE PROGRAM: NA- pt has HEP issued in prior PT and PT is checking with MD to determine when she will be allowed to resume.   ASSESSMENT:  CLINICAL IMPRESSION: Pt with variable pain in the Lt scapula overall.  Pt has improved Lt shoulder ROM overall and has improved scar and scapular mobility. Pt  did well with supine, gentle scapular strength and will do this at home.  Pt with reduced thoracic mobility and was challenged with cat/cow.   Patient will benefit from skilled PT to address the below impairments and improve overall function.    OBJECTIVE IMPAIRMENTS decreased ROM,  decreased strength, increased edema, increased fascial restrictions, increased muscle spasms, postural dysfunction, and pain.   ACTIVITY LIMITATIONS bathing, dressing, reach over head, and hygiene/grooming  PARTICIPATION LIMITATIONS: meal prep, cleaning, driving, and community activity  PERSONAL FACTORS 1 comorbidity: breast surgery with complications   are also affecting patient's functional outcome.   REHAB POTENTIAL: Good  CLINICAL DECISION MAKING: Stable/uncomplicated  EVALUATION COMPLEXITY: Low   GOALS: Goals reviewed with patient? Yes  SHORT TERM GOALS: Target date: 09/20/2021   Be independent in initial HEP as allowed by MD Baseline:  Goal status: Met   2.  Report > or = to 30% reduction in Lt shoulder/scapular pain with ADLs and self-care Baseline: 09/05/21 Goal status: MET  3.  Demonstrate> or = to 110 degrees of Lt shoulder flexion A/ROM to improve overhead reaching  Baseline: 115 (09/12/21) Goal status: met   4.  Improve DASH to < or = to 40  Baseline: 56 Goal status: INITIAL  LONG TERM GOALS: Target date: 10/18/2021  Be independent in advanced HEP Baseline:  Goal status: INITIAL  2.  Improve Lt shoulder A/ROM flexion to > or = to 135 degrees to improve overhead reaching  Baseline: 90 Goal status: INITIAL  3.  Improve DASH to < or = to 25 Baseline: 56 Goal status: INITIAL  4.  Report < or = to 3/10 Lt shoulder/scapular pain with use  Baseline:  Goal status: INITIAL  5.  Demonstrate Lt shoulder A/ROM IR to > or = to T10 to improve self-care Baseline: T8 (09/12/21) Goal status: Met      PLAN: PT FREQUENCY: 1-2x/week  PT DURATION: 8 weeks  PLANNED INTERVENTIONS: Therapeutic exercises, Therapeutic activity, Neuromuscular re-education, Patient/Family education, Joint mobilization, Dry Needling, Cryotherapy, Moist heat, scar mobilization, Taping, and Manual therapy  PLAN FOR NEXT SESSION: Manual, gentle flexibility, DN 1x/wk.  Give DASH, mobilize  scapula and incisions.  Work on thoracic mobility.    Sigurd Sos, PT 09/14/21 4:21 PM   [facility] [address] [place], [place] [address] Phone # [phone] Fax [phone]

## 2021-09-16 ENCOUNTER — Ambulatory Visit (INDEPENDENT_AMBULATORY_CARE_PROVIDER_SITE_OTHER): Payer: BC Managed Care – PPO | Admitting: Student

## 2021-09-16 DIAGNOSIS — Z9889 Other specified postprocedural states: Secondary | ICD-10-CM

## 2021-09-16 MED ORDER — DOXYCYCLINE HYCLATE 100 MG PO TABS: 100.0000 mg | ORAL_TABLET | Freq: Two times a day (BID) | ORAL | 0 refills | Status: AC

## 2021-09-16 MED ORDER — CYCLOBENZAPRINE HCL 10 MG PO TABS: 10.0000 mg | ORAL_TABLET | Freq: Two times a day (BID) | ORAL | 0 refills | Status: AC | PRN

## 2021-09-16 NOTE — Progress Notes (Signed)
Patient is a 41 year old female with history of breast cancer.  She underwent breast reconstruction with tissue expander placement with flex HD placement on 05/12/2021 with Dr. Marla Roe.  Patient has had difficulties with wound healing over the left breast and underwent a left breast reconstruction with latissimus flap and tissue expander placement on 07/18/2021 with Dr. Marla Roe.  Patient was last seen in the clinic on 09/02/2021.  At this visit, she was doing really well.  She had reported the dry needling with physical therapy had been helping her pain tremendously.  On exam, her incisions were healing well.  Skin paddle was then intact and viable.  30 cc were placed in each expander, for a total of 410 cc / 455 cc on the right side and a total of 180 cc / 350 cc on the left.  Today, patient reports she is doing really well.  She states that her back pain continues to improve with the dry needling from physical therapy.  Patient does report she has some pain to her left breast and arm from time to time.  Patient reports the Flexeril typically helps with this.  Patient denies any other issues at this time.  She denies any issues at the surgical site.  Chaperone present on exam.  On exam, patient is sitting upright in no acute distress.  The back lateral incision is intact and healing well.  Expanders are in place bilaterally.  The left breast incision is intact and healing well.  Skin paddle is viable.  Mild erythema and swelling along the medial aspect of the left breast.  The area of erythema is blanchable.  There is no cellulitic changes to the skin.  The skin to the right breast is slightly taut laterally.  We placed injectable saline in the Expander using a sterile technique: Right: 30 cc for a total of 440 cc / 455 cc Left: 0 cc for a total of 180 cc / 350 cc   Discussed with the patient that we will hold off from expanding the left breast expander today given the mild erythema that is  present.  Discussed with the patient that she may continue physical therapy as it seems to be helping with her pain.  I discussed with the patient also that I will refill her Flexeril as well.  Discussed with the patient that we are also going to prescribe her an antibiotic given the area of redness.  I discussed with the patient that if this area were to get more red, more painful, if she were develop fevers or chills, or if she has any worsening symptoms or if she has any concerns that she should call us.  Patient expressed understanding.  Pictures were obtained of the patient and placed in the patient's chart with the patient's permission.  Patient to follow-up in 1 week.  Dr. Marla Roe also examined the patient.

## 2021-09-19 ENCOUNTER — Ambulatory Visit: Payer: BC Managed Care – PPO

## 2021-09-19 ENCOUNTER — Encounter: Payer: Self-pay | Admitting: Hematology and Oncology

## 2021-09-19 DIAGNOSIS — Z483 Aftercare following surgery for neoplasm: Secondary | ICD-10-CM | POA: Diagnosis not present

## 2021-09-19 DIAGNOSIS — R252 Cramp and spasm: Secondary | ICD-10-CM

## 2021-09-19 DIAGNOSIS — R293 Abnormal posture: Secondary | ICD-10-CM

## 2021-09-19 DIAGNOSIS — M25612 Stiffness of left shoulder, not elsewhere classified: Secondary | ICD-10-CM

## 2021-09-19 NOTE — Therapy (Signed)
OUTPATIENT PHYSICAL THERAPY TREATMENT   Patient Name: Haley Evans MRN: [id number] DOB:Nov 21, 1980, 41 y.o., female Today's Date: 09/19/2021   PT End of Session - 09/19/21 1621     Visit Number 5    Date for PT Re-Evaluation 10/18/21    Authorization Type BCBS    PT Start Time 1532    PT Stop Time 1612    PT Time Calculation (min) 40 min    Activity Tolerance Patient tolerated treatment well    Behavior During Therapy WFL for tasks assessed/performed                 Past Medical History:  Diagnosis Date   Anemia    Anxiety    Bipolar 2 disorder ([place])    Cancer ([place])    breast   Depression    Family history of adverse reaction to anesthesia    aunt woke up during hip replacement   Seizure ([place])    vs syncopy, age 30 and 12/2017, Seen by neurologist, a definitive diagnosis was not made. Pt reports having seizure aroudn 2021   Past Surgical History:  Procedure Laterality Date   ARM WOUND REPAIR / CLOSURE Right    fractured arm   BREAST RECONSTRUCTION WITH PLACEMENT OF TISSUE EXPANDER AND FLEX HD (ACELLULAR HYDRATED DERMIS) Left 06/15/2021   Procedure: REPLACEMENT OF TISSUE EXPANDER AND FLEX HD (ACELLULAR HYDRATED DERMIS);  Surgeon: Wallace Going, DO;  Location: [facility];  Service: Plastics;  Laterality: Left;   CESAREAN SECTION     DEBRIDEMENT AND CLOSURE WOUND Left 06/15/2021   Procedure: DEBRIDEMENT AND CLOSURE LEFT BREAST WOUND;  Surgeon: Wallace Going, DO;  Location: [facility];  Service: Plastics;  Laterality: Left;  1 hour   LATISSIMUS FLAP TO BREAST Left 07/18/2021   Procedure: LATISSIMUS FLAP TO BREAST;  Surgeon: Wallace Going, DO;  Location: [facility];  Service: Plastics;  Laterality: Left;   NIPPLE SPARING MASTECTOMY Right 05/12/2021   Procedure: RIGHT NIPPLE SPARING MASTECTOMY;  Surgeon: Jovita Kussmaul, MD;  Location: [place] [facility];  Service: General;  Laterality: Right;   NIPPLE SPARING MASTECTOMY WITH SENTINEL LYMPH NODE BIOPSY Left  05/12/2021   Procedure: LEFT NIPPLE SPARING MASTECTOMY WITH LEFT SENTINEL LYMPH NODE BIOPSY;  Surgeon: Jovita Kussmaul, MD;  Location: [facility];  Service: General;  Laterality: Left;   PRE-PECTORAL BREAST RECON W/ PLACEMENT OF TISSUE EXPANDERS, DERMAL GRAFT Bilateral 05/12/2021   Procedure: IMMEDIATE BILATERAL BREAST RECONSTRUCTION WITH PLACEMENT OF TISSUE EXPANDERS AND FLEX HD;  Surgeon: Wallace Going, DO;  Location: [facility];  Service: Plastics;  Laterality: Bilateral;   TISSUE EXPANDER PLACEMENT Left 07/18/2021   Procedure: REMOVAL AND REPLACEMENT OF TISSUE EXPANDER;  Surgeon: Wallace Going, DO;  Location: [facility];  Service: Plastics;  Laterality: Left;  3 hours   Patient Active Problem List   Diagnosis Date Noted   Admission for removal of tissue expander of breast, unspecified laterality 07/18/2021   Acquired absence of left breast 07/18/2021   Open wound of left breast 07/15/2021   Breast cancer ([place]) 05/12/2021   Genetic testing 03/18/2021   Family history of pancreatic cancer 03/10/2021   Malignant neoplasm of upper-outer quadrant of left breast in female, estrogen receptor positive ([place]) 03/07/2021   Anxiety 06/25/2011    PCP: Eldridge Abrahams, NP  REFERRING PROVIDER: Nicholas Lose, MD  REFERRING DIAG: [phone] (ICD-10-CM) - Malignant neoplasm of upper-outer quadrant of left breast in female, estrogen receptor positive ([place]  THERAPY  DIAG:  Aftercare following surgery for neoplasm  Abnormal posture  Cramp and spasm  Stiffness of left shoulder, not elsewhere classified  Rationale for Evaluation and Treatment Rehabilitation  ONSET DATE: shoulder pain began after surgery last month  SUBJECTIVE:                                                                                                                                                                                                         SUBJECTIVE STATEMENT: I saw the MD and  I am taking antibiotics due to Lt anterior incision is red.  I am 75% better regarding my shoulder blade pain. I feel tight in my triceps.   PERTINENT HISTORY:  Patient was diagnosed on 01/12/2021 with left grade II invasive ductal carcinoma breast cancer. She underwent a bilateral nipple sparing mastectomy and left sentinel node biopsy (3 negative nodes) on 05/12/2021 with expanders placed for reconstruction. It is ER/PR positive and HER2 negative with a Ki67 of 1%. Muscle flap reconstruction performed 03/18/39 due to complications.   PAIN:  Are you having pain? Yes: NPRS scale: today 3/10 max 6-7/10 Pain location: Lt shoulder and scapular region  Pain description: aching, numbness Aggravating factors: sleep at night,  Relieving factors: pain meds  PRECAUTIONS: Other: s/p mastectomy with complications  WEIGHT BEARING RESTRICTIONS none   FALLS:  Has patient fallen in last 6 months? No  LIVING ENVIRONMENT: Lives with: lives with their family Lives in: House/apartment   OCCUPATION: Pt is on FMLA  PLOF: Independent  PATIENT GOALS reduce Lt UE pain, return to prior level of function  OBJECTIVE:   DIAGNOSTIC FINDINGS:  NA  PATIENT SURVEYS:  Quick Dash 56   COGNITION: Overall cognitive status: Within functional limits for tasks assessed   SENSATION: WFL  POSTURE: rounded shoulders  PALPATION: Trigger points in bil rhomboids, subscapularis and upper points on the Lt, reduced scapular mobility.  Healing surgical incisions in Lt lats, and distal to Lt breast.  Edema distal/lateral portion of Lt breast.     CERVICAL ROM:   WFLS with Lt UT pain with Rt sidebending UPPER EXTREMITY ROM:  Active ROM Right eval Left eval Left 09/05/21 Left 09/12/21  Shoulder flexion  90 110 P/ROM 115 active  Shoulder extension      Shoulder abduction  70  100 active   Shoulder adduction      Shoulder extension      Shoulder internal rotation  To Lt gluteals To L1 (active) T8 (active    Shoulder external rotation      Elbow flexion  Elbow extension      Wrist flexion      Wrist extension      Wrist ulnar deviation      Wrist radial deviation      Wrist pronation      Wrist supination       (Blank rows = not tested)  UPPER EXTREMITY MMT: Tested in neutral: flexion 4-/5, ext 4/5, IR 4/5, ER 4-/5    TODAY'S TREATMENT:  Date: 09/19/21:  Trigger Point Dry-Needling  Treatment instructions: Expect mild to moderate muscle soreness. S/S of pneumothorax if dry needled over a lung field, and to seek immediate medical attention should they occur. Patient verbalized understanding of these instructions and education.  Patient Consent Given: Yes Education handout provided: Yes Muscles treated: Lt rhomboids, subscapularis, upper traps, Lt lats along rib cage, Lt triceps  Treatment response/outcome: Utilized skilled palpation to identify trigger points.  During dry needling able to palpate muscle twitch and muscle elongation   Skilled palpation and monitoring by PT during dry needling  Elongation and release to Lt scapula after DN Manual: P/ROM to Lt shoulder into flexion, IR/ER to pt tolerance Scar massage to lat scar and PT educated pt on how to mobilize at home  Date: 09/14/21 Finger ladder: Lt x12 Supine cane flexion x 10 Yellow theraband: horizontal abduction and ER x10 each Cat/cow: x5 Manual: scapular mobs, scar mobs and elongation to L scapular musculature   09/12/21:  Overhead pulleys: flexion; x 3 minutes  Trigger Point Dry-Needling  Treatment instructions: Expect mild to moderate muscle soreness. S/S of pneumothorax if dry needled over a lung field, and to seek immediate medical attention should they occur. Patient verbalized understanding of these instructions and education.  Patient Consent Given: Yes Education handout provided: Yes Muscles treated: Lt rhomboids, subscapularis, upper traps, Lt lats along rib cage  Treatment response/outcome: Utilized  skilled palpation to identify trigger points.  During dry needling able to palpate muscle twitch and muscle elongation   Skilled palpation and monitoring by PT during dry needling  Elongation and release to Lt scapula after DN Manual: P/ROM to Lt shoulder into flexion, IR/ER to pt tolerance Scar massage to lat scar and PT educated pt on how to mobilize at home     PATIENT EDUCATION:  DN info Access Code: [email or web address] URL: [email or web address] Date: 09/14/2021 Prepared by: Claiborne Billings  Exercises - Supine Shoulder Horizontal Abduction with Resistance  - 2 x daily - 7 x weekly - 2 sets - 10 reps - Supine Bilateral Shoulder External Rotation with Resistance  - 2 x daily - 7 x weekly - 2 sets - 10 reps - Cat Cow  - 2 x daily - 7 x weekly - 1 sets - 10 reps   HOME EXERCISE PROGRAM: Access Code: [email or web address]   ASSESSMENT:  CLINICAL IMPRESSION: Pt reports 75% overall reduction in scapular symptoms since the start of care.  Pt with tension and overall reduced trigger points in Lt scapular region and had good response to DN and manual therapy with reduced tension and improved tissue mobility in lats and triceps after treatment.  Patient will benefit from skilled PT to address the below impairments and improve overall function.    OBJECTIVE IMPAIRMENTS decreased ROM, decreased strength, increased edema, increased fascial restrictions, increased muscle spasms, postural dysfunction, and pain.   ACTIVITY LIMITATIONS bathing, dressing, reach over head, and hygiene/grooming  PARTICIPATION LIMITATIONS: meal prep, cleaning, driving, and community activity  PERSONAL FACTORS 1 comorbidity: breast surgery with complications   are  also affecting patient's functional outcome.   REHAB POTENTIAL: Good  CLINICAL DECISION MAKING: Stable/uncomplicated  EVALUATION COMPLEXITY: Low   GOALS: Goals reviewed with patient? Yes  SHORT TERM GOALS: Target date: 09/20/2021   Be independent in initial  HEP as allowed by MD Baseline:  Goal status: Met   2.  Report > or = to 30% reduction in Lt shoulder/scapular pain with ADLs and self-care Baseline: 09/05/21 Goal status: MET  3.  Demonstrate> or = to 110 degrees of Lt shoulder flexion A/ROM to improve overhead reaching  Baseline: 115 (09/12/21) Goal status: met   4.  Improve DASH to < or = to 40  Baseline: 56 Goal status: INITIAL  LONG TERM GOALS: Target date: 10/18/2021  Be independent in advanced HEP Baseline:  Goal status: INITIAL  2.  Improve Lt shoulder A/ROM flexion to > or = to 135 degrees to improve overhead reaching  Baseline: 90 Goal status: INITIAL  3.  Improve DASH to < or = to 25 Baseline: 56 Goal status: INITIAL  4.  Report < or = to 3/10 Lt shoulder/scapular pain with use  Baseline:  Goal status: INITIAL  5.  Demonstrate Lt shoulder A/ROM IR to > or = to T10 to improve self-care Baseline: T8 (09/12/21) Goal status: Met      PLAN: PT FREQUENCY: 1-2x/week  PT DURATION: 8 weeks  PLANNED INTERVENTIONS: Therapeutic exercises, Therapeutic activity, Neuromuscular re-education, Patient/Family education, Joint mobilization, Dry Needling, Cryotherapy, Moist heat, scar mobilization, Taping, and Manual therapy  PLAN FOR NEXT SESSION: Manual, gentle flexibility, DN 1x/wk.  Give DASH, mobilize scapula and incisions.  See what MD says regarding restrictions.  Sigurd Sos, PT 09/19/21 4:21 PM   [facility] [address], [place], [place] [address] Phone # [phone] Fax [phone]

## 2021-09-22 ENCOUNTER — Ambulatory Visit: Payer: BC Managed Care – PPO

## 2021-09-22 DIAGNOSIS — M25612 Stiffness of left shoulder, not elsewhere classified: Secondary | ICD-10-CM

## 2021-09-22 DIAGNOSIS — Z483 Aftercare following surgery for neoplasm: Secondary | ICD-10-CM | POA: Diagnosis not present

## 2021-09-22 DIAGNOSIS — R252 Cramp and spasm: Secondary | ICD-10-CM

## 2021-09-22 DIAGNOSIS — R293 Abnormal posture: Secondary | ICD-10-CM

## 2021-09-22 NOTE — Therapy (Signed)
OUTPATIENT PHYSICAL THERAPY TREATMENT   Patient Name: Haley Evans MRN: [id number] DOB:05/11/1980, 41 y.o., female Today's Date: 09/22/2021   PT End of Session - 09/22/21 1319     Visit Number 6    Date for PT Re-Evaluation 10/18/21    Authorization Type BCBS    PT Start Time 1232    PT Stop Time 1316    PT Time Calculation (min) 44 min    Activity Tolerance Patient tolerated treatment well    Behavior During Therapy WFL for tasks assessed/performed                  Past Medical History:  Diagnosis Date   Anemia    Anxiety    Bipolar 2 disorder ([place])    Cancer ([place])    breast   Depression    Family history of adverse reaction to anesthesia    aunt woke up during hip replacement   Seizure ([place])    vs syncopy, age 25 and 12/2017, Seen by neurologist, a definitive diagnosis was not made. Pt reports having seizure aroudn 2021   Past Surgical History:  Procedure Laterality Date   ARM WOUND REPAIR / CLOSURE Right    fractured arm   BREAST RECONSTRUCTION WITH PLACEMENT OF TISSUE EXPANDER AND FLEX HD (ACELLULAR HYDRATED DERMIS) Left 06/15/2021   Procedure: REPLACEMENT OF TISSUE EXPANDER AND FLEX HD (ACELLULAR HYDRATED DERMIS);  Surgeon: Wallace Going, DO;  Location: [facility];  Service: Plastics;  Laterality: Left;   CESAREAN SECTION     DEBRIDEMENT AND CLOSURE WOUND Left 06/15/2021   Procedure: DEBRIDEMENT AND CLOSURE LEFT BREAST WOUND;  Surgeon: Wallace Going, DO;  Location: [facility];  Service: Plastics;  Laterality: Left;  1 hour   LATISSIMUS FLAP TO BREAST Left 07/18/2021   Procedure: LATISSIMUS FLAP TO BREAST;  Surgeon: Wallace Going, DO;  Location: [facility];  Service: Plastics;  Laterality: Left;   NIPPLE SPARING MASTECTOMY Right 05/12/2021   Procedure: RIGHT NIPPLE SPARING MASTECTOMY;  Surgeon: Jovita Kussmaul, MD;  Location: [facility];  Service: General;  Laterality: Right;   NIPPLE SPARING MASTECTOMY WITH SENTINEL LYMPH NODE BIOPSY  Left 05/12/2021   Procedure: LEFT NIPPLE SPARING MASTECTOMY WITH LEFT SENTINEL LYMPH NODE BIOPSY;  Surgeon: Jovita Kussmaul, MD;  Location: [facility];  Service: General;  Laterality: Left;   PRE-PECTORAL BREAST RECON W/ PLACEMENT OF TISSUE EXPANDERS, DERMAL GRAFT Bilateral 05/12/2021   Procedure: IMMEDIATE BILATERAL BREAST RECONSTRUCTION WITH PLACEMENT OF TISSUE EXPANDERS AND FLEX HD;  Surgeon: Wallace Going, DO;  Location: [facility];  Service: Plastics;  Laterality: Bilateral;   TISSUE EXPANDER PLACEMENT Left 07/18/2021   Procedure: REMOVAL AND REPLACEMENT OF TISSUE EXPANDER;  Surgeon: Wallace Going, DO;  Location: [facility];  Service: Plastics;  Laterality: Left;  3 hours   Patient Active Problem List   Diagnosis Date Noted   Admission for removal of tissue expander of breast, unspecified laterality 07/18/2021   Acquired absence of left breast 07/18/2021   Open wound of left breast 07/15/2021   Breast cancer ([place]) 05/12/2021   Genetic testing 03/18/2021   Family history of pancreatic cancer 03/10/2021   Malignant neoplasm of upper-outer quadrant of left breast in female, estrogen receptor positive ([place]) 03/07/2021   Anxiety 06/25/2011    PCP: Eldridge Abrahams, NP  REFERRING PROVIDER: Nicholas Lose, MD  REFERRING DIAG: [phone] (ICD-10-CM) - Malignant neoplasm of upper-outer quadrant of left breast in female, estrogen receptor positive ([place]  THERAPY DIAG:  Aftercare following surgery for neoplasm  Abnormal posture  Cramp and spasm  Stiffness of left shoulder, not elsewhere classified  Rationale for Evaluation and Treatment Rehabilitation  ONSET DATE: shoulder pain began after surgery last month  SUBJECTIVE:                                                                                                                                                                                                         SUBJECTIVE STATEMENT: Doctor and  PA both said that pt does not have any restrictions.  I feel good today.   PERTINENT HISTORY:  Patient was diagnosed on 01/12/2021 with left grade II invasive ductal carcinoma breast cancer. She underwent a bilateral nipple sparing mastectomy and left sentinel node biopsy (3 negative nodes) on 05/12/2021 with expanders placed for reconstruction. It is ER/PR positive and HER2 negative with a Ki67 of 1%. Muscle flap reconstruction performed 0/35/00 due to complications.   PAIN:  Are you having pain? Yes: NPRS scale: today 3/10 max 6-7/10 Pain location: Lt shoulder and scapular region  Pain description: aching, numbness Aggravating factors: sleep at night,  Relieving factors: pain meds  PRECAUTIONS: Other: s/p mastectomy with complications  WEIGHT BEARING RESTRICTIONS none   FALLS:  Has patient fallen in last 6 months? No  LIVING ENVIRONMENT: Lives with: lives with their family Lives in: House/apartment   OCCUPATION: Pt is on FMLA  PLOF: Independent  PATIENT GOALS reduce Lt UE pain, return to prior level of function  OBJECTIVE:   DIAGNOSTIC FINDINGS:  NA  PATIENT SURVEYS:  Quick Dash 56 09/22/21: 22.72   COGNITION: Overall cognitive status: Within functional limits for tasks assessed   SENSATION: WFL  POSTURE: rounded shoulders  PALPATION: Trigger points in bil rhomboids, subscapularis and upper points on the Lt, reduced scapular mobility.  Healing surgical incisions in Lt lats, and distal to Lt breast.  Edema distal/lateral portion of Lt breast.     CERVICAL ROM:   WFLS with Lt UT pain with Rt sidebending UPPER EXTREMITY ROM:  Active ROM Right eval Left eval Left 09/05/21 Left 09/12/21 Lt  09/22/21  Shoulder flexion  90 110 P/ROM 115 active 127 active   Shoulder extension       Shoulder abduction  70  100 active  110 active   Shoulder adduction       Shoulder extension       Shoulder internal rotation  To Lt gluteals To L1 (active) T8 (active  T6-Active    Shoulder external rotation  Elbow flexion       Elbow extension       Wrist flexion       Wrist extension       Wrist ulnar deviation       Wrist radial deviation       Wrist pronation       Wrist supination        (Blank rows = not tested)  UPPER EXTREMITY MMT: Tested in neutral: flexion 4-/5, ext 4/5, IR 4/5, ER 4-/5    TODAY'S TREATMENT:  Date: 09/22/21 UE Ranger: standing flexion  Overhead pulleys: flexion x 3 minutes, abduction x 2 min, IR x 1 min Supine on foam roll: static pec stretch x 3 minutes  Red theraband: horizontal abduction and ER x10 each Open book with foam roll anchor x 10 on Lt Thoracic mobs using foam roll: various levels  Shoulder flexion 1# added 2x10    Date: 09/19/21:  Trigger Point Dry-Needling  Treatment instructions: Expect mild to moderate muscle soreness. S/S of pneumothorax if dry needled over a lung field, and to seek immediate medical attention should they occur. Patient verbalized understanding of these instructions and education.  Patient Consent Given: Yes Education handout provided: Yes Muscles treated: Lt rhomboids, subscapularis, upper traps, Lt lats along rib cage, Lt triceps  Treatment response/outcome: Utilized skilled palpation to identify trigger points.  During dry needling able to palpate muscle twitch and muscle elongation   Skilled palpation and monitoring by PT during dry needling  Elongation and release to Lt scapula after DN Manual: P/ROM to Lt shoulder into flexion, IR/ER to pt tolerance Scar massage to lat scar and PT educated pt on how to mobilize at home  Date: 09/14/21 Finger ladder: Lt x12 Supine cane flexion x 10 Yellow theraband: horizontal abduction and ER x10 each Cat/cow: x5 Manual: scapular mobs, scar mobs and elongation to L scapular musculature    PATIENT EDUCATION:  Access Code: [email or web address] URL: [email or web address] Date: 09/22/2021 Prepared by: Claiborne Billings  Exercises - Supine Shoulder  Horizontal Abduction with Resistance  - 2 x daily - 7 x weekly - 2 sets - 10 reps - Supine Bilateral Shoulder External Rotation with Resistance  - 2 x daily - 7 x weekly - 2 sets - 10 reps - Cat Cow  - 2 x daily - 7 x weekly - 1 sets - 10 reps - Supine Static Chest Stretch on Foam Roll  - 2 x daily - 7 x weekly - 3-5 minutes  hold - Thoracic Mobilization on Foam Roll  - 2 x daily - 7 x weekly - 1 sets - 10 reps - Sidelying Open Book Thoracic Rotation with Knee on Foam Roll  - 2 x daily - 7 x weekly - 1 sets - 10 reps - Seated Shoulder Abduction AAROM with Pulley Behind  - 1 x daily - 7 x weekly - 3 sets - 10 reps - Seated Shoulder Flexion AAROM with Pulley Behind  - 1 x daily - 7 x weekly - 3 sets - 10 reps - Seated Shoulder Internal Rotation AAROM with Pulley  - 1 x daily - 7 x weekly - 3 sets - 10 reps   HOME EXERCISE PROGRAM: Access Code: [email or web address]   ASSESSMENT:  CLINICAL IMPRESSION: Pt reports 75% overall reduction in scapular symptoms since the start of care.  Pt is making steady gains with Lt shoulder ROM and functional use.   PT issued flexibility exercises using a foam roll supine and open book. DASH  score is significantly improved.   Patient will benefit from skilled PT to address the below impairments and improve overall function.    OBJECTIVE IMPAIRMENTS decreased ROM, decreased strength, increased edema, increased fascial restrictions, increased muscle spasms, postural dysfunction, and pain.   ACTIVITY LIMITATIONS bathing, dressing, reach over head, and hygiene/grooming  PARTICIPATION LIMITATIONS: meal prep, cleaning, driving, and community activity  PERSONAL FACTORS 1 comorbidity: breast surgery with complications   are also affecting patient's functional outcome.   REHAB POTENTIAL: Good  CLINICAL DECISION MAKING: Stable/uncomplicated  EVALUATION COMPLEXITY: Low   GOALS: Goals reviewed with patient? Yes  SHORT TERM GOALS: Target date: 09/20/2021   Be independent in  initial HEP as allowed by MD Baseline:  Goal status: Met   2.  Report > or = to 30% reduction in Lt shoulder/scapular pain with ADLs and self-care Baseline: 09/05/21 Goal status: MET  3.  Demonstrate> or = to 110 degrees of Lt shoulder flexion A/ROM to improve overhead reaching  Baseline: 115 (09/12/21) Goal status: met   4.  Improve DASH to < or = to 40  Baseline: 22.72 Goal status: MET  LONG TERM GOALS: Target date: 10/18/2021  Be independent in advanced HEP Baseline:  Goal status: INITIAL  2.  Improve Lt shoulder A/ROM flexion to > or = to 135 degrees to improve overhead reaching  Baseline: 90 Goal status: INITIAL  3.  Improve DASH to < or = to 25 Baseline: 22 (09/22/21) Goal status: MET   4.  Report < or = to 3/10 Lt shoulder/scapular pain with use  Baseline:  Goal status: INITIAL  5.  Demonstrate Lt shoulder A/ROM IR to > or = to T10 to improve self-care Baseline: T8 (09/12/21) Goal status: Met      PLAN: PT FREQUENCY: 1-2x/week  PT DURATION: 8 weeks  PLANNED INTERVENTIONS: Therapeutic exercises, Therapeutic activity, Neuromuscular re-education, Patient/Family education, Joint mobilization, Dry Needling, Cryotherapy, Moist heat, scar mobilization, Taping, and Manual therapy  PLAN FOR NEXT SESSION: Manual, gentle flexibility, DN 1x/wk. mobilize scapula and incisions.    Sigurd Sos, PT 09/22/21 1:20 PM   [facility] [address], [place], [place] [address] Phone # [phone] Fax [phone]     a

## 2021-09-23 ENCOUNTER — Telehealth: Payer: Self-pay | Admitting: Student

## 2021-09-23 MED ORDER — DOXYCYCLINE HYCLATE 100 MG PO TABS: 100.0000 mg | ORAL_TABLET | Freq: Two times a day (BID) | ORAL | 0 refills | Status: AC

## 2021-09-23 NOTE — Telephone Encounter (Signed)
I called the patient to see how she was doing.  Patient reports she is doing well.  She states that the area of redness to her left breast is approximately the same as it was when she was seen in the clinic last Friday.  She states it has not gotten any redder, but she reports it has not improved much.  She states that it is nonpainful.  She states that there is no swelling.  She denies any drainage from the incision.  She denies any fevers or chills.  Patient states she has been taking her antibiotics.  She states tomorrow is her last day of antibiotics.  I discussed the possibility of sending in extra antibiotics.  Patient was agreeable with the plan.  I discussed the plan with Dr. Marla Roe, and she feels the patient would benefit from extra antibiotics.  I sent the patient a MyChart message regarding this.  Will send an extra 7 days of doxycycline twice daily.

## 2021-09-27 ENCOUNTER — Ambulatory Visit (INDEPENDENT_AMBULATORY_CARE_PROVIDER_SITE_OTHER): Payer: BC Managed Care – PPO | Admitting: Student

## 2021-09-27 DIAGNOSIS — Z9889 Other specified postprocedural states: Secondary | ICD-10-CM

## 2021-09-27 NOTE — Progress Notes (Signed)
Patient is a 41 year old female with history of breast cancer.  She underwent breast reconstruction with tissue expander placement with flex HD placement on 05/12/2021 with Dr. Marla Roe.  Patient has had difficulties with wound healing over the left breast and underwent a left breast reconstruction with latissimus flap and tissue expander placement on 07/18/2021 with Dr. Marla Roe.  Patient was last seen in the clinic on 09/16/2021.  At this visit, she reported she was doing well.  She denied any issues.  On exam, there did appear to be an area of erythema to her left medial breast.  There is no cellulitic changes to the skin.  Patient was prescribed antibiotics at that time.  Patient was later then prescribed antibiotics a week later, since patient reported her erythema had not improved.  Today, patient reports she is doing well.  She reports she is a little sore after completing physical therapy yesterday.  Patient states that overall her pain is controlled.  She states that she feels the redness is about the same.  She denies any swelling or tenderness at the area of erythema.  She denies any fevers or chills.  On exam, patient is sitting upright in no acute distress.  Her right breast expander is in place.  The left breast expander is in place.  The area of the erythema is significantly improved since her last visit.  There is some mild erythema that is blanchable.  There is no cellulitic changes to the skin.  There is some minor swelling.  Incisions to the back and left breast are healing well.  I discussed with the patient that she should finish her antibiotic.  I discussed with her that she should call us if she feels the area of redness gets worse, she develops fevers or chills, or if the area gets painful.  I discussed with the patient that we would hold off on an expander fill today until she is completed all of her antibiotic and that the redness has completely resolved.  Patient agreed with  plan.  Patient to follow-up next week for evaluation and possible expander fill.  Instructed patient to call if she has any questions or concerns.  Pictures were obtained of the patient and placed in patient's chart with the patient's permission.

## 2021-09-28 DIAGNOSIS — Z719 Counseling, unspecified: Secondary | ICD-10-CM

## 2021-09-29 ENCOUNTER — Encounter
Payer: BC Managed Care – PPO | Attending: Physical Medicine & Rehabilitation | Admitting: Physical Medicine & Rehabilitation

## 2021-09-29 VITALS — BP 132/85 | HR 76 | Ht 69.0 in | Wt 181.4 lb

## 2021-09-29 DIAGNOSIS — M792 Neuralgia and neuritis, unspecified: Secondary | ICD-10-CM

## 2021-09-29 DIAGNOSIS — F319 Bipolar disorder, unspecified: Secondary | ICD-10-CM | POA: Diagnosis present

## 2021-09-29 DIAGNOSIS — G8918 Other acute postprocedural pain: Secondary | ICD-10-CM

## 2021-09-29 MED ORDER — PREGABALIN 50 MG PO CAPS: 50.0000 mg | ORAL_CAPSULE | Freq: Three times a day (TID) | ORAL | 2 refills | Status: DC

## 2021-09-29 NOTE — Progress Notes (Signed)
Subjective:    Patient ID: Haley Evans, female    DOB: 24-Feb-1981, 41 y.o.   MRN: [id number]  HPI Haley Evans is a 41 year old female with past medical history of bipolar disorder, breast cancer here for chronic pain.  Patient had mastectomy 05/12/2021.  She then had left breast reconstruction with placement of tissue expander 06/15/2021.  On 07/18/2021 she had another surgery after having poor wound healing and had left breast reconstruction with latissimus flap and tissue expander by Dr. Marla Roe.  Patient reports that she has had worsening pain since her surgeries.  Pain has has most severe after her third surgery. She has had worsening pain over her left chest back and shoulder.  She has burning and tingling pain over her back at the site of the latissimus flap.  She has dull aching pain around her scapula.  She also has aching in shooting pain around her chest.  She has tried Voltaren gel and Flexeril without much benefit.  She is working with physical therapy and completing dry needling with improvement to her pain.  She has also tried gabapentin 100 mg 3 times daily but reports it makes her feel foggy.  It does provide some benefit to the pain. She also did not take the medication regularly.  She also tried Tylenol and Advil with mild to moderate improvement in her pain when she takes them. She has a history of bipolar disorder. She reports she is not on any medications at this time although she feels her bipolar disorder has been stable recently.  Pain Inventory Average Pain 7 Pain Right Now 7 My pain is intermittent, burning, dull, tingling, and aching  In the last 24 hours, has pain interfered with the following? General activity 6 Relation with others 5 Enjoyment of life 6 What TIME of day is your pain at its worst? evening and night Sleep (in general) Fair  Pain is worse with: sitting, standing, and some activites Pain improves with: rest, therapy/exercise, medication, and  dry needling Relief from Meds: 7  how many minutes can you walk? 30+ ability to climb steps?  yes do you drive?  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  Tobacco Use   Smoking status: Former    Types: Cigarettes    Quit date: 04/12/2021    Years since quitting: 0.4   Smokeless tobacco: Never  Vaping Use   Vaping Use: Never used  Substance and Sexual Activity   Alcohol use: Yes    Alcohol/week: 5.0 standard drinks of alcohol    Types: 1 Glasses of wine, 4 Cans of beer per week    Comment: weekly   Drug use: No   Sexual activity: Not on file  Other Topics Concern   Not on file  Social History Narrative   Not on file   Social Determinants of Health   Financial Resource Strain: Not on  file  Food Insecurity: Not on file  Transportation Needs: Not on file  Physical Activity: Not on file  Stress: Not on file  Social Connections: Not on file   Past Surgical History:  Procedure Laterality Date   ARM WOUND REPAIR / CLOSURE Right    fractured arm   BREAST RECONSTRUCTION WITH PLACEMENT OF TISSUE EXPANDER AND FLEX HD (ACELLULAR HYDRATED DERMIS) Left 06/15/2021   Procedure: REPLACEMENT OF TISSUE EXPANDER AND FLEX HD (ACELLULAR HYDRATED DERMIS);  Surgeon: Wallace Going, DO;  Location: [facility];  Service: Plastics;  Laterality: Left;   CESAREAN SECTION     DEBRIDEMENT AND CLOSURE WOUND Left 06/15/2021   Procedure: DEBRIDEMENT AND CLOSURE LEFT  BREAST WOUND;  Surgeon: Wallace Going, DO;  Location: [facility];  Service: Plastics;  Laterality: Left;  1 hour   LATISSIMUS FLAP TO BREAST Left 07/18/2021   Procedure: LATISSIMUS FLAP TO BREAST;  Surgeon: Wallace Going, DO;  Location: [facility];  Service: Plastics;  Laterality: Left;   NIPPLE SPARING MASTECTOMY Right 05/12/2021   Procedure: RIGHT NIPPLE SPARING MASTECTOMY;  Surgeon: Jovita Kussmaul, MD;  Location: [facility];  Service: General;  Laterality: Right;   NIPPLE SPARING MASTECTOMY WITH SENTINEL LYMPH NODE BIOPSY Left 05/12/2021   Procedure: LEFT NIPPLE SPARING MASTECTOMY WITH LEFT SENTINEL LYMPH NODE BIOPSY;  Surgeon: Jovita Kussmaul, MD;  Location: [facility];  Service: General;  Laterality: Left;   PRE-PECTORAL BREAST RECON W/ PLACEMENT OF TISSUE EXPANDERS, DERMAL GRAFT Bilateral 05/12/2021   Procedure: IMMEDIATE BILATERAL BREAST RECONSTRUCTION WITH PLACEMENT OF TISSUE EXPANDERS AND FLEX HD;  Surgeon: Wallace Going, DO;  Location: [facility];  Service: Plastics;  Laterality: Bilateral;   TISSUE EXPANDER PLACEMENT Left 07/18/2021   Procedure: REMOVAL AND REPLACEMENT OF TISSUE EXPANDER;  Surgeon: Wallace Going, DO;  Location: [facility];  Service: Plastics;  Laterality: Left;  3 hours   Past Medical History:  Diagnosis Date   Anemia    Anxiety    Bipolar 2 disorder ([place])    Cancer ([facility])    breast   Depression    Family history of adverse reaction to anesthesia    aunt woke up during hip replacement   Seizure ([facility])    vs syncopy, age 2 and 12/2017, Seen by neurologist, a definitive diagnosis was not made. Pt reports having seizure aroudn 2021   BP 132/85   Pulse 76   Ht '5\' 9"'$  (1.753 m)   Wt 181 lb 6.4 oz (82.3 kg)   SpO2 99%   BMI 26.79 kg/m   Opioid Risk Score:   Fall Risk Score:  `1  Depression screen [facility] 2/9     09/29/2021    1:46 PM  Depression screen [facility] 2/9  Decreased Interest 1  Down, Depressed, Hopeless 0   PHQ - 2 Score 1  Altered sleeping 1  Tired, decreased energy 2  Change in appetite 2  Feeling bad or failure about yourself  0  Trouble concentrating 1  Moving slowly or fidgety/restless 0  Suicidal thoughts 0  PHQ-9 Score 7  Difficult doing work/chores Somewhat difficult     Review of Systems  Musculoskeletal:        Left shoulder, upper back pain spasms  Neurological:  Positive for numbness.  Psychiatric/Behavioral:  The patient is nervous/anxious.   All other systems reviewed and are negative.      Objective:   Physical Exam  Gen: no distress, normal appearing HEENT: oral  mucosa pink and moist, NCAT Cardio: Reg rate Chest: normal effort, normal rate of breathing Abd: soft, non-distended Ext: no edema Psych: pleasant, normal affect Skin: intact Neuro: Alert and oriented , follows commands, cranial nerves II through XII intact, normal insight and judgment, sensation intact to light touch in all 4 extremities, no coordination deficits noted Strength 5 out of 5 in all 4 extremities Musculoskeletal: Tenderness noted over left anterior and posterior shoulder, anterior chest, trapezius, upper arm Decreased active abduction of the left shoulder to 90 degrees, pain noted with shoulder external rotation and milder pain with internal rotation S/p latissimus flap surgery on the left No significant pain with L shoulder horizontal flexion, adduction No tenderness L forearm No tenderness noted around R shoulder or back      Assessment & Plan:   Post mastectomy and reconstruction pain, gradually improving. Her description of the pain indicates a large neuropathic component to the pain. She has difficulty tolerating gabapentin. -Start lyrica '50mg'$  TID -Vanlefexine may be option if pain not imrproved with lyrica -Continue physical therapy  Hx of bipolar disorder not on medications -She reports her mood has been stable.  -Will need to take this into account if considering  starting antidepressant class medications. May consider venlafaxine if additional medication option is needed.

## 2021-09-30 ENCOUNTER — Encounter: Payer: Self-pay | Admitting: Plastic Surgery

## 2021-09-30 NOTE — Progress Notes (Signed)
Patient is a 41 year old female with history of breast cancer.  She underwent breast reconstruction with tissue expander placement with flex HD placement on 05/12/2021 with Dr. Marla Roe.  Patient has had difficulties with wound healing over the left breast and underwent a left breast reconstruction with latissimus flap and tissue expander placement on 07/18/2021 with Dr. Marla Roe.  Patient was last seen in the clinic on 09/27/2021.  At this visit, patient reported she was doing well.  Patient denied any fevers or chills at that time.  On exam, the area of erythema had significantly improved since her previous exam.  There were no cellulitic changes to the skin.  Incisions to the breasts and the back were healing well.  Plan was for patient to finish her antibiotic and to follow-up in a week for possible expander fill.  Today, patient reports she is doing okay.  She states that over the weekend, she noticed an area that was draining to her left breast.  She states that there was some blood over the weekend, but now the drainage is clear.  Patient states she finished her antibiotic over the weekend.  Patient denies any other issues at this time.  She denies any fevers or chills.  Chaperone present on exam.  On exam, patient is sitting upright in no acute distress.  The incision to her back to has healed nicely and intact.  Expanders are in place bilaterally.  The left breast incision has also healed well.  The erythema to the medial aspect of the breast is now gone.  To the inferior aspect of the breast, there is a small 1 cm x 1 cm wound with what appears to be hypergranulation tissue in it.  There is no surrounding cellulitic changes to the skin or purulent drainage.  A small amount of silver nitrate was placed on the area of hypergranulation tissue.  A piece of donated collagen sheet was placed on the wound with K-Y jelly, a 2 x 2 gauze and a Mepilex border.  The remainder of the donated collagen sheet  was given to the patient.  I discussed with the patient that she can put a small square of this collagen sheet onto the wound, put a small amount of K-Y jelly and a 2 x 2 gauze every other day.  I discussed with the patient to keep the Mepilex border on and to change the Mepilex border in 3 days.  Patient acknowledged.  I discussed with the patient to avoid strenuous activity and heavy lifting at this time given her wound.  Patient acknowledged.  Instructed patient to call if she has any other questions or concerns.  Patient to follow-up next week.  Dr. Marla Roe also examined the patient and we discussed the plan

## 2021-10-01 ENCOUNTER — Encounter: Payer: Self-pay | Admitting: Physical Medicine & Rehabilitation

## 2021-10-03 ENCOUNTER — Ambulatory Visit: Payer: BC Managed Care – PPO | Attending: Hematology and Oncology

## 2021-10-03 DIAGNOSIS — M25612 Stiffness of left shoulder, not elsewhere classified: Secondary | ICD-10-CM | POA: Insufficient documentation

## 2021-10-03 DIAGNOSIS — R293 Abnormal posture: Secondary | ICD-10-CM | POA: Insufficient documentation

## 2021-10-03 DIAGNOSIS — R252 Cramp and spasm: Secondary | ICD-10-CM | POA: Insufficient documentation

## 2021-10-03 DIAGNOSIS — Z483 Aftercare following surgery for neoplasm: Secondary | ICD-10-CM | POA: Insufficient documentation

## 2021-10-03 NOTE — Therapy (Signed)
OUTPATIENT PHYSICAL THERAPY TREATMENT   Patient Name: Fletcher Rathbun MRN: [id number] DOB:[birth year], 41 y.o., female Today's Date: 10/03/2021   PT End of Session - 10/03/21 1610     Visit Number 7    Date for PT Re-Evaluation 10/18/21    Authorization Type BCBS    PT Start Time 1532    PT Stop Time 1610    PT Time Calculation (min) 38 min    Activity Tolerance Patient tolerated treatment well    Behavior During Therapy WFL for tasks assessed/performed                   Past Medical History:  Diagnosis Date   Anemia    Anxiety    Bipolar 2 disorder ([place])    Cancer ([place])    breast   Depression    Family history of adverse reaction to anesthesia    aunt woke up during hip replacement   Seizure ([place])    vs syncopy, age 39 and 12/2017, Seen by neurologist, a definitive diagnosis was not made. Pt reports having seizure aroudn 2021   Past Surgical History:  Procedure Laterality Date   ARM WOUND REPAIR / CLOSURE Right    fractured arm   BREAST RECONSTRUCTION WITH PLACEMENT OF TISSUE EXPANDER AND FLEX HD (ACELLULAR HYDRATED DERMIS) Left 06/15/2021   Procedure: REPLACEMENT OF TISSUE EXPANDER AND FLEX HD (ACELLULAR HYDRATED DERMIS);  Surgeon: Wallace Going, DO;  Location: [place];  Service: Plastics;  Laterality: Left;   CESAREAN SECTION     DEBRIDEMENT AND CLOSURE WOUND Left 06/15/2021   Procedure: DEBRIDEMENT AND CLOSURE LEFT BREAST WOUND;  Surgeon: Wallace Going, DO;  Location: [place];  Service: Plastics;  Laterality: Left;  1 hour   LATISSIMUS FLAP TO BREAST Left 07/18/2021   Procedure: LATISSIMUS FLAP TO BREAST;  Surgeon: Wallace Going, DO;  Location: [place];  Service: Plastics;  Laterality: Left;   NIPPLE SPARING MASTECTOMY Right 05/12/2021   Procedure: RIGHT NIPPLE SPARING MASTECTOMY;  Surgeon: Jovita Kussmaul, MD;  Location: [place];  Service: General;  Laterality: Right;   NIPPLE SPARING MASTECTOMY WITH SENTINEL LYMPH NODE BIOPSY  Left 05/12/2021   Procedure: LEFT NIPPLE SPARING MASTECTOMY WITH LEFT SENTINEL LYMPH NODE BIOPSY;  Surgeon: Jovita Kussmaul, MD;  Location: [place];  Service: General;  Laterality: Left;   PRE-PECTORAL BREAST RECON W/ PLACEMENT OF TISSUE EXPANDERS, DERMAL GRAFT Bilateral 05/12/2021   Procedure: IMMEDIATE BILATERAL BREAST RECONSTRUCTION WITH PLACEMENT OF TISSUE EXPANDERS AND FLEX HD;  Surgeon: Wallace Going, DO;  Location: [place];  Service: Plastics;  Laterality: Bilateral;   TISSUE EXPANDER PLACEMENT Left 07/18/2021   Procedure: REMOVAL AND REPLACEMENT OF TISSUE EXPANDER;  Surgeon: Wallace Going, DO;  Location: [place];  Service: Plastics;  Laterality: Left;  3 hours   Patient Active Problem List   Diagnosis Date Noted   Admission for removal of tissue expander of breast, unspecified laterality 07/18/2021   Acquired absence of left breast 07/18/2021   Open wound of left breast 07/15/2021   Breast cancer ([place]) 05/12/2021   Genetic testing 03/18/2021   Family history of pancreatic cancer 03/10/2021   Malignant neoplasm of upper-outer quadrant of left breast in female, estrogen receptor positive ([place]) 03/07/2021   Anxiety 06/25/2011    PCP: Eldridge Abrahams, NP  REFERRING PROVIDER: Nicholas Lose, MD  REFERRING DIAG: [phone] (ICD-10-CM) - Malignant neoplasm of upper-outer quadrant of left breast in female, estrogen receptor positive ([place]  THERAPY DIAG:  Aftercare following surgery for neoplasm  Abnormal posture  Cramp and spasm  Stiffness of left shoulder, not elsewhere classified  Rationale for Evaluation and Treatment Rehabilitation  ONSET DATE: shoulder pain began after surgery last month  SUBJECTIVE:                                                                                                                                                                                                         SUBJECTIVE STATEMENT: I have an  area under my breast that I sent a photo of to the MD.  I will see her tomorrow.  My ROM in my Lt shoulder feels better overall.  My shoulder blade is catching now.    PERTINENT HISTORY:  Patient was diagnosed on 01/12/2021 with left grade II invasive ductal carcinoma breast cancer. She underwent a bilateral nipple sparing mastectomy and left sentinel node biopsy (3 negative nodes) on 05/12/2021 with expanders placed for reconstruction. It is ER/PR positive and HER2 negative with a Ki67 of 1%. Muscle flap reconstruction performed 0/81/44 due to complications.   PAIN:  Are you having pain? Yes: NPRS scale: 7/10 Pain location: Lt shoulder and scapular region  Pain description: aching, numbness Aggravating factors: sleep at night,  Relieving factors: pain meds  PRECAUTIONS: Other: s/p mastectomy with complications  WEIGHT BEARING RESTRICTIONS none   FALLS:  Has patient fallen in last 6 months? No  LIVING ENVIRONMENT: Lives with: lives with their family Lives in: House/apartment   OCCUPATION: Pt is on FMLA  PLOF: Independent  PATIENT GOALS reduce Lt UE pain, return to prior level of function  OBJECTIVE:   DIAGNOSTIC FINDINGS:  NA  PATIENT SURVEYS:  Quick Dash 56 09/22/21: 22.72   COGNITION: Overall cognitive status: Within functional limits for tasks assessed   SENSATION: WFL  POSTURE: rounded shoulders  PALPATION: Trigger points in bil rhomboids, subscapularis and upper points on the Lt, reduced scapular mobility.  Healing surgical incisions in Lt lats, and distal to Lt breast.  Edema distal/lateral portion of Lt breast.     CERVICAL ROM:   WFLS with Lt UT pain with Rt sidebending UPPER EXTREMITY ROM:  Active ROM Right eval Left eval Left 09/05/21 Left 09/12/21 Lt  09/22/21  Shoulder flexion  90 110 P/ROM 115 active 127 active   Shoulder extension       Shoulder abduction  70  100 active  110 active   Shoulder adduction       Shoulder extension        Shoulder internal rotation  To  Lt gluteals To L1 (active) T8 (active  T6-Active   Shoulder external rotation       Elbow flexion       Elbow extension       Wrist flexion       Wrist extension       Wrist ulnar deviation       Wrist radial deviation       Wrist pronation       Wrist supination        (Blank rows = not tested)  UPPER EXTREMITY MMT: Tested in neutral: flexion 4-/5, ext 4/5, IR 4/5, ER 4-/5    TODAY'S TREATMENT:  Date: 10/03/21:  Trigger Point Dry-Needling  Treatment instructions: Expect mild to moderate muscle soreness. S/S of pneumothorax if dry needled over a lung field, and to seek immediate medical attention should they occur. Patient verbalized understanding of these instructions and education.  Patient Consent Given: Yes Education handout provided: Yes Muscles treated: Lt rhomboids, subscapularis, upper traps, Lt posterior and lateral deltoid Treatment response/outcome: Utilized skilled palpation to identify trigger points.  During dry needling able to palpate muscle twitch and muscle elongation   Skilled palpation and monitoring by PT during dry needling  Elongation and release to Lt scapula after DN Manual: P/ROM to Lt shoulder into flexion, IR/ER to pt tolerance Scar massage to lat scar and PT educated pt on how to mobilize at home    Date: 09/22/21 UE Ranger: standing flexion  Overhead pulleys: flexion x 3 minutes, abduction x 2 min, IR x 1 min Supine on foam roll: static pec stretch x 3 minutes  Red theraband: horizontal abduction and ER x10 each Open book with foam roll anchor x 10 on Lt Thoracic mobs using foam roll: various levels  Shoulder flexion 1# added 2x10  Date: 09/19/21:  Trigger Point Dry-Needling  Treatment instructions: Expect mild to moderate muscle soreness. S/S of pneumothorax if dry needled over a lung field, and to seek immediate medical attention should they occur. Patient verbalized understanding of these instructions and  education.  Patient Consent Given: Yes Education handout provided: Yes Muscles treated: Lt rhomboids, subscapularis, upper traps, Lt lats along rib cage, Lt triceps  Treatment response/outcome: Utilized skilled palpation to identify trigger points.  During dry needling able to palpate muscle twitch and muscle elongation   Skilled palpation and monitoring by PT during dry needling  Elongation and release to Lt scapula after DN Manual: P/ROM to Lt shoulder into flexion, IR/ER to pt tolerance Scar massage to lat scar and PT educated pt on how to mobilize at home    PATIENT EDUCATION:  Access Code: [email or web address] URL: [email or web address] Date: 09/22/2021 Prepared by: Claiborne Billings  Exercises - Supine Shoulder Horizontal Abduction with Resistance  - 2 x daily - 7 x weekly - 2 sets - 10 reps - Supine Bilateral Shoulder External Rotation with Resistance  - 2 x daily - 7 x weekly - 2 sets - 10 reps - Cat Cow  - 2 x daily - 7 x weekly - 1 sets - 10 reps - Supine Static Chest Stretch on Foam Roll  - 2 x daily - 7 x weekly - 3-5 minutes  hold - Thoracic Mobilization on Foam Roll  - 2 x daily - 7 x weekly - 1 sets - 10 reps - Sidelying Open Book Thoracic Rotation with Knee on Foam Roll  - 2 x daily - 7 x weekly - 1 sets - 10 reps - Seated Shoulder Abduction AAROM with  Pulley Behind  - 1 x daily - 7 x weekly - 3 sets - 10 reps - Seated Shoulder Flexion AAROM with Pulley Behind  - 1 x daily - 7 x weekly - 3 sets - 10 reps - Seated Shoulder Internal Rotation AAROM with Pulley  - 1 x daily - 7 x weekly - 3 sets - 10 reps   HOME EXERCISE PROGRAM: Access Code: [email or web address]   ASSESSMENT:  CLINICAL IMPRESSION: Pt reports that she has pulleys and foam roll at home.  Pt reports improved A/ROM and is using Lt UE more and is able to do gardening now. Pt with tension and trigger points in Lt scapula and lateral deltoid and had good response to DN with twitch and improved tissue mobility after manual  therapy today. Patient will benefit from skilled PT to address the below impairments and improve overall function.    OBJECTIVE IMPAIRMENTS decreased ROM, decreased strength, increased edema, increased fascial restrictions, increased muscle spasms, postural dysfunction, and pain.   ACTIVITY LIMITATIONS bathing, dressing, reach over head, and hygiene/grooming  PARTICIPATION LIMITATIONS: meal prep, cleaning, driving, and community activity  PERSONAL FACTORS 1 comorbidity: breast surgery with complications   are also affecting patient's functional outcome.   REHAB POTENTIAL: Good  CLINICAL DECISION MAKING: Stable/uncomplicated  EVALUATION COMPLEXITY: Low   GOALS: Goals reviewed with patient? Yes  SHORT TERM GOALS: Target date: 09/20/2021   Be independent in initial HEP as allowed by MD Baseline:  Goal status: Met   2.  Report > or = to 30% reduction in Lt shoulder/scapular pain with ADLs and self-care Baseline: 09/05/21 Goal status: MET  3.  Demonstrate> or = to 110 degrees of Lt shoulder flexion A/ROM to improve overhead reaching  Baseline: 115 (09/12/21) Goal status: met   4.  Improve DASH to < or = to 40  Baseline: 22.72 Goal status: MET  LONG TERM GOALS: Target date: 10/18/2021  Be independent in advanced HEP Baseline:  Goal status: INITIAL  2.  Improve Lt shoulder A/ROM flexion to > or = to 135 degrees to improve overhead reaching  Baseline: 90 Goal status: INITIAL  3.  Improve DASH to < or = to 25 Baseline: 22 (09/22/21) Goal status: MET   4.  Report < or = to 3/10 Lt shoulder/scapular pain with use  Baseline:  Goal status: INITIAL  5.  Demonstrate Lt shoulder A/ROM IR to > or = to T10 to improve self-care Baseline: T8 (09/12/21) Goal status: Met      PLAN: PT FREQUENCY: 1-2x/week  PT DURATION: 8 weeks  PLANNED INTERVENTIONS: Therapeutic exercises, Therapeutic activity, Neuromuscular re-education, Patient/Family education, Joint mobilization, Dry  Needling, Cryotherapy, Moist heat, scar mobilization, Taping, and Manual therapy  PLAN FOR NEXT SESSION: Manual, gentle flexibility, DN 1x/wk. mobilize scapula and incisions. See what MD says about area below breast   Sigurd Sos, PT 10/03/21 4:14 PM   [facility] [address] [place], [place] [address] Phone # [phone] Fax [phone]     a

## 2021-10-04 ENCOUNTER — Ambulatory Visit: Payer: BC Managed Care – PPO | Admitting: Student

## 2021-10-04 ENCOUNTER — Ambulatory Visit (INDEPENDENT_AMBULATORY_CARE_PROVIDER_SITE_OTHER): Payer: BC Managed Care – PPO | Admitting: Student

## 2021-10-04 DIAGNOSIS — Z9889 Other specified postprocedural states: Secondary | ICD-10-CM

## 2021-10-05 ENCOUNTER — Ambulatory Visit: Payer: BC Managed Care – PPO

## 2021-10-05 DIAGNOSIS — Z483 Aftercare following surgery for neoplasm: Secondary | ICD-10-CM | POA: Diagnosis not present

## 2021-10-05 DIAGNOSIS — M25612 Stiffness of left shoulder, not elsewhere classified: Secondary | ICD-10-CM

## 2021-10-05 DIAGNOSIS — R293 Abnormal posture: Secondary | ICD-10-CM

## 2021-10-05 DIAGNOSIS — R252 Cramp and spasm: Secondary | ICD-10-CM

## 2021-10-05 NOTE — Therapy (Signed)
OUTPATIENT PHYSICAL THERAPY TREATMENT   Patient Name: Haley Evans MRN: [id number] DOB:[birth year], 41 y.o., female Today's Date: 10/05/2021   PT End of Session - 10/05/21 1615     Visit Number 8    Date for PT Re-Evaluation 10/18/21    Authorization Type BCBS    PT Start Time 1526    PT Stop Time 1615    PT Time Calculation (min) 49 min    Activity Tolerance Patient tolerated treatment well    Behavior During Therapy WFL for tasks assessed/performed                    Past Medical History:  Diagnosis Date   Anemia    Anxiety    Bipolar 2 disorder ([place])    Cancer ([place])    breast   Depression    Family history of adverse reaction to anesthesia    aunt woke up during hip replacement   Seizure ([place])    vs syncopy, age 36 and 12/2017, Seen by neurologist, a definitive diagnosis was not made. Pt reports having seizure aroudn 2021   Past Surgical History:  Procedure Laterality Date   ARM WOUND REPAIR / CLOSURE Right    fractured arm   BREAST RECONSTRUCTION WITH PLACEMENT OF TISSUE EXPANDER AND FLEX HD (ACELLULAR HYDRATED DERMIS) Left 06/15/2021   Procedure: REPLACEMENT OF TISSUE EXPANDER AND FLEX HD (ACELLULAR HYDRATED DERMIS);  Surgeon: Wallace Going, DO;  Location: [place];  Service: Plastics;  Laterality: Left;   CESAREAN SECTION     DEBRIDEMENT AND CLOSURE WOUND Left 06/15/2021   Procedure: DEBRIDEMENT AND CLOSURE LEFT BREAST WOUND;  Surgeon: Wallace Going, DO;  Location: [place];  Service: Plastics;  Laterality: Left;  1 hour   LATISSIMUS FLAP TO BREAST Left 07/18/2021   Procedure: LATISSIMUS FLAP TO BREAST;  Surgeon: Wallace Going, DO;  Location: [place];  Service: Plastics;  Laterality: Left;   NIPPLE SPARING MASTECTOMY Right 05/12/2021   Procedure: RIGHT NIPPLE SPARING MASTECTOMY;  Surgeon: Jovita Kussmaul, MD;  Location: [place];  Service: General;  Laterality: Right;   NIPPLE SPARING MASTECTOMY WITH SENTINEL LYMPH NODE BIOPSY  Left 05/12/2021   Procedure: LEFT NIPPLE SPARING MASTECTOMY WITH LEFT SENTINEL LYMPH NODE BIOPSY;  Surgeon: Jovita Kussmaul, MD;  Location: [place];  Service: General;  Laterality: Left;   PRE-PECTORAL BREAST RECON W/ PLACEMENT OF TISSUE EXPANDERS, DERMAL GRAFT Bilateral 05/12/2021   Procedure: IMMEDIATE BILATERAL BREAST RECONSTRUCTION WITH PLACEMENT OF TISSUE EXPANDERS AND FLEX HD;  Surgeon: Wallace Going, DO;  Location: [place];  Service: Plastics;  Laterality: Bilateral;   TISSUE EXPANDER PLACEMENT Left 07/18/2021   Procedure: REMOVAL AND REPLACEMENT OF TISSUE EXPANDER;  Surgeon: Wallace Going, DO;  Location: [place];  Service: Plastics;  Laterality: Left;  3 hours   Patient Active Problem List   Diagnosis Date Noted   Admission for removal of tissue expander of breast, unspecified laterality 07/18/2021   Acquired absence of left breast 07/18/2021   Open wound of left breast 07/15/2021   Breast cancer ([place]) 05/12/2021   Genetic testing 03/18/2021   Family history of pancreatic cancer 03/10/2021   Malignant neoplasm of upper-outer quadrant of left breast in female, estrogen receptor positive ([place]) 03/07/2021   Anxiety 06/25/2011    PCP: Eldridge Abrahams, NP  REFERRING PROVIDER: Nicholas Lose, MD  REFERRING DIAG: [phone] (ICD-10-CM) - Malignant neoplasm of upper-outer quadrant of left breast in female, estrogen receptor positive (  [place]  THERAPY DIAG:  Aftercare following surgery for neoplasm  Abnormal posture  Cramp and spasm  Stiffness of left shoulder, not elsewhere classified  Rationale for Evaluation and Treatment Rehabilitation  ONSET DATE: shoulder pain began after surgery last month  SUBJECTIVE:                                                                                                                                                                                                         SUBJECTIVE STATEMENT: I saw the  MD and my incision is open and I am now on restrictions for lifting and stretching.    PERTINENT HISTORY:  Patient was diagnosed on 01/12/2021 with left grade II invasive ductal carcinoma breast cancer. She underwent a bilateral nipple sparing mastectomy and left sentinel node biopsy (3 negative nodes) on 05/12/2021 with expanders placed for reconstruction. It is ER/PR positive and HER2 negative with a Ki67 of 1%. Muscle flap reconstruction performed 04/21/95 due to complications.   PAIN:  Are you having pain? Yes: NPRS scale: 5/10 Pain location: Lt shoulder and scapular region  Pain description: aching, numbness, uncomfortable  Aggravating factors: sleep at night,  Relieving factors: pain meds  PRECAUTIONS: Other: s/p mastectomy with complications  WEIGHT BEARING RESTRICTIONS none   FALLS:  Has patient fallen in last 6 months? No  LIVING ENVIRONMENT: Lives with: lives with their family Lives in: House/apartment   OCCUPATION: Pt is on FMLA  PLOF: Independent  PATIENT GOALS reduce Lt UE pain, return to prior level of function  OBJECTIVE:   DIAGNOSTIC FINDINGS:  NA  PATIENT SURVEYS:  Quick Dash 56 09/22/21: 22.72  COGNITION: Overall cognitive status: Within functional limits for tasks assessed  SENSATION: WFL  POSTURE: rounded shoulders  PALPATION: Trigger points in bil rhomboids, subscapularis and upper points on the Lt, reduced scapular mobility.  Healing surgical incisions in Lt lats, and distal to Lt breast.  Edema distal/lateral portion of Lt breast.     CERVICAL ROM:   WFLS with Lt UT pain with Rt sidebending UPPER EXTREMITY ROM:  Active ROM Right eval Left eval Left 09/05/21 Left 09/12/21 Lt  09/22/21  Shoulder flexion  90 110 P/ROM 115 active 127 active   Shoulder extension       Shoulder abduction  70  100 active  110 active   Shoulder adduction       Shoulder extension       Shoulder internal rotation  To Lt gluteals To L1 (active) T8 (active   T6-Active   Shoulder external rotation  Elbow flexion       Elbow extension       Wrist flexion       Wrist extension       Wrist ulnar deviation       Wrist radial deviation       Wrist pronation       Wrist supination        (Blank rows = not tested)  UPPER EXTREMITY MMT: Tested in neutral: flexion 4-/5, ext 4/5, IR 4/5, ER 4-/5    TODAY'S TREATMENT:  Date: 10/05/21: Standing bent over row, shoulder extension and horizontal abduction: 2# 2x10 Supine: serratus punches 2# x15 Sidelying ER 2# x20  Trigger Point Dry-Needling  Treatment instructions: Expect mild to moderate muscle soreness. S/S of pneumothorax if dry needled over a lung field, and to seek immediate medical attention should they occur. Patient verbalized understanding of these instructions and education.  Patient Consent Given: Yes Education handout provided: Yes Muscles treated: Bil thoracic multifidi Treatment response/outcome: Utilized skilled palpation to identify trigger points.  During dry needling able to palpate muscle twitch and muscle elongation   Skilled palpation and monitoring by PT during dry needling  Elongation and release to Lt scapula after DN, PA mobs to thoracic spine   Date: 10/03/21:  Trigger Point Dry-Needling  Treatment instructions: Expect mild to moderate muscle soreness. S/S of pneumothorax if dry needled over a lung field, and to seek immediate medical attention should they occur. Patient verbalized understanding of these instructions and education.  Patient Consent Given: Yes Education handout provided: Yes Muscles treated: Lt rhomboids, subscapularis, upper traps, Lt posterior and lateral deltoid Treatment response/outcome: Utilized skilled palpation to identify trigger points.  During dry needling able to palpate muscle twitch and muscle elongation   Skilled palpation and monitoring by PT during dry needling  Elongation and release to Lt scapula after DN Manual: P/ROM to Lt  shoulder into flexion, IR/ER to pt tolerance Scar massage to lat scar and PT educated pt on how to mobilize at home    Date: 09/22/21 UE Ranger: standing flexion  Overhead pulleys: flexion x 3 minutes, abduction x 2 min, IR x 1 min Supine on foam roll: static pec stretch x 3 minutes  Red theraband: horizontal abduction and ER x10 each Open book with foam roll anchor x 10 on Lt Thoracic mobs using foam roll: various levels  Shoulder flexion 1# added 2x10   PATIENT EDUCATION:  Access Code: [email or web address] URL: [email or web address] Date: 09/22/2021 Prepared by: Claiborne Billings  Exercises - Supine Shoulder Horizontal Abduction with Resistance  - 2 x daily - 7 x weekly - 2 sets - 10 reps - Supine Bilateral Shoulder External Rotation with Resistance  - 2 x daily - 7 x weekly - 2 sets - 10 reps - Cat Cow  - 2 x daily - 7 x weekly - 1 sets - 10 reps - Supine Static Chest Stretch on Foam Roll  - 2 x daily - 7 x weekly - 3-5 minutes  hold - Thoracic Mobilization on Foam Roll  - 2 x daily - 7 x weekly - 1 sets - 10 reps - Sidelying Open Book Thoracic Rotation with Knee on Foam Roll  - 2 x daily - 7 x weekly - 1 sets - 10 reps - Seated Shoulder Abduction AAROM with Pulley Behind  - 1 x daily - 7 x weekly - 3 sets - 10 reps - Seated Shoulder Flexion AAROM with Pulley Behind  - 1 x daily -  7 x weekly - 3 sets - 10 reps - Seated Shoulder Internal Rotation AAROM with Pulley  - 1 x daily - 7 x weekly - 3 sets - 10 reps   HOME EXERCISE PROGRAM: Access Code: [email or web address]   ASSESSMENT:  CLINICAL IMPRESSION: Pt saw plastic surgeon and her incision is open under the Lt breast.  MD will recheck next week and she was advised not to do heavy lifting and to hold off on stretching of the Lt shoulder until reassessment.  Session focused on scapular strength, manual therapy to address thoracic mobility, scapular mobility and trigger points. Patient will benefit from skilled PT to address the below impairments and  improve overall function.    OBJECTIVE IMPAIRMENTS decreased ROM, decreased strength, increased edema, increased fascial restrictions, increased muscle spasms, postural dysfunction, and pain.   ACTIVITY LIMITATIONS bathing, dressing, reach over head, and hygiene/grooming  PARTICIPATION LIMITATIONS: meal prep, cleaning, driving, and community activity  PERSONAL FACTORS 1 comorbidity: breast surgery with complications   are also affecting patient's functional outcome.   REHAB POTENTIAL: Good  CLINICAL DECISION MAKING: Stable/uncomplicated  EVALUATION COMPLEXITY: Low   GOALS: Goals reviewed with patient? Yes  SHORT TERM GOALS: Target date: 09/20/2021   Be independent in initial HEP as allowed by MD Baseline:  Goal status: Met   2.  Report > or = to 30% reduction in Lt shoulder/scapular pain with ADLs and self-care Baseline: 09/05/21 Goal status: MET  3.  Demonstrate> or = to 110 degrees of Lt shoulder flexion A/ROM to improve overhead reaching  Baseline: 115 (09/12/21) Goal status: met   4.  Improve DASH to < or = to 40  Baseline: 22.72 Goal status: MET  LONG TERM GOALS: Target date: 10/18/2021  Be independent in advanced HEP Baseline:  Goal status: INITIAL  2.  Improve Lt shoulder A/ROM flexion to > or = to 135 degrees to improve overhead reaching  Baseline: 90 Goal status: INITIAL  3.  Improve DASH to < or = to 25 Baseline: 22 (09/22/21) Goal status: MET   4.  Report < or = to 3/10 Lt shoulder/scapular pain with use  Baseline:  Goal status: INITIAL  5.  Demonstrate Lt shoulder A/ROM IR to > or = to T10 to improve self-care Baseline: T8 (09/12/21) Goal status: Met      PLAN: PT FREQUENCY: 1-2x/week  PT DURATION: 8 weeks  PLANNED INTERVENTIONS: Therapeutic exercises, Therapeutic activity, Neuromuscular re-education, Patient/Family education, Joint mobilization, Dry Needling, Cryotherapy, Moist heat, scar mobilization, Taping, and Manual therapy  PLAN FOR  NEXT SESSION: Manual, gentle flexibility, DN 1x/wk. mobilize scapula and incisions. Hold stretching until pt is cleared for this.   Sigurd Sos, PT 10/05/21 4:17 PM   [facility] [address] [place], [place] [address] Phone # [phone] Fax [phone]     a

## 2021-10-07 ENCOUNTER — Other Ambulatory Visit: Payer: Self-pay

## 2021-10-07 ENCOUNTER — Encounter: Payer: Self-pay | Admitting: Hematology and Oncology

## 2021-10-07 DIAGNOSIS — C50412 Malignant neoplasm of upper-outer quadrant of left female breast: Secondary | ICD-10-CM

## 2021-10-07 NOTE — Progress Notes (Signed)
Referral placed in Epic and fax submitted to Dr Iran Planas

## 2021-10-10 ENCOUNTER — Ambulatory Visit: Payer: BC Managed Care – PPO

## 2021-10-10 ENCOUNTER — Telehealth: Payer: Self-pay

## 2021-10-10 NOTE — Telephone Encounter (Signed)
Pt left voice mail Friday, 8/11 reporting discharge from opening of her breast and saturated dressings x 2 days. She wanted to know if this was normal. She changed collagen out 8/10. Hermelinda Dellen, [place] as the provider on call to inform and he adv pt was a [facility] patient but would touch base with her and inform her plastics care team.

## 2021-10-11 ENCOUNTER — Ambulatory Visit (INDEPENDENT_AMBULATORY_CARE_PROVIDER_SITE_OTHER): Payer: BC Managed Care – PPO | Admitting: Student

## 2021-10-11 DIAGNOSIS — Z91199 Patient's noncompliance with other medical treatment and regimen due to unspecified reason: Secondary | ICD-10-CM

## 2021-10-12 ENCOUNTER — Ambulatory Visit (INDEPENDENT_AMBULATORY_CARE_PROVIDER_SITE_OTHER): Payer: BC Managed Care – PPO | Admitting: Surgical

## 2021-10-12 DIAGNOSIS — Z170 Estrogen receptor positive status [ER+]: Secondary | ICD-10-CM

## 2021-10-12 DIAGNOSIS — Z9012 Acquired absence of left breast and nipple: Secondary | ICD-10-CM

## 2021-10-12 DIAGNOSIS — C50412 Malignant neoplasm of upper-outer quadrant of left female breast: Secondary | ICD-10-CM

## 2021-10-12 DIAGNOSIS — Z9889 Other specified postprocedural states: Secondary | ICD-10-CM

## 2021-10-12 DIAGNOSIS — S21002A Unspecified open wound of left breast, initial encounter: Secondary | ICD-10-CM

## 2021-10-12 MED ORDER — CYCLOBENZAPRINE HCL 10 MG PO TABS
10.0000 mg | ORAL_TABLET | Freq: Every day | ORAL | 0 refills | Status: AC
Start: 2021-10-12 — End: 2021-11-01

## 2021-10-12 NOTE — Progress Notes (Signed)
Subjective:     Patient ID: Haley Evans, female    DOB: 04-15-1980, 41 y.o.   MRN: [id number]  Chief Complaint  Patient presents with   Follow-up    HPI: The patient is a 41 y.o. female here for follow-up on her bilateral breast reconstruction.  She currently has bilateral breast tissue expanders in place.  She initially underwent bilateral breast expander placement on 05/12/2021, subsequently developed a wound over the left breast and underwent left breast reconstruction with latissimus myocutaneous muscle flap and replacement of the left breast tissue expander on 07/18/2021.  Over the last 2 weeks she has developed erythema of the left inframammary fold as well as a resulting draining fluid collection.  She reports she has had some tenderness of the left breast and feeling of "fullness".  She denies any infectious symptoms today.  She is not currently on antibiotics.  She reports active drainage from the left breast.  She reports that she has been doing dry needling with physical therapy which has been very helpful for her back pain related to latissimus flap.  She also has been doing some stretching at home recommended by PT and this has been helping her quite a bit.  Review of Systems  Constitutional:  Positive for activity change. Negative for chills, fatigue and fever.  Respiratory: Negative.    Cardiovascular: Negative.   Skin:  Positive for wound.     Objective:   Vital Signs There were no vitals taken for this visit. Vital Signs and Nursing Note Reviewed Chaperone present Physical Exam Constitutional:      General: She is not in acute distress.    Appearance: Normal appearance. She is not ill-appearing or toxic-appearing.  HENT:     Head: Normocephalic and atraumatic.  Pulmonary:     Effort: Pulmonary effort is normal.  Chest:       Comments: Left breast with 2 x 2.5 mm opening along the medial inframammary fold with fibrinous tissue protruding through the  wound bed.  With palpation of the superior and medial breast, mild purulent and subsequent serosanguineous fluid drains from the wound.  She has mild amount of discomfort with this, but reports that the left breast does feel better after expression of the fluid.  No foul odor is noted.  Viable left latissimus myocutaneous muscle flap paddle is noted.  Incision is intact and well-healed.  There is no erythema or cellulitic changes of the left breast.    Right breast incisions intact, no erythema or cellulitic changes. Skin:    General: Skin is warm and dry.     Findings: No erythema.  Neurological:     General: No focal deficit present.     Mental Status: She is alert.  Psychiatric:        Mood and Affect: Mood normal.        Behavior: Behavior normal.       Assessment/Plan:     ICD-10-CM   1. S/P breast reconstruction  Z98.890     2. Acquired absence of left breast  Z90.12     3. Malignant neoplasm of upper-outer quadrant of left breast in female, estrogen receptor positive ([name])  C50.412    Z17.0     4. Open wound of left breast, initial encounter  S21.002A       Patient is a 41 year old female here for follow-up on her bilateral breast reconstruction.  She does have a new wound of her left breast that has developed over the  past few weeks with active drainage.  At her last appointment silver nitrate was used to help cauterize the hypergranulation tissue that was protruding through the inframammary fold wound bed.  Today I discussed with patient option for additional silver nitrate chemical cauterization versus trimming the hypergranulation tissue and protruding fibrinous tissue with pickups and scissors and subsequently using silver nitrate to control any additional granulation tissue.  Patient was agreeable to this.  Sterile technique was used.  Patient tolerated this well.  I was able to express approximately 10 cc of fluid from the left breast with palpation.  Patient had  relief from this.  Recommend keeping the area covered with gauze and allowing the area to continue to drain.  I do not feel as if antibiotics at this time will be necessary given the area is actively draining and there is no surrounding skin changes.    Will send refill for Flexeril for patient.  Recommend following up in 1 week.  Patient will see Dr. Marla Roe at her next appointment.  Pictures were obtained of the patient and placed in the chart with the patient's or guardian's permission.  Carola Rhine Denilson Salminen, PA-C 10/12/2021, 4:56 PM

## 2021-10-18 ENCOUNTER — Ambulatory Visit (INDEPENDENT_AMBULATORY_CARE_PROVIDER_SITE_OTHER): Payer: BC Managed Care – PPO | Admitting: Plastic Surgery

## 2021-10-18 ENCOUNTER — Encounter: Payer: Self-pay | Admitting: Plastic Surgery

## 2021-10-18 ENCOUNTER — Ambulatory Visit: Payer: BC Managed Care – PPO

## 2021-10-18 ENCOUNTER — Ambulatory Visit: Payer: BC Managed Care – PPO | Admitting: Student

## 2021-10-18 DIAGNOSIS — Z170 Estrogen receptor positive status [ER+]: Secondary | ICD-10-CM

## 2021-10-18 DIAGNOSIS — C50412 Malignant neoplasm of upper-outer quadrant of left female breast: Secondary | ICD-10-CM

## 2021-10-18 DIAGNOSIS — Z9012 Acquired absence of left breast and nipple: Secondary | ICD-10-CM

## 2021-10-18 NOTE — Progress Notes (Signed)
The patient is a 41 year old female here for evaluation and follow-up on her breast reconstruction.  She is a little bit tight on the left side.  I was able to remove 10 cc of serous drainage.  The wound seems to have healed and it is closed.  She is overall feeling better.  I like her to come back in 1-2 weeks for a fill.

## 2021-10-20 ENCOUNTER — Encounter: Payer: Self-pay | Admitting: Physical Medicine & Rehabilitation

## 2021-10-21 ENCOUNTER — Other Ambulatory Visit: Payer: Self-pay | Admitting: General Surgery

## 2021-10-21 DIAGNOSIS — M542 Cervicalgia: Secondary | ICD-10-CM

## 2021-10-24 ENCOUNTER — Encounter: Payer: Self-pay | Admitting: Plastic Surgery

## 2021-10-24 ENCOUNTER — Encounter: Payer: Self-pay | Admitting: Hematology and Oncology

## 2021-10-25 ENCOUNTER — Ambulatory Visit (INDEPENDENT_AMBULATORY_CARE_PROVIDER_SITE_OTHER): Payer: BC Managed Care – PPO | Admitting: Plastic Surgery

## 2021-10-25 ENCOUNTER — Encounter: Payer: Self-pay | Admitting: Plastic Surgery

## 2021-10-25 DIAGNOSIS — Z170 Estrogen receptor positive status [ER+]: Secondary | ICD-10-CM

## 2021-10-25 DIAGNOSIS — S21002A Unspecified open wound of left breast, initial encounter: Secondary | ICD-10-CM

## 2021-10-25 DIAGNOSIS — C50412 Malignant neoplasm of upper-outer quadrant of left female breast: Secondary | ICD-10-CM

## 2021-10-25 NOTE — H&P (View-Only) (Signed)
   Subjective:    Patient ID: Haley Evans, female    DOB: 07/28/1980, 41 y.o.   MRN: [id number]  The patient is a 41 year old female here for evaluation of her left breast.  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   She had bilateral mastectomies with expander placement in March. She had necrosis of a portion of her skin that required excision of the necrotic tissue.  She then required a latissimus muscle flap to the left breast which was done in May.  She has an opening of the left breast at the inframammary fold.  I can see the expander in one of the tabs.      Review of Systems  Constitutional: Negative.   Eyes: Negative.   Respiratory: Negative.    Cardiovascular: Negative.   Endocrine: Negative.        Objective:   Physical Exam Constitutional:      Appearance: Normal appearance.  Cardiovascular:     Rate and Rhythm: Normal rate.     Pulses: Normal pulses.  Skin:    Capillary Refill: Capillary refill takes less than 2 seconds.  Neurological:     Mental Status: She is alert and oriented to person, place, and time.  Psychiatric:        Mood and Affect: Mood normal.        Behavior: Behavior normal.        Thought Content: Thought content normal.        Judgment: Judgment normal.        Assessment & Plan:     ICD-10-CM   1. Open wound of left breast, initial encounter  S21.002A     2. Malignant neoplasm of upper-outer quadrant of left breast in female, estrogen receptor positive ([facility])  C50.412    Z17.0       Plan for going to the OR for exchange of the expander and advancement of the latissimus muscle flap with closure of the wound.

## 2021-10-25 NOTE — Progress Notes (Signed)
   Subjective:    Patient ID: Haley Evans, female    DOB: 1980/11/01, 41 y.o.   MRN: [id number]  The patient is a 41 year old female here for evaluation of her left breast.  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   She had bilateral mastectomies with expander placement in March. She had necrosis of a portion of her skin that required excision of the necrotic tissue.  She then required a latissimus muscle flap to the left breast which was done in May.  She has an opening of the left breast at the inframammary fold.  I can see the expander in one of the tabs.      Review of Systems  Constitutional: Negative.   Eyes: Negative.   Respiratory: Negative.    Cardiovascular: Negative.   Endocrine: Negative.        Objective:   Physical Exam Constitutional:      Appearance: Normal appearance.  Cardiovascular:     Rate and Rhythm: Normal rate.     Pulses: Normal pulses.  Skin:    Capillary Refill: Capillary refill takes less than 2 seconds.  Neurological:     Mental Status: She is alert and oriented to person, place, and time.  Psychiatric:        Mood and Affect: Mood normal.        Behavior: Behavior normal.        Thought Content: Thought content normal.        Judgment: Judgment normal.        Assessment & Plan:     ICD-10-CM   1. Open wound of left breast, initial encounter  S21.002A     2. Malignant neoplasm of upper-outer quadrant of left breast in female, estrogen receptor positive ([facility])  C50.412    Z17.0       Plan for going to the OR for exchange of the expander and advancement of the latissimus muscle flap with closure of the wound.

## 2021-10-26 ENCOUNTER — Other Ambulatory Visit: Payer: Self-pay | Admitting: Student

## 2021-10-26 ENCOUNTER — Encounter: Payer: Self-pay | Admitting: Physical Therapy

## 2021-10-26 ENCOUNTER — Encounter (HOSPITAL_COMMUNITY): Payer: Self-pay | Admitting: Plastic Surgery

## 2021-10-26 ENCOUNTER — Telehealth: Payer: Self-pay | Admitting: *Deleted

## 2021-10-26 DIAGNOSIS — Z9889 Other specified postprocedural states: Secondary | ICD-10-CM

## 2021-10-26 NOTE — Progress Notes (Signed)
PCP - Dr Eldridge Abrahams  Cardiologist - n/a  Chest x-ray - n/a EKG - n/a Stress Test - n/a ECHO - n/a Cardiac Cath - n/a  ICD Pacemaker/Loop - n/a  Sleep Study -  n/a CPAP - none  ERAS: Clear liquids til 12 noon DOS  Anesthesia review: Yes  STOP now taking any Aspirin (unless otherwise instructed by your surgeon), Aleve, Naproxen, Ibuprofen, Motrin, Advil, Goody's, BC's, all herbal medications, fish oil, and all vitamins.   Coronavirus Screening Do you have any of the following symptoms:  Cough yes/no: No Fever (>100.78F)  yes/no: No Runny nose yes/no: No Sore throat yes/no: No Difficulty breathing/shortness of breath  yes/no: No  Have you traveled in the last 14 days and where? yes/no: No  Patient verbalized understanding of instructions that were given via phone.

## 2021-10-26 NOTE — Telephone Encounter (Signed)
Insurance: [employer] CPT: [id number], [id number], [id number] Submitted via: Phone [phone] w/ Pleas Patricia Auth/Ref: [id number] Valid Dates: Pending  Clinicals have been sent via fax:  This message was sent via [place], a product from [employer] [email or web address]                    -------Fax Transmission Report-------  To:               Recipient at [phone] Subject:          [Secure] Clinicals for [place] [id number] - Darreld Mclean, M. Result:           The transmission was successful. Explanation:      All Pages Ok Pages Sent:       6 Connect Time:     4 minutes, 50 seconds Transmit Time:    10/26/2021 10:40 Transfer Rate:    14400 Status Code:      0000 Retry Count:      0 Job Id:           [id number] Unique Id:        [id number] Fax Line:         48 Fax Server:       [id number]

## 2021-10-26 NOTE — Progress Notes (Signed)
Called patient today regarding her MyChart message. We discussed her FMLA paperwork. I told the patient that we would work on getting that completed today.   Patient also states that she would like to continue physical therapy as this has been helping with her pain and ROM. Order for PT placed.   I discussed with the patient to not eat or drink anything after midnight tonight as her surgery is tomorrow. Patient acknowledged.

## 2021-10-27 ENCOUNTER — Ambulatory Visit (HOSPITAL_COMMUNITY)
Admission: RE | Admit: 2021-10-27 | Discharge: 2021-10-27 | Disposition: A | Discharge status: Home / Self Care | Payer: BC Managed Care – PPO | Attending: Plastic Surgery | Admitting: Plastic Surgery

## 2021-10-27 ENCOUNTER — Encounter (HOSPITAL_COMMUNITY)
Admission: RE | Disposition: A | Discharge status: Home / Self Care | Payer: Self-pay | Source: Home / Self Care | Attending: Plastic Surgery

## 2021-10-27 ENCOUNTER — Other Ambulatory Visit: Payer: Self-pay

## 2021-10-27 ENCOUNTER — Ambulatory Visit (HOSPITAL_COMMUNITY): Payer: BC Managed Care – PPO | Admitting: Physician Assistant

## 2021-10-27 ENCOUNTER — Encounter (HOSPITAL_COMMUNITY): Payer: Self-pay | Admitting: Plastic Surgery

## 2021-10-27 DIAGNOSIS — C50412 Malignant neoplasm of upper-outer quadrant of left female breast: Secondary | ICD-10-CM

## 2021-10-27 DIAGNOSIS — S21002A Unspecified open wound of left breast, initial encounter: Secondary | ICD-10-CM | POA: Insufficient documentation

## 2021-10-27 DIAGNOSIS — Z9013 Acquired absence of bilateral breasts and nipples: Secondary | ICD-10-CM | POA: Insufficient documentation

## 2021-10-27 DIAGNOSIS — R569 Unspecified convulsions: Secondary | ICD-10-CM | POA: Insufficient documentation

## 2021-10-27 DIAGNOSIS — Z87891 Personal history of nicotine dependence: Secondary | ICD-10-CM | POA: Diagnosis not present

## 2021-10-27 DIAGNOSIS — Z170 Estrogen receptor positive status [ER+]: Secondary | ICD-10-CM | POA: Insufficient documentation

## 2021-10-27 DIAGNOSIS — X58XXXA Exposure to other specified factors, initial encounter: Secondary | ICD-10-CM | POA: Insufficient documentation

## 2021-10-27 DIAGNOSIS — T8541XA Breakdown (mechanical) of breast prosthesis and implant, initial encounter: Secondary | ICD-10-CM

## 2021-10-27 HISTORY — PX: REMOVAL OF TISSUE EXPANDER: SHX6324

## 2021-10-27 LAB — CBC
HCT: 39.7 % (ref 36.0–46.0)
Hemoglobin: 13 g/dL (ref 12.0–15.0)
MCH: 30.3 pg (ref 26.0–34.0)
MCHC: 32.7 g/dL (ref 30.0–36.0)
MCV: 92.5 fL (ref 80.0–100.0)
Platelets: 338 10*3/uL (ref 150–400)
RBC: 4.29 MIL/uL (ref 3.87–5.11)
RDW: 12.2 % (ref 11.5–15.5)
WBC: 7.6 10*3/uL (ref 4.0–10.5)
nRBC: 0 % (ref 0.0–0.2)

## 2021-10-27 LAB — POCT PREGNANCY, URINE: Preg Test, Ur: NEGATIVE

## 2021-10-27 SURGERY — REMOVAL, TISSUE EXPANDER
Anesthesia: General | Site: Breast | Laterality: Left

## 2021-10-27 MED ORDER — MIDAZOLAM HCL 2 MG/2ML IJ SOLN
INTRAMUSCULAR | Status: DC | PRN
  Administered 2021-10-27: 2 mg via INTRAVENOUS

## 2021-10-27 MED ORDER — GABAPENTIN 300 MG PO CAPS
ORAL_CAPSULE | ORAL | Status: AC
  Filled 2021-10-27: qty 1

## 2021-10-27 MED ORDER — LIDOCAINE 2% (20 MG/ML) 5 ML SYRINGE
INTRAMUSCULAR | Status: DC | PRN
  Administered 2021-10-27: 80 mg via INTRAVENOUS

## 2021-10-27 MED ORDER — 0.9 % SODIUM CHLORIDE (POUR BTL) OPTIME
TOPICAL | Status: DC | PRN
  Administered 2021-10-27: 1000 mL

## 2021-10-27 MED ORDER — OXYCODONE HCL 5 MG PO TABS: 5.0000 mg | ORAL_TABLET | Freq: Once | ORAL | Status: AC | PRN

## 2021-10-27 MED ORDER — PROPOFOL 10 MG/ML IV BOLUS
INTRAVENOUS | Status: AC
  Filled 2021-10-27: qty 20

## 2021-10-27 MED ORDER — HYDROCODONE-ACETAMINOPHEN 5-325 MG PO TABS: 1.0000 | ORAL_TABLET | Freq: Three times a day (TID) | ORAL | 0 refills | Status: AC | PRN

## 2021-10-27 MED ORDER — DIAZEPAM 2 MG PO TABS: 2.0000 mg | ORAL_TABLET | Freq: Two times a day (BID) | ORAL | 0 refills | Status: DC | PRN

## 2021-10-27 MED ORDER — GABAPENTIN 300 MG PO CAPS
300.0000 mg | ORAL_CAPSULE | Freq: Once | ORAL | Status: AC
  Administered 2021-10-27: 300 mg via ORAL

## 2021-10-27 MED ORDER — FENTANYL CITRATE (PF) 100 MCG/2ML IJ SOLN
INTRAMUSCULAR | Status: AC
  Filled 2021-10-27: qty 2

## 2021-10-27 MED ORDER — PROPOFOL 10 MG/ML IV BOLUS
INTRAVENOUS | Status: DC | PRN
  Administered 2021-10-27: 200 mg via INTRAVENOUS
  Administered 2021-10-27: 30 mg via INTRAVENOUS

## 2021-10-27 MED ORDER — FENTANYL CITRATE (PF) 100 MCG/2ML IJ SOLN
25.0000 ug | INTRAMUSCULAR | Status: DC | PRN
  Administered 2021-10-27 (×3): 50 ug via INTRAVENOUS

## 2021-10-27 MED ORDER — ONDANSETRON HCL 4 MG/2ML IJ SOLN
INTRAMUSCULAR | Status: AC
  Filled 2021-10-27: qty 2

## 2021-10-27 MED ORDER — CEPHALEXIN 500 MG PO CAPS: 500.0000 mg | ORAL_CAPSULE | Freq: Four times a day (QID) | ORAL | 0 refills | Status: AC

## 2021-10-27 MED ORDER — BUPIVACAINE-EPINEPHRINE (PF) 0.25% -1:200000 IJ SOLN
INTRAMUSCULAR | Status: AC
  Filled 2021-10-27: qty 30

## 2021-10-27 MED ORDER — CELECOXIB 200 MG PO CAPS
200.0000 mg | ORAL_CAPSULE | Freq: Once | ORAL | Status: AC
  Administered 2021-10-27: 200 mg via ORAL

## 2021-10-27 MED ORDER — OXYCODONE HCL 5 MG/5ML PO SOLN
ORAL | Status: AC
  Filled 2021-10-27: qty 5

## 2021-10-27 MED ORDER — FENTANYL CITRATE (PF) 250 MCG/5ML IJ SOLN
INTRAMUSCULAR | Status: DC | PRN
  Administered 2021-10-27 (×4): 50 ug via INTRAVENOUS

## 2021-10-27 MED ORDER — PHENYLEPHRINE 80 MCG/ML (10ML) SYRINGE FOR IV PUSH (FOR BLOOD PRESSURE SUPPORT)
PREFILLED_SYRINGE | INTRAVENOUS | Status: AC
  Filled 2021-10-27: qty 10

## 2021-10-27 MED ORDER — SODIUM CHLORIDE 0.9 % IV SOLN
Freq: Once | INTRAVENOUS | Status: AC
  Administered 2021-10-27: 500 mL
  Filled 2021-10-27: qty 10

## 2021-10-27 MED ORDER — ONDANSETRON HCL 4 MG/2ML IJ SOLN
INTRAMUSCULAR | Status: DC | PRN
  Administered 2021-10-27: 4 mg via INTRAVENOUS

## 2021-10-27 MED ORDER — DEXMEDETOMIDINE (PRECEDEX) IN NS 20 MCG/5ML (4 MCG/ML) IV SYRINGE
PREFILLED_SYRINGE | INTRAVENOUS | Status: DC | PRN
  Administered 2021-10-27: 8 ug via INTRAVENOUS

## 2021-10-27 MED ORDER — LIDOCAINE 2% (20 MG/ML) 5 ML SYRINGE
INTRAMUSCULAR | Status: AC
  Filled 2021-10-27: qty 5

## 2021-10-27 MED ORDER — ONDANSETRON HCL 4 MG PO TABS: 4.0000 mg | ORAL_TABLET | Freq: Three times a day (TID) | ORAL | 0 refills | Status: AC | PRN

## 2021-10-27 MED ORDER — HEMOSTATIC AGENTS (NO CHARGE) OPTIME
TOPICAL | Status: DC | PRN
  Administered 2021-10-27 (×2): 1 via TOPICAL

## 2021-10-27 MED ORDER — FENTANYL CITRATE (PF) 100 MCG/2ML IJ SOLN
50.0000 ug | Freq: Once | INTRAMUSCULAR | Status: AC
  Administered 2021-10-27: 50 ug via INTRAVENOUS
  Filled 2021-10-27: qty 2

## 2021-10-27 MED ORDER — CEFAZOLIN SODIUM-DEXTROSE 2-3 GM-%(50ML) IV SOLR
INTRAVENOUS | Status: DC | PRN
  Administered 2021-10-27: 2 g via INTRAVENOUS

## 2021-10-27 MED ORDER — HYDROMORPHONE HCL 1 MG/ML IJ SOLN
0.2500 mg | INTRAMUSCULAR | Status: DC | PRN
  Administered 2021-10-27: 0.5 mg via INTRAVENOUS

## 2021-10-27 MED ORDER — ORAL CARE MOUTH RINSE: 15.0000 mL | Freq: Once | OROMUCOSAL | Status: AC

## 2021-10-27 MED ORDER — CELECOXIB 200 MG PO CAPS
ORAL_CAPSULE | ORAL | Status: AC
  Filled 2021-10-27: qty 1

## 2021-10-27 MED ORDER — DEXAMETHASONE SODIUM PHOSPHATE 10 MG/ML IJ SOLN
INTRAMUSCULAR | Status: AC
  Filled 2021-10-27: qty 1

## 2021-10-27 MED ORDER — MIDAZOLAM HCL 2 MG/2ML IJ SOLN
INTRAMUSCULAR | Status: AC
  Filled 2021-10-27: qty 2

## 2021-10-27 MED ORDER — FENTANYL CITRATE (PF) 250 MCG/5ML IJ SOLN
INTRAMUSCULAR | Status: AC
  Filled 2021-10-27: qty 5

## 2021-10-27 MED ORDER — DEXAMETHASONE SODIUM PHOSPHATE 10 MG/ML IJ SOLN
INTRAMUSCULAR | Status: DC | PRN
  Administered 2021-10-27: 5 mg via INTRAVENOUS

## 2021-10-27 MED ORDER — ACETAMINOPHEN 500 MG PO TABS
ORAL_TABLET | ORAL | Status: AC
  Filled 2021-10-27: qty 2

## 2021-10-27 MED ORDER — CHLORHEXIDINE GLUCONATE 0.12 % MT SOLN
15.0000 mL | Freq: Once | OROMUCOSAL | Status: AC
  Administered 2021-10-27: 15 mL via OROMUCOSAL
  Filled 2021-10-27: qty 15

## 2021-10-27 MED ORDER — ONDANSETRON HCL 4 MG/2ML IJ SOLN: 4.0000 mg | Freq: Four times a day (QID) | INTRAMUSCULAR | Status: DC | PRN

## 2021-10-27 MED ORDER — OXYCODONE HCL 5 MG/5ML PO SOLN
5.0000 mg | Freq: Once | ORAL | Status: AC | PRN
  Administered 2021-10-27: 5 mg via ORAL

## 2021-10-27 MED ORDER — ACETAMINOPHEN 500 MG PO TABS
1000.0000 mg | ORAL_TABLET | Freq: Once | ORAL | Status: AC
  Administered 2021-10-27: 1000 mg via ORAL

## 2021-10-27 MED ORDER — HYDROMORPHONE HCL 1 MG/ML IJ SOLN
INTRAMUSCULAR | Status: AC
  Filled 2021-10-27: qty 1

## 2021-10-27 MED ORDER — BUPIVACAINE-EPINEPHRINE (PF) 0.25% -1:200000 IJ SOLN
INTRAMUSCULAR | Status: DC | PRN
  Administered 2021-10-27: 10 mL

## 2021-10-27 MED ORDER — LACTATED RINGERS IV SOLN: INTRAVENOUS | Status: DC

## 2021-10-27 SURGICAL SUPPLY — 62 items
ADH SKN CLS APL DERMABOND .7 (GAUZE/BANDAGES/DRESSINGS)
AGENT HMST 10 BLLW SHRT CANN (HEMOSTASIS) ×4
APL PRP STRL LF DISP 70% ISPRP (MISCELLANEOUS) ×2
APPLIER CLIP 9.375 MED OPEN (MISCELLANEOUS) ×2
APR CLP MED 9.3 20 MLT OPN (MISCELLANEOUS) ×2
BAG DECANTER FOR FLEXI CONT (MISCELLANEOUS) ×3 IMPLANT
BINDER BREAST XLRG (GAUZE/BANDAGES/DRESSINGS) ×1 IMPLANT
BIOPATCH RED 1 DISK 7.0 (GAUZE/BANDAGES/DRESSINGS) ×5 IMPLANT
CANISTER SUCT 3000ML PPV (MISCELLANEOUS) ×3 IMPLANT
CHLORAPREP W/TINT 26 (MISCELLANEOUS) ×3 IMPLANT
CLIP APPLIE 9.375 MED OPEN (MISCELLANEOUS) ×3 IMPLANT
CLOSURE STERI STRIP 1/2 X4 (GAUZE/BANDAGES/DRESSINGS) ×1 IMPLANT
COVER SURGICAL LIGHT HANDLE (MISCELLANEOUS) ×3 IMPLANT
DERMABOND ADVANCED (GAUZE/BANDAGES/DRESSINGS)
DERMABOND ADVANCED .7 DNX12 (GAUZE/BANDAGES/DRESSINGS) ×4 IMPLANT
DRAIN CHANNEL 19F RND (DRAIN) ×5 IMPLANT
DRAPE HALF SHEET 40X57 (DRAPES) ×6 IMPLANT
DRAPE ORTHO SPLIT 77X108 STRL (DRAPES) ×4
DRAPE SURG ORHT 6 SPLT 77X108 (DRAPES) ×6 IMPLANT
DRSG MEPILEX BORDER 4X8 (GAUZE/BANDAGES/DRESSINGS) ×2 IMPLANT
DRSG OPSITE POSTOP 4X8 (GAUZE/BANDAGES/DRESSINGS) ×1 IMPLANT
DRSG PAD ABDOMINAL 8X10 ST (GAUZE/BANDAGES/DRESSINGS) ×4 IMPLANT
DRSG TEGADERM 4X4.75 (GAUZE/BANDAGES/DRESSINGS) ×1 IMPLANT
ELECT BLADE 4.0 EZ CLEAN MEGAD (MISCELLANEOUS) ×4
ELECT REM PT RETURN 9FT ADLT (ELECTROSURGICAL) ×2
ELECTRODE BLDE 4.0 EZ CLN MEGD (MISCELLANEOUS) ×4 IMPLANT
ELECTRODE REM PT RTRN 9FT ADLT (ELECTROSURGICAL) ×3 IMPLANT
EVACUATOR SILICONE 100CC (DRAIN) ×5 IMPLANT
GAUZE SPONGE 4X4 12PLY STRL (GAUZE/BANDAGES/DRESSINGS) ×4 IMPLANT
GLOVE BIO SURGEON STRL SZ 6.5 (GLOVE) ×7 IMPLANT
GOWN STRL REUS W/ TWL LRG LVL3 (GOWN DISPOSABLE) ×7 IMPLANT
GOWN STRL REUS W/TWL LRG LVL3 (GOWN DISPOSABLE) ×6
HEMOSTAT HEMOBLAST BELLOWS (HEMOSTASIS) ×2 IMPLANT
IMPL EXPANDER BREAST 455CC (Breast) ×1 IMPLANT
IMPLANT EXPANDER BREAST 455CC (Breast) ×2 IMPLANT
KIT BASIN OR (CUSTOM PROCEDURE TRAY) ×3 IMPLANT
KIT FILL ASEPTIC TRANSFER (MISCELLANEOUS) ×1 IMPLANT
NDL 18GX1X1/2 (RX/OR ONLY) (NEEDLE) IMPLANT
NDL 21 GA WING INFUSION (NEEDLE) IMPLANT
NDL HYPO 25GX1X1/2 BEV (NEEDLE) ×2 IMPLANT
NEEDLE 18GX1X1/2 (RX/OR ONLY) (NEEDLE) ×2 IMPLANT
NEEDLE 21 GA WING INFUSION (NEEDLE) ×2 IMPLANT
NEEDLE HYPO 25GX1X1/2 BEV (NEEDLE) ×2 IMPLANT
NS IRRIG 1000ML POUR BTL (IV SOLUTION) ×5 IMPLANT
PACK GENERAL/GYN (CUSTOM PROCEDURE TRAY) ×3 IMPLANT
PAD ARMBOARD 7.5X6 YLW CONV (MISCELLANEOUS) ×3 IMPLANT
PIN SAFETY STERILE (MISCELLANEOUS) ×4 IMPLANT
SPIKE FLUID TRANSFER (MISCELLANEOUS) ×2 IMPLANT
STAPLER VISISTAT 35W (STAPLE) ×2 IMPLANT
STRIP CLOSURE SKIN 1/2X4 (GAUZE/BANDAGES/DRESSINGS) IMPLANT
SUT MNCRL AB 3-0 PS2 18 (SUTURE) ×7 IMPLANT
SUT MON AB 4-0 PS1 27 (SUTURE) ×4 IMPLANT
SUT MON AB 5-0 PS2 18 (SUTURE) ×4 IMPLANT
SUT PDS AB 3-0 SH 27 (SUTURE) ×3 IMPLANT
SUT SILK 3 0 (SUTURE) ×2
SUT SILK 3-0 FS1 18XBRD (SUTURE) ×5 IMPLANT
SYR BULB IRRIG 60ML STRL (SYRINGE) ×2 IMPLANT
SYR CONTROL 10ML LL (SYRINGE) ×3 IMPLANT
TOWEL GREEN STERILE (TOWEL DISPOSABLE) ×3 IMPLANT
TOWEL GREEN STERILE FF (TOWEL DISPOSABLE) ×2 IMPLANT
TUBE CONNECTING 12X1/4 (SUCTIONS) ×2 IMPLANT
YANKAUER SUCT BULB TIP NO VENT (SUCTIONS) ×3 IMPLANT

## 2021-10-27 NOTE — Anesthesia Postprocedure Evaluation (Signed)
Anesthesia Post Note  Patient: Haley Evans  Procedure(s) Performed: Left breast seroma drainage with tissue expander exchange (Left: Breast)     Patient location during evaluation: PACU Anesthesia Type: General Level of consciousness: sedated and patient cooperative Pain management: pain level controlled Vital Signs Assessment: post-procedure vital signs reviewed and stable Respiratory status: spontaneous breathing Cardiovascular status: stable Anesthetic complications: no   No notable events documented.  Last Vitals:  Vitals:   10/27/21 1900 10/27/21 1915  BP: 117/86 (!) 132/102  Pulse: 78 87  Resp: 17 15  Temp:  36.8 C  SpO2: 94% 96%    Last Pain:  Vitals:   10/27/21 1915  TempSrc:   PainSc: [place]

## 2021-10-27 NOTE — Transfer of Care (Signed)
Immediate Anesthesia Transfer of Care Note  Patient: Haley Evans  Procedure(s) Performed: Left breast seroma drainage with tissue expander exchange (Left: Breast)  Patient Location: PACU  Anesthesia Type:General  Level of Consciousness: awake, alert  and oriented  Airway & Oxygen Therapy: Patient Spontanous Breathing  Post-op Assessment: Report given to RN, Post -op Vital signs reviewed and stable and Patient moving all extremities X 4  Post vital signs: Reviewed and stable  Last Vitals:  Vitals Value Taken Time  BP 135/80 10/27/21 1745  Temp    Pulse 104 10/27/21 1745  Resp 18 10/27/21 1745  SpO2 97 % 10/27/21 1745  Vitals shown include unvalidated device data.  Last Pain:  Vitals:   10/27/21 1434  TempSrc:   PainSc: 9          Complications: No notable events documented.

## 2021-10-27 NOTE — Anesthesia Procedure Notes (Signed)
Procedure Name: LMA Insertion Date/Time: 10/27/2021 4:29 PM  Performed by: Rande Brunt, CRNAPre-anesthesia Checklist: Patient identified, Emergency Drugs available, Suction available and Patient being monitored Patient Re-evaluated:Patient Re-evaluated prior to induction Oxygen Delivery Method: Circle System Utilized Preoxygenation: Pre-oxygenation with 100% oxygen Induction Type: IV induction Ventilation: Mask ventilation without difficulty LMA: LMA inserted LMA Size: 4.0 Number of attempts: 1 Airway Equipment and Method: Bite block Placement Confirmation: positive ETCO2 Tube secured with: Tape Dental Injury: Teeth and Oropharynx as per pre-operative assessment

## 2021-10-27 NOTE — Anesthesia Preprocedure Evaluation (Addendum)
Anesthesia Evaluation  Patient identified by MRN, date of birth, ID band Patient awake    Reviewed: Allergy & Precautions, H&P , NPO status , Patient's Chart, lab work & pertinent test results  Airway Mallampati: II  TM Distance: >3 FB Neck ROM: Full    Dental  (+) Teeth Intact, Dental Advisory Given   Pulmonary Patient abstained from smoking., former smoker,    breath sounds clear to auscultation       Cardiovascular negative cardio ROS   Rhythm:Regular Rate:Normal     Neuro/Psych Seizures -,  PSYCHIATRIC DISORDERS Anxiety Depression Bipolar Disorder    GI/Hepatic   Endo/Other    Renal/GU      Musculoskeletal   Abdominal (+) - obese,   Peds  Hematology   Anesthesia Other Findings   Reproductive/Obstetrics Breast CA                            Anesthesia Physical Anesthesia Plan  ASA: 2  Anesthesia Plan: General   Post-op Pain Management: Tylenol PO (pre-op)*, Celebrex PO (pre-op)*, Gabapentin PO (pre-op)* and Ketamine IV*   Induction: Intravenous  PONV Risk Score and Plan: 3 and Ondansetron, Dexamethasone, Midazolam and Treatment may vary due to age or medical condition  Airway Management Planned: LMA  Additional Equipment:   Intra-op Plan:   Post-operative Plan: Extubation in OR  Informed Consent: I have reviewed the patients History and Physical, chart, labs and discussed the procedure including the risks, benefits and alternatives for the proposed anesthesia with the patient or authorized representative who has indicated his/her understanding and acceptance.     Dental advisory given  Plan Discussed with: CRNA, Anesthesiologist and Surgeon  Anesthesia Plan Comments:        Anesthesia Quick Evaluation

## 2021-10-27 NOTE — Interval H&P Note (Signed)
History and Physical Interval Note:  10/27/2021 3:57 PM  Petra Wurster  has presented today for surgery, with the diagnosis of Seroma Left Breast.  The various methods of treatment have been discussed with the patient and family. After consideration of risks, benefits and other options for treatment, the patient has consented to  Procedure(s): Left breast seroma drainage with expander exchange (Left) as a surgical intervention.  The patient's history has been reviewed, patient examined, no change in status, stable for surgery.  I have reviewed the patient's chart and labs.  Questions were answered to the patient's satisfaction.     Loel Lofty Narcissa Melder

## 2021-10-27 NOTE — Op Note (Signed)
Op report    DATE OF OPERATION:  10/27/2021  LOCATION: [facility]  SURGICAL DIVISION: Plastic Surgery  PREOPERATIVE DIAGNOSES:  1. Breast cancer.  2. Exposure of left breast expander   POSTOPERATIVE DIAGNOSES:  Same as preoperative diagnosis  PROCEDURE:  1. Removal and replacement of left breast expander.   SURGEON: Avalyn Molino Sanger Shalandra Leu, DO  ASSISTANT: Donnamarie Rossetti, PA  ANESTHESIA:  General.   COMPLICATIONS: None.   IMPLANTS: Left - Mentor 455 cc.  100 cc of injectable saline placed in the expander.  INDICATIONS FOR PROCEDURE:  The patient, Haley Evans, is a 41 y.o. female born on [birth year], is here for removal of an exposed left breast expander with replacement. MRN: [id number]  CONSENT:  Informed consent was obtained directly from the patient. Risks, benefits and alternatives were fully discussed. Specific risks including but not limited to bleeding, infection, hematoma, seroma, scarring, pain, implant infection, implant extrusion, capsular contracture, asymmetry, wound healing problems, and need for further surgery were all discussed. The patient did have an ample opportunity to have her questions answered to her satisfaction.   DESCRIPTION OF PROCEDURE:  The patient was taken to the operating room. SCDs were placed and IV antibiotics were given. The patient's chest was prepped and draped in a sterile fashion. A time out was performed and the expander to be used was identified.  Left:  The previous incision at the inframammary fold was opened with the #10 blade.  The bovie was used to dissect to the expander.  The expander was removed.  The chest wall tissue was very friable.  I used hemoblast to help with the bleeding.  I tried to release the capsule.  I was able to release it all but laterally.  The tissue was too friable and I was concerned about bleeding or harming the latissimus muscle flap.   The pocket was irrigated with  antibiotic solution and hemostasis was achieved with electrocautery.  The expander was placed and secured with one tab using a 3-0 PDS.  The drain was placed and secured to the chest with the 3-0 Silk.   The expander was prepared according to the manufacture guidelines, the air evacuated.  The deep layers were closed with 3-0 PDS followed by 3-0 Monocryl.  The skin was closed with 4-0 Monocryl and then dermabond was applied.  The ABDs and breast binder were placed.  The patient tolerated the procedure well and there were no complications.  The patient was allowed to wake from anesthesia and taken to the recovery room in satisfactory condition.    The advanced practice practitioner (APP) assisted throughout the case.  The APP was essential in retraction and counter traction when needed to make the case progress smoothly.  This retraction and assistance made it possible to see the tissue plans for the procedure.  The assistance was needed for blood control, tissue re-approximation and assisted with closure of the incision site.

## 2021-10-27 NOTE — Discharge Instructions (Addendum)
INSTRUCTIONS FOR AFTER BREAST SURGERY   You will likely have some questions about what to expect following your operation.  The following information will help you and your family understand what to expect when you are discharged from the hospital.  Following these guidelines will help ensure a smooth recovery and reduce risks of complications.  Postoperative instructions include information on: diet, wound care, medications and physical activity.  AFTER SURGERY Expect to go home after the procedure.  In some cases, you may need to spend one night in the hospital for observation.  DIET Breast surgery does not require a specific diet.  However, the healthier you eat the better your body can start healing. It is important to increasing your protein intake.  This means limiting the foods with sugar and carbohydrates.  Focus on vegetables and some meat.  If you have any liposuction during your procedure be sure to drink water.  If your urine is bright yellow, then it is concentrated, and you need to drink more water.  As a general rule after surgery, you should have 8 ounces of water every hour while awake.  If you find you are persistently nauseated or unable to take in liquids let us know.  NO TOBACCO USE or EXPOSURE.  This will slow your healing process and increase the risk of a wound.  WOUND CARE Leave the binder on for 3 days . Use fragrance free soap.   After 3 days you can remove the binder to shower. Once dry apply binder or sports bra.  Use a mild soap like Dial, Dove and [place]. You may have Topifoam or Lipofoam on.  It is soft and spongy and helps keep you from getting creases if you have liposuction.  This can be removed before the shower and then replaced.  If you need more it is available on [employer] (Lipofoam). If you have steri-strips / tape directly attached to your skin leave them in place. It is OK to get these wet.   No baths, pools or hot tubs for four weeks. We close your incision to  leave the smallest and best-looking scar. No ointment or creams on your incisions until given the go ahead.  Especially not Neosporin (Too many skin reactions with this one).  A few weeks after surgery you can use Mederma and start massaging the scar. We ask you to wear your binder or sports bra for the first 6 weeks around the clock, including while sleeping. This provides added comfort and helps reduce the fluid accumulation at the surgery site.  ACTIVITY No heavy lifting until cleared by the doctor.  This usually means no more than a half-gallon of milk.  It is OK to walk and climb stairs. In fact, moving your legs is very important to decrease your risk of a blood clot.  It will also help keep you from getting deconditioned.  Every 1 to 2 hours get up and walk for 5 minutes. This will help with a quicker recovery back to normal.  Let pain be your guide so you don't do too much.  This is not the time for spring cleaning and don't plan on taking care of anyone else.  This time is for you to recover,  You will be more comfortable if you sleep and rest with your head elevated either with a few pillows under you or in a recliner.  No stomach sleeping for a three months.  WORK Everyone returns to work at different times. As a  rough guide, most people take at least 1 - 2 weeks off prior to returning to work. If you need documentation for your job, bring the forms to your postoperative follow up visit.  DRIVING Arrange for someone to bring you home from the hospital.  You may be able to drive a few days after surgery but not while taking any narcotics or valium.  BOWEL MOVEMENTS Constipation can occur after anesthesia and while taking pain medication.  It is important to stay ahead for your comfort.  We recommend taking Milk of Magnesia (2 tablespoons; twice a day) while taking the pain pills.  MEDICATIONS You may be prescribed should start after surgery At your preoperative visit for you history and  physical you may have been given the following medications: An antibiotic: Start this medication when you get home and take according to the instructions on the bottle. Zofran 4 mg:  This is to treat nausea and vomiting.  You can take this every 6 hours as needed and only if needed. Valium 2 mg: This is for muscle tightness if you have an implant or expander. This will help relax your muscle which also helps with pain control.  This can be taken every 12 hours as needed. Don't drive after taking this medication. Norco (hydrocodone/acetaminophen) 5/325 mg:  This is only to be used after you have taken the motrin or the tylenol. Every 8 hours as needed.   Over the counter Medication to take: Ibuprofen (Motrin) 600 mg:  Take this every 6 hours.  If you have additional pain then take 500 mg of the tylenol every 8 hours.  Only take the Norco after you have tried these two. Miralax or stool softener of choice: Take this according to the bottle if you take the [place] Call your surgeon's office if any of the following occur: Fever 101 degrees F or greater Excessive bleeding or fluid from the incision site. Pain that increases over time without aid from the medications Redness, warmth, or pus draining from incision sites Persistent nausea or inability to take in liquids Severe misshapen area that underwent the operation.  [facility] Plastic Surgery Specialist  What is the benefit of having a drain?  During surgery your tissue layers are separated.  This raw surface stimulates your body to fill the space with serous fluid.  This is normal but you don't want that fluid to collect and prevent healing.  A fluid collection can also become infected.  The Jackson-Pratt (JP) drain is used to eliminate this collection of fluid and allow the tissue to heal together.    Jackson-Pratt (JP) bulb    How to care for your drainage and suction unit at home Your drainage catheter will be connected to a  collection device. The vacuum caused when the device is compressed allows drainage to collect in the device.    Wash your hands with soap and water before and after touching the system. Empty the JP drain every 12 hours once you get home from your procedure. Record the fluid amount on the record sheet included. Start with stripping the drain tube to push the clots or excess fluid to the bulb.  Do this by pinching the tube with one hand near your skin.  Then with the other hand squeeze the tubing and work it toward the bulb.  This should be done several times a day.  This may collapse the tube which will correct on its own.   Use a  safety pin to attach your collection device to your clothing so there is no tension on the insertion site.   If you have drainage at the skin insertion site, you can apply a gauze dressing and secure it with tape. If the drain falls out, apply a gauze dressing over the drain insertion site and secure with tape.   To empty the collection device:   Release the stopper on the top of the collection unit (bulb).  Pour contents into a measuring container such as a plastic medicine cup.  Record the day and amount of drainage on the attached sheet. This should be done at least twice a day.    To compress the Jackson-Pratt Bulb:  Release the stopper at the top of the bulb. Squeeze the bulb tightly in your fist, squeezing air out of the bulb.  Replace the stopper while the bulb is compressed.  Be careful not to spill the contents when squeezing the bulb. The drainage will start bright red and turn to pink and then yellow with time. IMPORTANT: If the bulb is not squeezed before adding the stopper it will not draw out the fluid.  Care for the JP drain site and your skin daily:  You may shower three days after surgery. Secure the drain to a ribbon or cloth around your waist while showering so it does not pull out while showering. Be sure your hands are cleaned with soap and  water. Use a clean wet cotton swab to clean the skin around the drain site.  Use another cotton swab to place Vaseline or antibiotic ointment on the skin around the drain.     Contact your physician if any of the following occur:  The fluid in the bulb becomes cloudy. Your temperature is greater than 101.4.  The incision opens. If you have drainage at the skin insertion site, you can apply a gauze dressing and secure it with tape. If the drain falls out, apply a gauze dressing over the drain insertion site and secure with tape.  You will usually have more drainage when you are active than while you rest or are asleep. If the drainage increases significantly or is bloody call the physician                             Bring this record with you to each office visit Date  Drainage Volume  Date   Drainage volume

## 2021-10-28 ENCOUNTER — Encounter (HOSPITAL_COMMUNITY): Payer: Self-pay | Admitting: Plastic Surgery

## 2021-10-28 ENCOUNTER — Telehealth: Payer: Self-pay

## 2021-10-28 NOTE — Telephone Encounter (Signed)
Returned patients call. LMVM,  Advise to wait after  3 days from surgery to go into a compression bra.

## 2021-11-01 ENCOUNTER — Encounter: Payer: Self-pay | Admitting: Physical Therapy

## 2021-11-02 ENCOUNTER — Ambulatory Visit: Payer: BC Managed Care – PPO

## 2021-11-04 ENCOUNTER — Ambulatory Visit (INDEPENDENT_AMBULATORY_CARE_PROVIDER_SITE_OTHER): Payer: BC Managed Care – PPO | Admitting: Plastic Surgery

## 2021-11-04 ENCOUNTER — Encounter: Payer: Self-pay | Admitting: Plastic Surgery

## 2021-11-04 ENCOUNTER — Encounter: Payer: Self-pay | Admitting: Physical Therapy

## 2021-11-04 DIAGNOSIS — Z170 Estrogen receptor positive status [ER+]: Secondary | ICD-10-CM

## 2021-11-04 DIAGNOSIS — C50412 Malignant neoplasm of upper-outer quadrant of left female breast: Secondary | ICD-10-CM

## 2021-11-04 DIAGNOSIS — Z9012 Acquired absence of left breast and nipple: Secondary | ICD-10-CM

## 2021-11-04 NOTE — Addendum Note (Signed)
Addended by: Wallace Going on: 11/04/2021 04:58 PM   Modules accepted: Orders

## 2021-11-04 NOTE — Progress Notes (Signed)
   Subjective:    Patient ID: Haley Evans, female    DOB: May 15, 1980, 41 y.o.   MRN: [id number]  The patient is a 41 years old female here for follow up on her left breast expander placement.  The expander was filled with 50 cc in the OR.  The drain is putting out ~ 50 cc in a day.  So far the incision is closed and no concerns.       Review of Systems  Constitutional: Negative.   Eyes: Negative.   Respiratory: Negative.    Cardiovascular: Negative.   Gastrointestinal: Negative.   Endocrine: Negative.   Genitourinary: Negative.        Objective:   Physical Exam Constitutional:      Appearance: Normal appearance.  Cardiovascular:     Rate and Rhythm: Normal rate.     Pulses: Normal pulses.  Skin:    Capillary Refill: Capillary refill takes less than 2 seconds.  Neurological:     Mental Status: She is alert.  Psychiatric:        Mood and Affect: Mood normal.        Behavior: Behavior normal.           Assessment & Plan:     ICD-10-CM   1. Malignant neoplasm of upper-outer quadrant of left breast in female, estrogen receptor positive ([place])  C50.412    Z17.0     2. Acquired absence of left breast  Z90.12       We placed injectable saline in the Expander using a sterile technique: Left: 50 cc for a total of 100 / 350 cc  Follow up next week for more fill and drain removal.

## 2021-11-07 ENCOUNTER — Telehealth: Payer: Self-pay

## 2021-11-07 ENCOUNTER — Ambulatory Visit (INDEPENDENT_AMBULATORY_CARE_PROVIDER_SITE_OTHER): Payer: BC Managed Care – PPO | Admitting: Physician Assistant

## 2021-11-07 DIAGNOSIS — Z9889 Other specified postprocedural states: Secondary | ICD-10-CM

## 2021-11-07 NOTE — Progress Notes (Signed)
This is a 41 year old female seen in our office for follow-up evaluation status post left breast expander placement on 10/27/2021.  The patient notes that postoperatively she was doing very well, she was seen in our office on 11/04/2021 and had an expander fill.  She notes that yesterday she started developing rather exquisite pain at the drain site in the infra mammary fold.  She denies any infectious symptoms including fever chills nausea or vomiting, no redness or discharge.  She notes that her JP drain has been putting out approximately 10 cc/day over the last 3 days.  Chaperone present.  On exam left expander in place incision covered in Steri-Strips they are clean dry and intact no surrounding redness warmth or palpable fluid collections.  JP bulb with approximately 10 cc of serous output.  Given the patient's pain at the drain site and her minimal output I do find it is reasonable to remove the drain as its been in for 2 weeks.  She has no signs of infectious etiology, I am optimistic that removing the drain will help her pain.  She will watch for any signs of infection including fever chills nausea vomiting redness or drainage.  She will return immediately if any present.  She does have a follow-up in 3 days in our office for potential expander fill.  She will keep this appointment.  The patient verbalized understanding and agreement to today's plan had no further questions or concerns.

## 2021-11-07 NOTE — Telephone Encounter (Signed)
Pt seen in office today.

## 2021-11-07 NOTE — Telephone Encounter (Signed)
Patient is calling in stating that she has been quite a lot of pain the last couple of days and wants to know if she can be worked in for an appointment today?

## 2021-11-08 ENCOUNTER — Ambulatory Visit: Payer: BC Managed Care – PPO

## 2021-11-10 ENCOUNTER — Ambulatory Visit (INDEPENDENT_AMBULATORY_CARE_PROVIDER_SITE_OTHER): Payer: BC Managed Care – PPO | Admitting: Surgical

## 2021-11-10 DIAGNOSIS — Z9012 Acquired absence of left breast and nipple: Secondary | ICD-10-CM

## 2021-11-10 DIAGNOSIS — C50412 Malignant neoplasm of upper-outer quadrant of left female breast: Secondary | ICD-10-CM

## 2021-11-10 DIAGNOSIS — Z170 Estrogen receptor positive status [ER+]: Secondary | ICD-10-CM

## 2021-11-10 DIAGNOSIS — Z9889 Other specified postprocedural states: Secondary | ICD-10-CM

## 2021-11-10 NOTE — Progress Notes (Addendum)
Patient is a 41 year old female here for follow-up on her bilateral breast reconstruction.  She most recently underwent removal and replacement of left breast tissue expander on 10/27/2021 with Dr. Marla Roe due to the implant being exposed.  Intraoperatively she had 100 cc of injectable saline placed in a [place] 455 cc expander.  She has a history of a left breast latissimus myocutaneous muscle flap.  She had the left breast JP drain removed on 11/07/2021, patient reports that she reported some mild improvement after the drain was removed, however is noticing some burning sensation near the JP drain insertion site.  She is not having any infectious symptoms.  Chaperone present on exam On exam right breast incision is intact, right breast tissue expanders in place with no subcutaneous fluid collection noted.  No erythema.  On exam of the left breast, left breast incision is CDI, left latissimus myocutaneous muscle flap skin paddle has good color and capillary refill.  There is no erythema or cellulitic changes.  She does have a subcutaneous fluid collection noted with palpation of the left breast.  There is no erythema or cellulitic changes noted.  AP:  30 cc of serosanguineous fluid was aspirated from the left breast using a sterile technique, patient tolerated this well.  I do not appreciate any active signs of infection, however I do feel as if she had a residual fluid collection that I was not able to drain due to risk of rupture to the expander  We placed injectable saline in the Expander using a sterile technique: Right: 0 cc for a total of 440 / 455 cc Left: 50 cc for a total of 200 / 455 cc  We will discuss patient's case with Dr. Marla Roe, we will plan to see patient back in 1 week.  Patient is aware to call with any significant changes, increased swelling, increased pain.  We also discussed her FMLA forms, will plan to review these.   Addendum: Patient requested referral to physical  therapy, I reviewed patient's chart and she had a referral placed for physical therapy on 11/04/2021 by Dr. Marla Roe.  Pictures were obtained of the patient and placed in the chart with the patient's or guardian's permission.

## 2021-11-17 ENCOUNTER — Ambulatory Visit (INDEPENDENT_AMBULATORY_CARE_PROVIDER_SITE_OTHER): Payer: BC Managed Care – PPO | Admitting: Surgical

## 2021-11-17 DIAGNOSIS — Z9889 Other specified postprocedural states: Secondary | ICD-10-CM

## 2021-11-17 DIAGNOSIS — Z170 Estrogen receptor positive status [ER+]: Secondary | ICD-10-CM

## 2021-11-17 DIAGNOSIS — C50412 Malignant neoplasm of upper-outer quadrant of left female breast: Secondary | ICD-10-CM

## 2021-11-17 NOTE — Progress Notes (Signed)
Patient is a 41 year old female here for follow-up on her bilateral breast reconstruction.  She most recently underwent removal and replacement of left breast tissue expander on 10/27/2021 with Dr. Marla Roe.  She does have a latissimus flap on the left side.  I saw the patient 1 week ago and 30 cc of serosanguineous fluid was aspirated from the left breast.  Patient reports she feels as if the left breast is "full" again.  She reports that she notices increased tenderness and discomfort with this sensation.  Chaperone present on exam On exam left breast incision is intact, CDI, left latissimus myocutaneous muscle flap skin paddle has good color and capillary refill.  There is no cellulitic changes.  She does have some subcutaneous fluid collection noted palpation.  On exam the right breast incision is intact, right NAC is viable.  There is no erythema or cellulitic changes.  No subcutaneous fluid collection noted.  Assessment/plan:  35 cc of serosanguineous fluid was aspirated from the left breast using a sterile technique, patient tolerated this well.  The fluid is not cloudy.  There is no purulence within the fluid collection.  We placed injectable saline in the expander using a sterile technique:  Right: 50 cc for a total of 490/455 cc. Left: 25 cc for a total of 225/455 cc  Recommend following up in 1 week for reevaluation, we will continue to monitor left breast for fluid collection.  We discussed her FMLA forms, reported that I reviewed these and they appear to be filled out correctly so I am unsure what issues are arising in regards to this.  I asked that she talks with her representative about this.  Recommend following up in 2 weeks for further evaluation with Dr. Marla Roe.

## 2021-11-21 ENCOUNTER — Encounter
Payer: BC Managed Care – PPO | Attending: Physical Medicine & Rehabilitation | Admitting: Physical Medicine & Rehabilitation

## 2021-11-21 ENCOUNTER — Encounter: Payer: Self-pay | Admitting: Physical Medicine & Rehabilitation

## 2021-11-21 VITALS — BP 125/86 | HR 65 | Ht 70.0 in | Wt 190.0 lb

## 2021-11-21 DIAGNOSIS — F319 Bipolar disorder, unspecified: Secondary | ICD-10-CM | POA: Diagnosis not present

## 2021-11-21 DIAGNOSIS — G8918 Other acute postprocedural pain: Secondary | ICD-10-CM

## 2021-11-21 MED ORDER — GABAPENTIN 100 MG PO CAPS
300.0000 mg | ORAL_CAPSULE | Freq: Three times a day (TID) | ORAL | 3 refills | Status: DC
Start: 2021-11-21 — End: 2022-01-10

## 2021-11-21 NOTE — Progress Notes (Unsigned)
Subjective:    Patient ID: Haley Evans, female    DOB: [birth year], 41 y.o.   MRN: [id number]  HPI HPI Haley Evans is a 41 year old female with past medical history of bipolar disorder, breast cancer here for chronic pain.  Patient had mastectomy 05/12/2021.  She then had left breast reconstruction with placement of tissue expander 06/15/2021.  On 07/18/2021 she had another surgery after having poor wound healing and had left breast reconstruction with latissimus flap and tissue expander by Dr. Marla Roe.  Patient reports that she has had worsening pain since her surgeries.  Pain has has most severe after her third surgery. She has had worsening pain over her left chest back and shoulder.  She has burning and tingling pain over her back at the site of the latissimus flap.  She has dull aching pain around her scapula.  She also has aching in shooting pain around her chest.  She has tried Voltaren gel and Flexeril without much benefit.  She is working with physical therapy and completing dry needling with improvement to her pain.  She has also tried gabapentin 100 mg 3 times daily but reports it makes her feel foggy.  It does provide some benefit to the pain. She also did not take the medication regularly.  She also tried Tylenol and Advil with mild to moderate improvement in her pain when she takes them. She has a history of bipolar disorder. She reports she is not on any medications at this time although she feels her bipolar disorder has been stable recently.    Interval History Haley Evans is here for follow-up regarding her chronic postmastectomy pain. She had had removal and replacement of L breast tissue expander 10/27/21 by Dr. Marla Roe.  She reports that her pain has significantly improved since this time.  She no longer has the burning and tingling pain over her chest back and shoulder.  She still has some aching pain in her left lateral back.  She is taking turmeric with benefit.  She  also sometimes takes Tylenol (less than 3g daily) or ibuprofen.  She was restarted gabapentin as patient reports she did not tolerate Lyrica very well.  She does not take the gabapentin regularly and will take 100 to 300 mg with each dose.  She is not having significant sedation with this medication.  She reports that her prior concerns of sedation with gabapentin were possibly related to other medications she was taking at the time.  Patient reports that plastic surgery has reordered her physical therapy and she plans to start this soon.  Pain Inventory Average Pain 6 Pain Right Now 7 My pain is  .  In the last 24 hours, has pain interfered with the following? General activity 6 Relation with others 6 Enjoyment of life 6 What TIME of day is your pain at its worst? evening and night Sleep (in general) Fair  Pain is worse with: sitting and inactivity Pain improves with: rest, heat/ice, therapy/exercise, and medication Relief from Meds: 6  Family History  Problem Relation Age of Onset   Bladder Cancer Maternal Grandmother 94   Pancreatic cancer Maternal Grandfather 63   Social History   Socioeconomic History   Marital status: Single    Spouse name: Not on file   Number of children: Not on file   Years of education: Not on file   Highest education level: Not on file  Occupational History   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  Tobacco  Use   Smoking status: Former    Years: 20.00    Types: Cigarettes    Quit date: 04/12/2021    Years since quitting: 0.6   Smokeless tobacco: Never  Vaping Use   Vaping Use: Never used  Substance and Sexual Activity   Alcohol use: Not Currently    Alcohol/week: 5.0 standard drinks of alcohol    Types: 5 Standard drinks or equivalent per week   Drug use: No   Sexual activity: Not on file    Comment: Copper 10 yr IUD  Other Topics Concern   Not on file  Social History Narrative   Not on file   Social Determinants of  Health   Financial Resource Strain: Not on file  Food Insecurity: Not on file  Transportation Needs: Not on file  Physical Activity: Not on file  Stress: Not on file  Social Connections: Not on file   Past Surgical History:  Procedure Laterality Date   ARM WOUND REPAIR / CLOSURE Right    fractured arm   BREAST RECONSTRUCTION WITH PLACEMENT OF TISSUE EXPANDER AND FLEX HD (ACELLULAR HYDRATED DERMIS) Left 06/15/2021   Procedure: REPLACEMENT OF TISSUE EXPANDER AND FLEX HD (ACELLULAR HYDRATED DERMIS);  Surgeon: Wallace Going, DO;  Location: [place];  Service: Plastics;  Laterality: Left;   CESAREAN SECTION     DEBRIDEMENT AND CLOSURE WOUND Left 06/15/2021   Procedure: DEBRIDEMENT AND CLOSURE LEFT BREAST WOUND;  Surgeon: Wallace Going, DO;  Location: [place];  Service: Plastics;  Laterality: Left;  1 hour   LATISSIMUS FLAP TO BREAST Left 07/18/2021   Procedure: LATISSIMUS FLAP TO BREAST;  Surgeon: Wallace Going, DO;  Location: [place];  Service: Plastics;  Laterality: Left;   NIPPLE SPARING MASTECTOMY Right 05/12/2021   Procedure: RIGHT NIPPLE SPARING MASTECTOMY;  Surgeon: Jovita Kussmaul, MD;  Location: [place];  Service: General;  Laterality: Right;   NIPPLE SPARING MASTECTOMY WITH SENTINEL LYMPH NODE BIOPSY Left 05/12/2021   Procedure: LEFT NIPPLE SPARING MASTECTOMY WITH LEFT SENTINEL LYMPH NODE BIOPSY;  Surgeon: Jovita Kussmaul, MD;  Location: [place];  Service: General;  Laterality: Left;   PRE-PECTORAL BREAST RECON W/ PLACEMENT OF TISSUE EXPANDERS, DERMAL GRAFT Bilateral 05/12/2021   Procedure: IMMEDIATE BILATERAL BREAST RECONSTRUCTION WITH PLACEMENT OF TISSUE EXPANDERS AND FLEX HD;  Surgeon: Wallace Going, DO;  Location: [place];  Service: Plastics;  Laterality: Bilateral;   REMOVAL OF TISSUE EXPANDER Left 10/27/2021   Procedure: Left breast seroma drainage with tissue expander exchange;  Surgeon: Wallace Going, DO;   Location: [place];  Service: Plastics;  Laterality: Left;   TISSUE EXPANDER PLACEMENT Left 07/18/2021   Procedure: REMOVAL AND REPLACEMENT OF TISSUE EXPANDER;  Surgeon: Wallace Going, DO;  Location: [place];  Service: Plastics;  Laterality: Left;  3 hours   Past Surgical History:  Procedure Laterality Date   ARM WOUND REPAIR / CLOSURE Right    fractured arm   BREAST RECONSTRUCTION WITH PLACEMENT OF TISSUE EXPANDER AND FLEX HD (ACELLULAR HYDRATED DERMIS) Left 06/15/2021   Procedure: REPLACEMENT OF TISSUE EXPANDER AND FLEX HD (ACELLULAR HYDRATED DERMIS);  Surgeon: Wallace Going, DO;  Location: [place];  Service: Plastics;  Laterality: Left;   CESAREAN SECTION     DEBRIDEMENT AND CLOSURE WOUND Left 06/15/2021   Procedure: DEBRIDEMENT AND CLOSURE LEFT BREAST WOUND;  Surgeon: Wallace Going, DO;  Location: [place];  Service: Plastics;  Laterality: Left;  1 hour  LATISSIMUS FLAP TO BREAST Left 07/18/2021   Procedure: LATISSIMUS FLAP TO BREAST;  Surgeon: Wallace Going, DO;  Location: [place];  Service: Plastics;  Laterality: Left;   NIPPLE SPARING MASTECTOMY Right 05/12/2021   Procedure: RIGHT NIPPLE SPARING MASTECTOMY;  Surgeon: Jovita Kussmaul, MD;  Location: [place];  Service: General;  Laterality: Right;   NIPPLE SPARING MASTECTOMY WITH SENTINEL LYMPH NODE BIOPSY Left 05/12/2021   Procedure: LEFT NIPPLE SPARING MASTECTOMY WITH LEFT SENTINEL LYMPH NODE BIOPSY;  Surgeon: Jovita Kussmaul, MD;  Location: [place];  Service: General;  Laterality: Left;   PRE-PECTORAL BREAST RECON W/ PLACEMENT OF TISSUE EXPANDERS, DERMAL GRAFT Bilateral 05/12/2021   Procedure: IMMEDIATE BILATERAL BREAST RECONSTRUCTION WITH PLACEMENT OF TISSUE EXPANDERS AND FLEX HD;  Surgeon: Wallace Going, DO;  Location: [place];  Service: Plastics;  Laterality: Bilateral;   REMOVAL OF TISSUE EXPANDER Left 10/27/2021   Procedure: Left breast seroma drainage with tissue  expander exchange;  Surgeon: Wallace Going, DO;  Location: [place];  Service: Plastics;  Laterality: Left;   TISSUE EXPANDER PLACEMENT Left 07/18/2021   Procedure: REMOVAL AND REPLACEMENT OF TISSUE EXPANDER;  Surgeon: Wallace Going, DO;  Location: [place];  Service: Plastics;  Laterality: Left;  3 hours   Past Medical History:  Diagnosis Date   Anemia    Anxiety    Bipolar 2 disorder ([place])    Cancer ([facility])    breast   Depression    Family history of adverse reaction to anesthesia    aunt woke up during hip replacement   Seizure ([place])    vs syncopy, age 48 and 12/2017, Seen by neurologist, a definitive diagnosis was not made. Pt reports having seizure aroudn 2021   BP 125/86   Pulse 65   Ht '5\' 10"'$  (1.778 m)   Wt 190 lb (86.2 kg)   LMP 10/22/2021 Comment: Copper IUD in place  SpO2 98%   BMI 27.26 kg/m   Opioid Risk Score:   Fall Risk Score:  `1  Depression screen [facility] 2/9     11/21/2021    2:02 PM 09/29/2021    1:46 PM  Depression screen PHQ 2/9  Decreased Interest 0 1  Down, Depressed, Hopeless 0 0  PHQ - 2 Score 0 1  Altered sleeping  1  Tired, decreased energy  2  Change in appetite  2  Feeling bad or failure about yourself   0  Trouble concentrating  1  Moving slowly or fidgety/restless  0  Suicidal thoughts  0  PHQ-9 Score  7  Difficult doing work/chores  Somewhat difficult      Review of Systems  Musculoskeletal:  Positive for back pain.  All other systems reviewed and are negative.     Objective:   Physical Exam  Gen: no distress, normal appearing HEENT: oral mucosa pink and moist, NCAT Cardio: Reg rate Chest: normal effort, normal rate of breathing Abd: soft, non-distended Ext: no edema Psych: pleasant, normal affect Skin: intact Neuro: Alert and oriented , follows commands, cranial nerves II through XII intact, sensation intact to light touch in all 4 extremities Strength 5 out of 5 in all 4 extremities Musculoskeletal: minimal tenderness  noted over left anterior chest, upper arm S/p latissimus flap surgery on the left, tenderness in over lateral left posterior back Active abduction of the left shoulder to much improved, minimal pain with shoulder external rotation and internal rotation,  No significant pain with  L shoulder horizontal flexion, adduction No tenderness noted around R shoulder or back      Assessment & Plan:  Post mastectomy and reconstruction pain, gradually improving.  Pain appears to be much improved after her recent surgery on 10/27/2021 where she had removal and replacement of the left breast tissue expander.  -DC lyrica '50mg'$  TID -Increase gabapentin to '300mg'$  TID, patient reports that gabapentin has been helping her pain however we discussed that she may have better results if she tries to take this medication regularly instead of intermittently -Continue Tylenol as needed, do not use more than 3 g a day -Continue physical therapy   Hx of bipolar disorder not on medications -She reports her mood has been stable.  -Will need to take this into account if considering starting antidepressant class medications. May consider venlafaxine if additional medication option is needed

## 2021-11-22 ENCOUNTER — Ambulatory Visit (INDEPENDENT_AMBULATORY_CARE_PROVIDER_SITE_OTHER): Payer: BC Managed Care – PPO | Admitting: Plastic Surgery

## 2021-11-22 ENCOUNTER — Encounter: Payer: Self-pay | Admitting: Plastic Surgery

## 2021-11-22 ENCOUNTER — Encounter: Payer: Self-pay | Admitting: Physical Medicine & Rehabilitation

## 2021-11-22 DIAGNOSIS — C50412 Malignant neoplasm of upper-outer quadrant of left female breast: Secondary | ICD-10-CM

## 2021-11-22 DIAGNOSIS — Z170 Estrogen receptor positive status [ER+]: Secondary | ICD-10-CM

## 2021-11-22 NOTE — Progress Notes (Signed)
   Subjective:    Patient ID: Haley Evans, female    DOB: Apr 12, 1980, 41 y.o.   MRN: [id number]  The patient is a 41 year old female here for follow-up on her breast reconstruction.  She had bilateral mastectomies.  She had a right breast expander placed and now has a left breast expander under a latissimus muscle flap.  Overall she is doing well.  Today she does not report any drainage or sign of infection.      Review of Systems  Constitutional: Negative.   Eyes: Negative.   Respiratory: Negative.    Cardiovascular: Negative.   Gastrointestinal: Negative.   Genitourinary: Negative.   Musculoskeletal: Negative.        Objective:   Physical Exam Constitutional:      Appearance: Normal appearance.  Cardiovascular:     Rate and Rhythm: Normal rate.     Pulses: Normal pulses.  Skin:    Capillary Refill: Capillary refill takes less than 2 seconds.  Neurological:     Mental Status: She is alert and oriented to person, place, and time.  Psychiatric:        Mood and Affect: Mood normal.        Behavior: Behavior normal.        Thought Content: Thought content normal.           Assessment & Plan:   No diagnosis found.   We placed injectable saline in the Expander using a sterile technique: Right: 50 cc for a total of 510 / 455 cc Left: 50 cc for a total of 275 / 455 cc

## 2021-11-23 ENCOUNTER — Encounter: Payer: Self-pay | Admitting: Rehabilitation

## 2021-11-23 ENCOUNTER — Ambulatory Visit: Payer: BC Managed Care – PPO | Attending: Student | Admitting: Rehabilitation

## 2021-11-23 DIAGNOSIS — R293 Abnormal posture: Secondary | ICD-10-CM | POA: Insufficient documentation

## 2021-11-23 DIAGNOSIS — R252 Cramp and spasm: Secondary | ICD-10-CM | POA: Insufficient documentation

## 2021-11-23 DIAGNOSIS — C50412 Malignant neoplasm of upper-outer quadrant of left female breast: Secondary | ICD-10-CM | POA: Insufficient documentation

## 2021-11-23 DIAGNOSIS — Z9889 Other specified postprocedural states: Secondary | ICD-10-CM | POA: Insufficient documentation

## 2021-11-23 DIAGNOSIS — M25612 Stiffness of left shoulder, not elsewhere classified: Secondary | ICD-10-CM | POA: Insufficient documentation

## 2021-11-23 DIAGNOSIS — Z483 Aftercare following surgery for neoplasm: Secondary | ICD-10-CM | POA: Insufficient documentation

## 2021-11-23 DIAGNOSIS — Z170 Estrogen receptor positive status [ER+]: Secondary | ICD-10-CM | POA: Insufficient documentation

## 2021-11-23 NOTE — Therapy (Addendum)
OUTPATIENT PHYSICAL THERAPY CERVICAL EVALUATION   Patient Name: Haley Evans MRN: [id number] DOB:[birth year], 41 y.o., female Today's Date: 11/23/2021   PT End of Session - 11/23/21 1507     Visit Number 9    Number of Visits 10    Date for PT Re-Evaluation 10/18/21    Authorization Type BCBS    PT Start Time 1500    PT Stop Time 1555    PT Time Calculation (min) 55 min    Activity Tolerance Patient tolerated treatment well    Behavior During Therapy WFL for tasks assessed/performed              Past Medical History:  Diagnosis Date   Anemia    Anxiety    Bipolar 2 disorder ([place])    Cancer ([place])    breast   Depression    Family history of adverse reaction to anesthesia    aunt woke up during hip replacement   Seizure ([place])    vs syncopy, age 35 and 12/2017, Seen by neurologist, a definitive diagnosis was not made. Pt reports having seizure aroudn 2021   Past Surgical History:  Procedure Laterality Date   ARM WOUND REPAIR / CLOSURE Right    fractured arm   BREAST RECONSTRUCTION WITH PLACEMENT OF TISSUE EXPANDER AND FLEX HD (ACELLULAR HYDRATED DERMIS) Left 06/15/2021   Procedure: REPLACEMENT OF TISSUE EXPANDER AND FLEX HD (ACELLULAR HYDRATED DERMIS);  Surgeon: Wallace Going, DO;  Location: [place];  Service: Plastics;  Laterality: Left;   CESAREAN SECTION     DEBRIDEMENT AND CLOSURE WOUND Left 06/15/2021   Procedure: DEBRIDEMENT AND CLOSURE LEFT BREAST WOUND;  Surgeon: Wallace Going, DO;  Location: [place];  Service: Plastics;  Laterality: Left;  1 hour   LATISSIMUS FLAP TO BREAST Left 07/18/2021   Procedure: LATISSIMUS FLAP TO BREAST;  Surgeon: Wallace Going, DO;  Location: [place];  Service: Plastics;  Laterality: Left;   NIPPLE SPARING MASTECTOMY Right 05/12/2021   Procedure: RIGHT NIPPLE SPARING MASTECTOMY;  Surgeon: Jovita Kussmaul, MD;  Location: [place];  Service: General;  Laterality: Right;   NIPPLE SPARING MASTECTOMY WITH  SENTINEL LYMPH NODE BIOPSY Left 05/12/2021   Procedure: LEFT NIPPLE SPARING MASTECTOMY WITH LEFT SENTINEL LYMPH NODE BIOPSY;  Surgeon: Jovita Kussmaul, MD;  Location: [place];  Service: General;  Laterality: Left;   PRE-PECTORAL BREAST RECON W/ PLACEMENT OF TISSUE EXPANDERS, DERMAL GRAFT Bilateral 05/12/2021   Procedure: IMMEDIATE BILATERAL BREAST RECONSTRUCTION WITH PLACEMENT OF TISSUE EXPANDERS AND FLEX HD;  Surgeon: Wallace Going, DO;  Location: [place];  Service: Plastics;  Laterality: Bilateral;   REMOVAL OF TISSUE EXPANDER Left 10/27/2021   Procedure: Left breast seroma drainage with tissue expander exchange;  Surgeon: Wallace Going, DO;  Location: [place];  Service: Plastics;  Laterality: Left;   TISSUE EXPANDER PLACEMENT Left 07/18/2021   Procedure: REMOVAL AND REPLACEMENT OF TISSUE EXPANDER;  Surgeon: Wallace Going, DO;  Location: [place];  Service: Plastics;  Laterality: Left;  3 hours   Patient Active Problem List   Diagnosis Date Noted   Admission for removal of tissue expander of breast, unspecified laterality 07/18/2021   Acquired absence of left breast 07/18/2021   Open wound of left breast 07/15/2021   Breast cancer ([place]) 05/12/2021   Genetic testing 03/18/2021   Family history of pancreatic cancer 03/10/2021   Malignant neoplasm of upper-outer quadrant of left breast in female, estrogen receptor positive ([place])  03/07/2021   Anxiety 06/25/2011    PCP: Eldridge Abrahams, NP  REFERRING PROVIDER: Nicholas Lose, MD  REFERRING DIAG: [phone] (ICD-10-CM) - Malignant neoplasm of upper-outer quadrant of left breast in female, estrogen receptor positive ([place]  THERAPY DIAG:  Aftercare following surgery for neoplasm  Cramp and spasm  Abnormal posture  Stiffness of left shoulder, not elsewhere classified  Malignant neoplasm of upper-outer quadrant of left breast in female, estrogen receptor positive ([place])  Rationale for  Evaluation and Treatment Rehabilitation  ONSET DATE: shoulder pain began after surgery last month  SUBJECTIVE:                                                                                                                                                                                                         SUBJECTIVE STATEMENT: She was referred here because she has a lot of scar tissue. She states the doctor said she is good for light stretching and massage. She received a fill yesterday. She doesn't have the pain in her back just uncomfortable feeling. She is having pain in the back of the neck.   PERTINENT HISTORY:  Patient was diagnosed on 01/12/2021 with left grade II invasive ductal carcinoma breast cancer. She underwent a bilateral nipple sparing mastectomy and left sentinel node biopsy (3 negative nodes) on 05/12/2021 with expanders placed for reconstruction. It is ER/PR positive and HER2 negative with a Ki67 of 1%. Muscle flap reconstruction performed 5/36/14 due to complications. 8/31/211 Left tissue expander exchange with seroma drainage.   PAIN:  Are you having pain? No - just uncomfortable  NPRS- worst 6-8/ 10   PRECAUTIONS: Other: s/p mastectomy with complications  WEIGHT BEARING RESTRICTIONS none   FALLS:  Has patient fallen in last 6 months? No  LIVING ENVIRONMENT: Lives with: lives with their family Lives in: House/apartment   OCCUPATION: Pt is on FMLA  PLOF: Independent  PATIENT GOALS reduce Lt UE pain, return to prior level of function  OBJECTIVE:   DIAGNOSTIC FINDINGS:  NA  PATIENT SURVEYS:  Quick Dash 36.36   COGNITION: Overall cognitive status: Within functional limits for tasks assessed   SENSATION: WFL  POSTURE: rounded shoulders  OBSERVATION: incision closed and healed   PALPATION: Trigger points in upper trap , edema vs lat flap tissue, left lateral trunk, pec tightness, edema under incision site  CERVICAL ROM:  Pain with Cervical  rotation to left    UPPER EXTREMITY ROM:  Active ROM Right eval Left eval 11/23/21  Shoulder flexion  90 150  Shoulder extension     Shoulder abduction  70 173  Shoulder adduction     Shoulder extension     Shoulder internal rotation  To Lt gluteals   Shoulder external rotation   90  Elbow flexion     Elbow extension     Wrist flexion     Wrist extension     Wrist ulnar deviation     Wrist radial deviation     Wrist pronation     Wrist supination      (Blank rows = not tested)  UPPER EXTREMITY MMT: Initial:Tested in neutral: flexion 4-/5, ext 4/5, IR 4/5, ER 4-/5    TODAY'S TREATMENT:   11/23/2021 Passive ROM and stretching- shoulder flexion  MLD In supine: Short neck, Rt axilla and Lt inguinal nodes, superficial and deep abdominals, anterior inter-axillary and Lt axillo-inguinal anastomosis, and Lt lateral trunk focusing on areas of swelling redirecting towards anastomosis.   08/23/2021 Trigger Point Dry-Needling  Treatment instructions: Expect mild to moderate muscle soreness. S/S of pneumothorax if dry needled over a lung field, and to seek immediate medical attention should they occur. Patient verbalized understanding of these instructions and education.  Patient Consent Given: Yes Education handout provided: Yes Muscles treated: Lt rhomboids, subscapularis, upper traps  Treatment response/outcome: Utilized skilled palpation to identify trigger points.  During dry needling able to palpate muscle twitch and muscle elongation   Skilled palpation and monitoring by PT during dry needling  Elongation and release to Lt scapula after DN   PATIENT EDUCATION:  DN info   HOME EXERCISE PROGRAM: NA- pt has HEP issued in prior PT and PT is checking with MD to determine when she will be allowed to resume.   ASSESSMENT:  CLINICAL IMPRESSION: Patient responded well to therapy today. Pt has some edema near incision sites. Patient has muscle tightness in lats and pec  muscles. Patient was educated on getting a compression garment and where to get it from. Patient will benefit from skilled PT to address the below impairments and improve overall function.    OBJECTIVE IMPAIRMENTS decreased ROM, decreased strength, increased edema, increased fascial restrictions, increased muscle spasms, postural dysfunction, and pain.   ACTIVITY LIMITATIONS bathing, dressing, reach over head, and hygiene/grooming  PARTICIPATION LIMITATIONS: meal prep, cleaning, driving, and community activity  PERSONAL FACTORS 1 comorbidity: breast surgery with complications   are also affecting patient's functional outcome.   REHAB POTENTIAL: Good  CLINICAL DECISION MAKING: Stable/uncomplicated  EVALUATION COMPLEXITY: Low   GOALS: Goals reviewed with patient? Yes  SHORT TERM GOALS: Target date: 09/20/2021   Be independent in initial HEP as allowed by MD Baseline:  Goal status: IN PROGRESS  2.  Report > or = to 30% reduction in Lt shoulder/scapular pain with ADLs and self-care Baseline:  Goal status: IN PROGRESS  3.  Demonstrate> or = to 110 degrees of Lt shoulder flexion A/ROM to improve overhead reaching  Baseline: 90 Goal status: MET  4.  Improve DASH to < or = to 40  Baseline: 56 Goal status: MET  LONG TERM GOALS: Target date: 12/28/2021  Be independent in advanced HEP Baseline:  Goal status: INITIAL  2.  Improve Lt shoulder A/ROM flexion to > or = to 135 degrees to improve overhead reaching  Baseline: 90 Goal status: MET  3.  Improve DASH to < or = to 25 Baseline: 56 Goal status: IN PROGRESS  4.  Report < or = to 3/10 Lt shoulder/scapular pain with use  Baseline:  Goal status: MET  5.  Demonstrate Lt shoulder A/ROM  IR to > or = to T10 to improve self-care Baseline: gluteals  Goal status: INITIAL     PLAN: PT FREQUENCY: 1-2x/week  PT DURATION: 6 weeks  PLANNED INTERVENTIONS: Therapeutic exercises, Therapeutic activity, Neuromuscular  re-education, Patient/Family education, Joint mobilization, Dry Needling, Cryotherapy, Moist heat, scar mobilization, Taping, and Manual therapy  PLAN FOR NEXT SESSION: continue DN, ROM and exercises as allowed by MD, scar mobs   Rosario Jacks, SPT  [facility] [address], [place], [place] [address] Phone # [phone] Fax [phone]

## 2021-11-24 ENCOUNTER — Ambulatory Visit: Payer: BC Managed Care – PPO | Admitting: Surgical

## 2021-11-24 ENCOUNTER — Encounter: Payer: Self-pay | Admitting: Physical Medicine & Rehabilitation

## 2021-11-24 ENCOUNTER — Encounter: Payer: Self-pay | Admitting: Plastic Surgery

## 2021-11-25 MED ORDER — CYCLOBENZAPRINE HCL 10 MG PO TABS: 10.0000 mg | ORAL_TABLET | Freq: Two times a day (BID) | ORAL | 0 refills | Status: DC | PRN

## 2021-11-30 ENCOUNTER — Ambulatory Visit: Payer: BC Managed Care – PPO | Attending: Hematology and Oncology

## 2021-11-30 DIAGNOSIS — R293 Abnormal posture: Secondary | ICD-10-CM | POA: Diagnosis present

## 2021-11-30 DIAGNOSIS — Z483 Aftercare following surgery for neoplasm: Secondary | ICD-10-CM | POA: Diagnosis present

## 2021-11-30 DIAGNOSIS — M25612 Stiffness of left shoulder, not elsewhere classified: Secondary | ICD-10-CM | POA: Insufficient documentation

## 2021-11-30 DIAGNOSIS — R252 Cramp and spasm: Secondary | ICD-10-CM | POA: Diagnosis present

## 2021-11-30 NOTE — Therapy (Signed)
OUTPATIENT PHYSICAL THERAPY CERVICAL EVALUATION   Patient Name: Haley Evans MRN: [id number] DOB:September 19, 1980, 41 y.o., female Today's Date: 11/30/2021   PT End of Session - 11/30/21 1704     Visit Number 10    Date for PT Re-Evaluation 12/28/21    Authorization Type BCBS    PT Start Time 1618    PT Stop Time 1701    PT Time Calculation (min) 43 min    Activity Tolerance Patient tolerated treatment well    Behavior During Therapy WFL for tasks assessed/performed               Past Medical History:  Diagnosis Date   Anemia    Anxiety    Bipolar 2 disorder ([place])    Cancer ([place])    breast   Depression    Family history of adverse reaction to anesthesia    aunt woke up during hip replacement   Seizure ([place])    vs syncopy, age 43 and 12/2017, Seen by neurologist, a definitive diagnosis was not made. Pt reports having seizure aroudn 2021   Past Surgical History:  Procedure Laterality Date   ARM WOUND REPAIR / CLOSURE Right    fractured arm   BREAST RECONSTRUCTION WITH PLACEMENT OF TISSUE EXPANDER AND FLEX HD (ACELLULAR HYDRATED DERMIS) Left 06/15/2021   Procedure: REPLACEMENT OF TISSUE EXPANDER AND FLEX HD (ACELLULAR HYDRATED DERMIS);  Surgeon: Wallace Going, DO;  Location: [facility];  Service: Plastics;  Laterality: Left;   CESAREAN SECTION     DEBRIDEMENT AND CLOSURE WOUND Left 06/15/2021   Procedure: DEBRIDEMENT AND CLOSURE LEFT BREAST WOUND;  Surgeon: Wallace Going, DO;  Location: [facility];  Service: Plastics;  Laterality: Left;  1 hour   LATISSIMUS FLAP TO BREAST Left 07/18/2021   Procedure: LATISSIMUS FLAP TO BREAST;  Surgeon: Wallace Going, DO;  Location: [facility];  Service: Plastics;  Laterality: Left;   NIPPLE SPARING MASTECTOMY Right 05/12/2021   Procedure: RIGHT NIPPLE SPARING MASTECTOMY;  Surgeon: Jovita Kussmaul, MD;  Location: [facility];  Service: General;  Laterality: Right;   NIPPLE SPARING MASTECTOMY WITH SENTINEL LYMPH NODE  BIOPSY Left 05/12/2021   Procedure: LEFT NIPPLE SPARING MASTECTOMY WITH LEFT SENTINEL LYMPH NODE BIOPSY;  Surgeon: Jovita Kussmaul, MD;  Location: [facility];  Service: General;  Laterality: Left;   PRE-PECTORAL BREAST RECON W/ PLACEMENT OF TISSUE EXPANDERS, DERMAL GRAFT Bilateral 05/12/2021   Procedure: IMMEDIATE BILATERAL BREAST RECONSTRUCTION WITH PLACEMENT OF TISSUE EXPANDERS AND FLEX HD;  Surgeon: Wallace Going, DO;  Location: [facility];  Service: Plastics;  Laterality: Bilateral;   REMOVAL OF TISSUE EXPANDER Left 10/27/2021   Procedure: Left breast seroma drainage with tissue expander exchange;  Surgeon: Wallace Going, DO;  Location: [facility];  Service: Plastics;  Laterality: Left;   TISSUE EXPANDER PLACEMENT Left 07/18/2021   Procedure: REMOVAL AND REPLACEMENT OF TISSUE EXPANDER;  Surgeon: Wallace Going, DO;  Location: [facility];  Service: Plastics;  Laterality: Left;  3 hours   Patient Active Problem List   Diagnosis Date Noted   Admission for removal of tissue expander of breast, unspecified laterality 07/18/2021   Acquired absence of left breast 07/18/2021   Open wound of left breast 07/15/2021   Breast cancer ([place]) 05/12/2021   Genetic testing 03/18/2021   Family history of pancreatic cancer 03/10/2021   Malignant neoplasm of upper-outer quadrant of left breast in female, estrogen receptor positive ([place]) 03/07/2021   Anxiety 06/25/2011  PCP: [place]ridge Abrahams, NP  REFERRING PROVIDER: Nicholas Lose, MD  REFERRING DIAG: [phone] (ICD-10-CM) - Malignant neoplasm of upper-outer quadrant of left breast in female, estrogen receptor positive ([place]  THERAPY DIAG:  Aftercare following surgery for neoplasm  Cramp and spasm  Abnormal posture  Rationale for Evaluation and Treatment Rehabilitation  ONSET DATE: shoulder pain began after surgery last month  SUBJECTIVE:                                                                                                                                                                                                          SUBJECTIVE STATEMENT: I don't think that I have any restrictions.  My shoulder is good.  I am having a lot of tension and pulling in the Lt side of my neck.     PERTINENT HISTORY:  Patient was diagnosed on 01/12/2021 with left grade II invasive ductal carcinoma breast cancer. She underwent a bilateral nipple sparing mastectomy and left sentinel node biopsy (3 negative nodes) on 05/12/2021 with expanders placed for reconstruction. It is ER/PR positive and HER2 negative with a Ki67 of 1%. Muscle flap reconstruction performed 6/97/94 due to complications. 8/31/211 Left tissue expander exchange with seroma drainage.   PAIN:  Are you having pain? No -a lot of stiffness and pulling.   NPRS- worst 6-8/ 10   PRECAUTIONS: Other: s/p mastectomy with complications  WEIGHT BEARING RESTRICTIONS none   FALLS:  Has patient fallen in last 6 months? No  LIVING ENVIRONMENT: Lives with: lives with their family Lives in: House/apartment  OCCUPATION: Pt is on FMLA  PLOF: Independent  PATIENT GOALS reduce Lt UE pain, return to prior level of function  OBJECTIVE:   DIAGNOSTIC FINDINGS:  NA  PATIENT SURVEYS:  Quick Dash 36.36   COGNITION: Overall cognitive status: Within functional limits for tasks assessed   SENSATION: WFL  POSTURE: rounded shoulders  OBSERVATION: incision closed and healed   PALPATION: Trigger points in upper trap , edema vs lat flap tissue, left lateral trunk, pec tightness, edema under incision site  CERVICAL ROM:  Pain with Cervical rotation to left    UPPER EXTREMITY ROM:  Active ROM Right eval Left eval 11/23/21  Shoulder flexion  90 150  Shoulder extension     Shoulder abduction  70 173  Shoulder adduction     Shoulder extension     Shoulder internal rotation  To Lt gluteals   Shoulder external rotation   90  Elbow flexion      Elbow extension     Wrist flexion  Wrist extension     Wrist ulnar deviation     Wrist radial deviation     Wrist pronation     Wrist supination      (Blank rows = not tested)  UPPER EXTREMITY MMT: Initial:Tested in neutral: flexion 4-/5, ext 4/5, IR 4/5, ER 4-/5  TODAY'S TREATMENT:   11/30/2021 Trigger Point Dry-Needling  Treatment instructions: Expect mild to moderate muscle soreness. S/S of pneumothorax if dry needled over a lung field, and to seek immediate medical attention should they occur. Patient verbalized understanding of these instructions and education.  Patient Consent Given: Yes Education handout provided: Yes Muscles treated: bil neck multifidi, upper traps, rhomboids and lats Treatment response/outcome: Utilized skilled palpation to identify trigger points.  During dry needling able to palpate muscle twitch and muscle elongation   Skilled palpation and monitoring by PT during dry needling  Manual: Elongation and release to bil neck and Lt scapular, scapular mobs  Cervical A/ROM added to HEP   11/23/2021 Passive ROM and stretching- shoulder flexion  MLD In supine: Short neck, Rt axilla and Lt inguinal nodes, superficial and deep abdominals, anterior inter-axillary and Lt axillo-inguinal anastomosis, and Lt lateral trunk focusing on areas of swelling redirecting towards anastomosis.   08/23/2021 Trigger Point Dry-Needling  Treatment instructions: Expect mild to moderate muscle soreness. S/S of pneumothorax if dry needled over a lung field, and to seek immediate medical attention should they occur. Patient verbalized understanding of these instructions and education.  Patient Consent Given: Yes Education handout provided: Yes Muscles treated: Lt rhomboids, subscapularis, upper traps  Treatment response/outcome: Utilized skilled palpation to identify trigger points.  During dry needling able to palpate muscle twitch and muscle elongation   Skilled palpation  and monitoring by PT during dry needling  Elongation and release to Lt scapula after DN   PATIENT EDUCATION:  DN info   HOME EXERCISE PROGRAM: NA- pt has HEP issued in prior PT and PT is checking with MD to determine when she will be allowed to resume.  Access Code: [email or web address] URL: [email or web address] Date: 11/30/2021 Prepared by: Claiborne Billings  Exercises - Seated Cervical Flexion AROM  - 3 x daily - 7 x weekly - 1 sets - 3 reps - 20 hold - Seated Cervical Sidebending AROM  - 3 x daily - 7 x weekly - 1 sets - 3 reps - 20 hold - Seated Cervical Rotation AROM  - 3 x daily - 7 x weekly - 1 sets - 3 reps - 20 hold - Seated Correct Posture  - 1 x daily - 7 x weekly - 3 sets - 10 reps ASSESSMENT:  CLINICAL IMPRESSION: Pt arrived with good Lt shoulder A/ROM.  Pt with Lt sided neck and scapular tension and stiffness.  Session focused on DN and needling to neck and Lt thoracic spine.  Pt had good response to DN with twitch response and improved tissue mobility after manual therapy. Pt with extensive trigger points in Lt neck and thoracic spine.   PT issued cervical flexibility exercises. Patient will benefit from skilled PT to address the below impairments and improve overall function.    OBJECTIVE IMPAIRMENTS decreased ROM, decreased strength, increased edema, increased fascial restrictions, increased muscle spasms, postural dysfunction, and pain.   ACTIVITY LIMITATIONS bathing, dressing, reach over head, and hygiene/grooming  PARTICIPATION LIMITATIONS: meal prep, cleaning, driving, and community activity  PERSONAL FACTORS 1 comorbidity: breast surgery with complications   are also affecting patient's functional outcome.   REHAB POTENTIAL: Good  CLINICAL DECISION  MAKING: Stable/uncomplicated  EVALUATION COMPLEXITY: Low   GOALS: Goals reviewed with patient? Yes  SHORT TERM GOALS: Target date: 09/20/2021   Be independent in initial HEP as allowed by MD Baseline:  Goal  status: IN PROGRESS  2.  Report > or = to 30% reduction in Lt shoulder/scapular pain with ADLs and self-care Baseline:  Goal status: IN PROGRESS  3.  Demonstrate> or = to 110 degrees of Lt shoulder flexion A/ROM to improve overhead reaching  Baseline: 90 Goal status: MET  4.  Improve DASH to < or = to 40  Baseline: 56 Goal status: MET  LONG TERM GOALS: Target date: 12/28/2021  Be independent in advanced HEP Baseline:  Goal status: INITIAL  2.  Improve Lt shoulder A/ROM flexion to > or = to 135 degrees to improve overhead reaching  Baseline: 90 Goal status: MET  3.  Improve DASH to < or = to 25 Baseline: 56 Goal status: IN PROGRESS  4.  Report < or = to 3/10 Lt shoulder/scapular pain with use  Baseline:  Goal status: MET  5.  Demonstrate Lt shoulder A/ROM IR to > or = to T10 to improve self-care Baseline: gluteals  Goal status: INITIAL     PLAN: PT FREQUENCY: 1-2x/week  PT DURATION: 6 weeks  PLANNED INTERVENTIONS: Therapeutic exercises, Therapeutic activity, Neuromuscular re-education, Patient/Family education, Joint mobilization, Dry Needling, Cryotherapy, Moist heat, scar mobilization, Taping, and Manual therapy  PLAN FOR NEXT SESSION: continue DN, ROM and exercises as allowed by MD, scar mobs   Sigurd Sos, PT 11/30/21 5:05 PM   [facility] [address] [place], [place] [address] Phone # [phone] Fax [phone]

## 2021-12-06 ENCOUNTER — Encounter: Payer: Self-pay | Admitting: Surgical

## 2021-12-06 ENCOUNTER — Ambulatory Visit (INDEPENDENT_AMBULATORY_CARE_PROVIDER_SITE_OTHER): Payer: BC Managed Care – PPO | Admitting: Surgical

## 2021-12-06 DIAGNOSIS — C50412 Malignant neoplasm of upper-outer quadrant of left female breast: Secondary | ICD-10-CM

## 2021-12-06 DIAGNOSIS — Z170 Estrogen receptor positive status [ER+]: Secondary | ICD-10-CM

## 2021-12-06 DIAGNOSIS — Z483 Aftercare following surgery for neoplasm: Secondary | ICD-10-CM | POA: Diagnosis not present

## 2021-12-06 MED ORDER — SULFAMETHOXAZOLE-TRIMETHOPRIM 800-160 MG PO TABS: 1.0000 | ORAL_TABLET | Freq: Two times a day (BID) | ORAL | 0 refills | Status: AC

## 2021-12-06 NOTE — Progress Notes (Addendum)
Patient is a 41 year old female here for follow-up on her bilateral breast reconstruction.  She most recently underwent removal and replacement of left breast tissue expander on 10/27/2021 with Dr. Marla Roe.  She has a latissimus flap on the left side.  She was seen 2 weeks ago and had an expansion fill bilaterally.  She reports that overall she is doing well.  She reports she feels "swollen".  She is not having any infectious symptoms.  She has some questions about her current breast size.  Chaperone present on exam On exam bilateral breast incisions are intact, healing well.  Left breast latissimus myocutaneous muscle flap skin paddle is viable.  There is no cellulitic changes of either breast.  She does have some mild erythema of the superior medial left breast.  There is some subcutaneous fluid collection to the left breast.   We placed injectable saline in the Expander using a sterile technique: Right: 30 cc for a total of 570 / 455 cc Left: 30 cc for a total of 305 / 455 cc  20 cc of cloudy serous fluid was aspirated from the left breast, patient tolerated this well.  Given the erythema of the left breast and the cloudy appearance of the fluid, will send for culture to guide any antibiotic treatment if necessary.  Will prophylactically treat with 5 days of Bactrim.  Recommend calling with questions or concerns, pictures were taken and placed in the patient's chart with patient's permission.  Patient orts she is scheduled for follow-up in 2 weeks.  We discussed that she may not be able to have much more expansion as the skin is becoming taut.

## 2021-12-06 NOTE — Addendum Note (Signed)
Addended by: Threasa Beards K on: 12/06/2021 03:20 PM   Modules accepted: Orders

## 2021-12-07 ENCOUNTER — Ambulatory Visit: Payer: BC Managed Care – PPO

## 2021-12-07 ENCOUNTER — Ambulatory Visit: Payer: BC Managed Care – PPO | Admitting: Rehabilitation

## 2021-12-07 DIAGNOSIS — R252 Cramp and spasm: Secondary | ICD-10-CM

## 2021-12-07 DIAGNOSIS — R293 Abnormal posture: Secondary | ICD-10-CM

## 2021-12-07 DIAGNOSIS — M25612 Stiffness of left shoulder, not elsewhere classified: Secondary | ICD-10-CM

## 2021-12-07 DIAGNOSIS — Z483 Aftercare following surgery for neoplasm: Secondary | ICD-10-CM | POA: Diagnosis not present

## 2021-12-07 NOTE — Therapy (Signed)
OUTPATIENT PHYSICAL THERAPY CERVICAL EVALUATION   Patient Name: Haley Evans MRN: [id number] DOB:May 21, 1980, 41 y.o., female Today's Date: 12/07/2021   PT End of Session - 12/07/21 1524     Visit Number 11    Date for PT Re-Evaluation 12/28/21    Authorization Type BCBS    PT Start Time 1448    PT Stop Time 1526    PT Time Calculation (min) 38 min    Activity Tolerance Patient tolerated treatment well    Behavior During Therapy WFL for tasks assessed/performed                Past Medical History:  Diagnosis Date   Anemia    Anxiety    Bipolar 2 disorder ([place])    Cancer ([place])    breast   Depression    Family history of adverse reaction to anesthesia    aunt woke up during hip replacement   Seizure ([place])    vs syncopy, age 22 and 12/2017, Seen by neurologist, a definitive diagnosis was not made. Pt reports having seizure aroudn 2021   Past Surgical History:  Procedure Laterality Date   ARM WOUND REPAIR / CLOSURE Right    fractured arm   BREAST RECONSTRUCTION WITH PLACEMENT OF TISSUE EXPANDER AND FLEX HD (ACELLULAR HYDRATED DERMIS) Left 06/15/2021   Procedure: REPLACEMENT OF TISSUE EXPANDER AND FLEX HD (ACELLULAR HYDRATED DERMIS);  Surgeon: Wallace Going, DO;  Location: [facility];  Service: Plastics;  Laterality: Left;   CESAREAN SECTION     DEBRIDEMENT AND CLOSURE WOUND Left 06/15/2021   Procedure: DEBRIDEMENT AND CLOSURE LEFT BREAST WOUND;  Surgeon: Wallace Going, DO;  Location: [place];  Service: Plastics;  Laterality: Left;  1 hour   LATISSIMUS FLAP TO BREAST Left 07/18/2021   Procedure: LATISSIMUS FLAP TO BREAST;  Surgeon: Wallace Going, DO;  Location: [facility];  Service: Plastics;  Laterality: Left;   NIPPLE SPARING MASTECTOMY Right 05/12/2021   Procedure: RIGHT NIPPLE SPARING MASTECTOMY;  Surgeon: Jovita Kussmaul, MD;  Location: [facility];  Service: General;  Laterality: Right;   NIPPLE SPARING MASTECTOMY WITH SENTINEL LYMPH NODE  BIOPSY Left 05/12/2021   Procedure: LEFT NIPPLE SPARING MASTECTOMY WITH LEFT SENTINEL LYMPH NODE BIOPSY;  Surgeon: Jovita Kussmaul, MD;  Location: [facility];  Service: General;  Laterality: Left;   PRE-PECTORAL BREAST RECON W/ PLACEMENT OF TISSUE EXPANDERS, DERMAL GRAFT Bilateral 05/12/2021   Procedure: IMMEDIATE BILATERAL BREAST RECONSTRUCTION WITH PLACEMENT OF TISSUE EXPANDERS AND FLEX HD;  Surgeon: Wallace Going, DO;  Location: [facility];  Service: Plastics;  Laterality: Bilateral;   REMOVAL OF TISSUE EXPANDER Left 10/27/2021   Procedure: Left breast seroma drainage with tissue expander exchange;  Surgeon: Wallace Going, DO;  Location: [facility];  Service: Plastics;  Laterality: Left;   TISSUE EXPANDER PLACEMENT Left 07/18/2021   Procedure: REMOVAL AND REPLACEMENT OF TISSUE EXPANDER;  Surgeon: Wallace Going, DO;  Location: [facility];  Service: Plastics;  Laterality: Left;  3 hours   Patient Active Problem List   Diagnosis Date Noted   Admission for removal of tissue expander of breast, unspecified laterality 07/18/2021   Acquired absence of left breast 07/18/2021   Open wound of left breast 07/15/2021   Breast cancer ([place]) 05/12/2021   Genetic testing 03/18/2021   Family history of pancreatic cancer 03/10/2021   Malignant neoplasm of upper-outer quadrant of left breast in female, estrogen receptor positive ([place]) 03/07/2021   Anxiety 06/25/2011  PCP: Eldridge Abrahams, NP  REFERRING PROVIDER: Nicholas Lose, MD  REFERRING DIAG: [phone] (ICD-10-CM) - Malignant neoplasm of upper-outer quadrant of left breast in female, estrogen receptor positive ([place]  THERAPY DIAG:  Aftercare following surgery for neoplasm  Cramp and spasm  Abnormal posture  Stiffness of left shoulder, not elsewhere classified  Rationale for Evaluation and Treatment Rehabilitation  ONSET DATE: shoulder pain began after surgery last month  SUBJECTIVE:                                                                                                                                                                                                          SUBJECTIVE STATEMENT: I had a fill yesterday and my shoulder and pec are tight.      PERTINENT HISTORY:  Patient was diagnosed on 01/12/2021 with left grade II invasive ductal carcinoma breast cancer. She underwent a bilateral nipple sparing mastectomy and left sentinel node biopsy (3 negative nodes) on 05/12/2021 with expanders placed for reconstruction. It is ER/PR positive and HER2 negative with a Ki67 of 1%. Muscle flap reconstruction performed 02/28/09 due to complications. 8/31/211 Left tissue expander exchange with seroma drainage.   PAIN:  Are you having pain? No -a lot of stiffness and pulling.   NPRS- worst 2/ 10   PRECAUTIONS: Other: s/p mastectomy with complications  WEIGHT BEARING RESTRICTIONS none   FALLS:  Has patient fallen in last 6 months? No  LIVING ENVIRONMENT: Lives with: lives with their family Lives in: House/apartment  OCCUPATION: Pt is on FMLA  PLOF: Independent  PATIENT GOALS reduce Lt UE pain, return to prior level of function  OBJECTIVE:   DIAGNOSTIC FINDINGS:  NA  PATIENT SURVEYS:  Quick Dash 36.36  COGNITION: Overall cognitive status: Within functional limits for tasks assessed   SENSATION: WFL  POSTURE: rounded shoulders  OBSERVATION: incision closed and healed   PALPATION: Trigger points in upper trap , edema vs lat flap tissue, left lateral trunk, pec tightness, edema under incision site  CERVICAL ROM:  Pain with Cervical rotation to left    UPPER EXTREMITY ROM:  Active ROM Right eval Left eval 11/23/21  Shoulder flexion  90 150  Shoulder extension     Shoulder abduction  70 173  Shoulder adduction     Shoulder extension     Shoulder internal rotation  To Lt gluteals   Shoulder external rotation   90  Elbow flexion     Elbow extension      Wrist flexion     Wrist extension     Wrist  ulnar deviation     Wrist radial deviation     Wrist pronation     Wrist supination      (Blank rows = not tested)  UPPER EXTREMITY MMT: Initial:Tested in neutral: flexion 4-/5, ext 4/5, IR 4/5, ER 4-/5  TODAY'S TREATMENT:  12/07/2021 Trigger Point Dry-Needling  Treatment instructions: Expect mild to moderate muscle soreness. S/S of pneumothorax if dry needled over a lung field, and to seek immediate medical attention should they occur. Patient verbalized understanding of these instructions and education.  Patient Consent Given: Yes Education handout provided: Yes Muscles treated: Lt bil neck multifidi, upper traps, rhomboids and lats, Lt pec (lateral) Treatment response/outcome: Utilized skilled palpation to identify trigger points.  During dry needling able to palpate muscle twitch and muscle elongation   Skilled palpation and monitoring by PT during dry needling  Manual: Elongation and release to bil neck, Lt pecs  and Lt scapular, scapular mobs  Cervical A/ROM added to HEP  11/30/2021 Trigger Point Dry-Needling  Treatment instructions: Expect mild to moderate muscle soreness. S/S of pneumothorax if dry needled over a lung field, and to seek immediate medical attention should they occur. Patient verbalized understanding of these instructions and education.  Patient Consent Given: Yes Education handout provided: Yes Muscles treated: bil neck multifidi, upper traps, rhomboids and lats Treatment response/outcome: Utilized skilled palpation to identify trigger points.  During dry needling able to palpate muscle twitch and muscle elongation   Skilled palpation and monitoring by PT during dry needling  Manual: Elongation and release to bil neck and Lt scapular, scapular mobs  Cervical A/ROM added to HEP   11/23/2021 Passive ROM and stretching- shoulder flexion  MLD In supine: Short neck, Rt axilla and Lt inguinal nodes, superficial  and deep abdominals, anterior inter-axillary and Lt axillo-inguinal anastomosis, and Lt lateral trunk focusing on areas of swelling redirecting towards anastomosis.   PATIENT EDUCATION:  DN info   HOME EXERCISE PROGRAM: NA- pt has HEP issued in prior PT and PT is checking with MD to determine when she will be allowed to resume.  Access Code: [email or web address] URL: [email or web address] Date: 11/30/2021 Prepared by: Tresa Endo  Exercises - Seated Cervical Flexion AROM  - 3 x daily - 7 x weekly - 1 sets - 3 reps - 20 hold - Seated Cervical Sidebending AROM  - 3 x daily - 7 x weekly - 1 sets - 3 reps - 20 hold - Seated Cervical Rotation AROM  - 3 x daily - 7 x weekly - 1 sets - 3 reps - 20 hold - Seated Correct Posture  - 1 x daily - 7 x weekly - 3 sets - 10 reps ASSESSMENT:  CLINICAL IMPRESSION: Pt had a fill yesterday and reports increased pec tension.  Pt has been stretching a lot and is comfortable with these exercise. Session focused on DN and tissue mobilization of the Lt pec, rhomboids, upper traps and lats.   Pt had good response to DN with twitch response and improved tissue mobility after manual therapy. Pt with extensive trigger points in Lt neck and thoracic spine.   Patient will benefit from skilled PT to address the below impairments and improve overall function.    OBJECTIVE IMPAIRMENTS decreased ROM, decreased strength, increased edema, increased fascial restrictions, increased muscle spasms, postural dysfunction, and pain.   ACTIVITY LIMITATIONS bathing, dressing, reach over head, and hygiene/grooming  PARTICIPATION LIMITATIONS: meal prep, cleaning, driving, and community activity  PERSONAL FACTORS 1 comorbidity: breast surgery with complications  are also affecting patient's functional outcome.   REHAB POTENTIAL: Good  CLINICAL DECISION MAKING: Stable/uncomplicated  EVALUATION COMPLEXITY: Low   GOALS: Goals reviewed with patient? Yes  SHORT TERM GOALS: Target  date: 09/20/2021   Be independent in initial HEP as allowed by MD Baseline:  Goal status: IN PROGRESS  2.  Report > or = to 30% reduction in Lt shoulder/scapular pain with ADLs and self-care Baseline:  Goal status: IN PROGRESS  3.  Demonstrate> or = to 110 degrees of Lt shoulder flexion A/ROM to improve overhead reaching  Baseline: 90 Goal status: MET  4.  Improve DASH to < or = to 40  Baseline: 56 Goal status: MET  LONG TERM GOALS: Target date: 12/28/2021  Be independent in advanced HEP Baseline:  Goal status: INITIAL  2.  Improve Lt shoulder A/ROM flexion to > or = to 135 degrees to improve overhead reaching  Baseline: 90 Goal status: MET  3.  Improve DASH to < or = to 25 Baseline: 56 Goal status: IN PROGRESS  4.  Report < or = to 3/10 Lt shoulder/scapular pain with use  Baseline:  Goal status: MET  5.  Demonstrate Lt shoulder A/ROM IR to > or = to T10 to improve self-care Baseline: gluteals  Goal status: INITIAL     PLAN: PT FREQUENCY: 1-2x/week  PT DURATION: 6 weeks  PLANNED INTERVENTIONS: Therapeutic exercises, Therapeutic activity, Neuromuscular re-education, Patient/Family education, Joint mobilization, Dry Needling, Cryotherapy, Moist heat, scar mobilization, Taping, and Manual therapy  PLAN FOR NEXT SESSION: continue DN, ROM and exercises as allowed by MD, scar mobs   Sigurd Sos, PT 12/07/21 3:28 PM   [facility] [address], [place], [place] [address] Phone # [phone] Fax [phone]

## 2021-12-10 ENCOUNTER — Encounter: Payer: Self-pay | Admitting: Plastic Surgery

## 2021-12-12 ENCOUNTER — Ambulatory Visit (INDEPENDENT_AMBULATORY_CARE_PROVIDER_SITE_OTHER): Payer: BC Managed Care – PPO | Admitting: Surgical

## 2021-12-12 ENCOUNTER — Encounter: Payer: Self-pay | Admitting: Surgical

## 2021-12-12 DIAGNOSIS — Z170 Estrogen receptor positive status [ER+]: Secondary | ICD-10-CM

## 2021-12-12 DIAGNOSIS — C50412 Malignant neoplasm of upper-outer quadrant of left female breast: Secondary | ICD-10-CM

## 2021-12-12 LAB — AEROBIC/ANAEROBIC CULTURE W GRAM STAIN (SURGICAL/DEEP WOUND): Culture: NO GROWTH

## 2021-12-12 NOTE — Progress Notes (Unsigned)
Patient is a 41 year old female here for follow-up on her bilateral breast reconstruction.  She most recently underwent removal and replacement of left breast tissue expander on 10/27/2021 with Dr. Marla Roe.  Of note she has latissimus flap on the left side.  She presents today for concerns of the expander on the right "dropping".  Spoke with the patient on phone earlier, reported she was otherwise doing well but had some concerns about the implant shifting lower on the right side.  At her last appointment we did aspirate approximately 20 cc of cloudy serous fluid from the left breast, this was sent for culture which has had no growth for 4 days.  Still in process.  Gram stain showed WBCs present.  Patient reports she is overall doing well, however was bothered by the right breast expander change in position. She reports she did not start the doxycycline until today because she she was celebrating her birthday this weekend and she did not want to start the antibiotic during that time.  Chaperone present on exam On exam bilateral breast incisions are intact and healing well.  The right breast tissue expander is freely mobile within the right breast pocket.  There is no subcutaneous fluid collection noted.  There is no erythema or cellulitic changes noted.  NAC is viable.  On exam of the left breast, the inframammary fold incisions intact healing well.  She does have some erythema along the superior medial left breast which was present at her last appointment, but it appears worse today.  There was some subcutaneous fluid collection noted on exam today.  Skin is not taut, skin is not thin.  Assessment/plan:  Dr. Marla Roe was consulted during today's appointment for evaluation.  Agrees with removal of 50 cc of fluid from the left breast tissue expander, continue antibiotics.  We discussed culture was negative for bacteria at this time, however WBCs were present.  We discussed careful monitoring of  the area, avoid any compressive garments to avoid excess pressure on the skin.  May be associated with pressure from the expander.  Fluid was removed to help alleviate pressure.  Would like to see patient back in a few days for reevaluation.  Call with questions or concerns. Pictures were obtained of the patient and placed in the chart with the patient's or guardian's permission.  She currently has the following volumes in her tissue expanders: We removed 50 cc from the last tissue expander Right: 570 / 455 cc Left: -50 cc for a total of 255 / 455 cc

## 2021-12-13 ENCOUNTER — Ambulatory Visit: Payer: BC Managed Care – PPO | Admitting: Surgical

## 2021-12-14 ENCOUNTER — Ambulatory Visit: Payer: BC Managed Care – PPO

## 2021-12-15 ENCOUNTER — Telehealth: Payer: Self-pay | Admitting: *Deleted

## 2021-12-15 ENCOUNTER — Ambulatory Visit: Payer: BC Managed Care – PPO | Admitting: Rehabilitation

## 2021-12-15 NOTE — Progress Notes (Deleted)
   Referring Provider Berkley Harvey, NP [place],  [place] [address]   CC: No chief complaint on file.     Haley Evans is an 41 y.o. female.  HPI: Patient is a 41 year old female here for follow-up on her bilateral breast reconstruction.  She most recently underwent removal of replacement of left breast tissue expander on 10/27/2021 with Dr. Marla Roe due to incisional dehiscence.  She was last evaluated in the office on 12/12/2021, 20 cc of fluid was aspirated from the left breast.  On exam she did have some erythema of the superior medial aspect which was worse than her previous visit and concerning for pressure from the expander despite the skin being lax.  The decision was made to remove some fluid to decrease the pressure on the skin.  She is here today for reevaluation    Review of Systems General: ***  Physical Exam    11/21/2021    1:59 PM 10/27/2021    7:15 PM 10/27/2021    7:00 PM  Vitals with BMI  Height '5\' 10"'$     Weight 190 lbs    BMI 25.42    Systolic 706 237 628  Diastolic 86 315 86  Pulse 65 87 78    General:  No acute distress,  Alert and oriented, Non-Toxic, Normal speech and affect ***   Assessment/Plan ***  We placed injectable saline in the Expander using a sterile technique: Right: *** cc for a total of 570 / 455 cc Left: *** cc for a total of 255 / 455 cc   Haley Evans Siah Steely 12/15/2021, 3:12 PM

## 2021-12-15 NOTE — Telephone Encounter (Signed)
Received call from patient stating she would like to switch to Dr. Iran Planas for plastics.  She has seen her before but would like for to take over all her care from a plastics standpoint.  She stated she called her office for an appt and the 1st available was 11/4.  Informed I would reach out and see if he could be seen sooner. Patient very appreciative.  Message sent to Dr. Iran Planas.

## 2021-12-16 ENCOUNTER — Encounter: Payer: BC Managed Care – PPO | Admitting: Physical Medicine & Rehabilitation

## 2021-12-16 ENCOUNTER — Encounter: Payer: BC Managed Care – PPO | Admitting: Surgical

## 2021-12-22 ENCOUNTER — Encounter: Payer: BC Managed Care – PPO | Admitting: Rehabilitation

## 2021-12-27 NOTE — H&P (Addendum)
Subjective:     Patient ID: Haley Evans is a 41 y.o. female.   HPI   Last seen 10/14/2021.    Patient presented following screening detected left breast mass. She elected for bilateral NSM, Left SLN. Final pathology demonstrated 1.9 cm IDC margins negative, 0/3 SLN, ER/PR+, Her2-. Oncotype 13/4%. Patient stopped tamoxifen after 1 month due to HA.    Genetics negative.    Patient underwent bilateral NSM with immediate dual plane TE/ADM reconstruction with Drs. Toth and Dillingham. 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 Underwent debridement mastectomy flap and closure. Underwent subsequent left TE exchange and LD flap 07/18/21. At last visit noted to have opening IMF incision. She was treated by her primary surgeon with antibiotic course, local wound care. She undewent 8.31.23 removal replacement left TE for exposure left TE. Per chart review post op course has been complicated by cellulitis. Underwent aspiration 20 cc "cloudy serous fluid" on 10.10.23. Culture of this no growth.20 cc fluid aspirated from left chest 10.16.23, no culture sent. Off all antibiotics for 1 week. Denies fever.    Prior to mastectomies C cup goal C cup. Notes wt up  50 lb since time of mastectomies, 14 lb up since initial visit in this office.   PMH significant for bipolar d/o, not on medication for while, feels well controlled. Managed by PCP.   Quit smoking Feb 2023. On Chantix.   Review of Systems      Objective:   Physical Exam Cardiovascular:     Rate and Rhythm: Normal rate and regular rhythm.     Heart sounds: Normal heart sounds.  Pulmonary:     Effort: Pulmonary effort is normal.     Breath sounds: Normal breath sounds.  Skin:    Comments: Fitzpatrick 2  Neurological:     Mental Status: She is oriented to person, place, and time.       Chest: right chest expanded benign, animation present Left chest LD skin paddle located caudal to NAC with partal loss NAC SN to nipple R 23  L (remnant areola) 21 cm Nipple to IMF R 6 L 6 midline to nipple R 12 L 12 cm   Left IMF incision healed, left chest upper pole with redness and threatened ulceration through mastectomy flap    Assessment:     Left breast cancer UOQ ER+ S/p bilateral NSM, left SLN, dual plane TE/ADM (Flex HD) reconstruction Skin flap necrosis s/p debridement left mastectomy flap S/p removal/replacement left TE, left LD flap S/p removal/replacement left TE    Plan:     Pictures today. Patient requesting that I assume her care.   Recommend removal left chest tissue expander. In my opinions she has had contamination of cavity due to skin flap necrosis not stable soft tissue coverage. Give the prior exposure TE at [facility] and now threatened ulceration superior pole, I suspect her latissimus muscle has contracted significantly. Recommend allow soft tissue to recover and reassess in few months time. Plan OR this week.    Mentor Artoura Plus Smooth Ultra High Profile 455 cc TEplaced bilateral RIGHT fill volume saline 570 ml LEFT fill volume saline 255 ml

## 2021-12-28 ENCOUNTER — Encounter (HOSPITAL_BASED_OUTPATIENT_CLINIC_OR_DEPARTMENT_OTHER): Payer: Self-pay | Admitting: Plastic Surgery

## 2021-12-28 ENCOUNTER — Other Ambulatory Visit: Payer: Self-pay

## 2021-12-28 ENCOUNTER — Encounter: Payer: Self-pay | Admitting: Physical Medicine & Rehabilitation

## 2021-12-29 ENCOUNTER — Telehealth: Payer: Self-pay | Admitting: Physical Medicine & Rehabilitation

## 2021-12-29 MED ORDER — OXYCODONE HCL 5 MG PO TABS: 5.0000 mg | ORAL_TABLET | Freq: Four times a day (QID) | ORAL | 0 refills | Status: AC | PRN

## 2021-12-29 NOTE — Progress Notes (Signed)

## 2021-12-29 NOTE — Telephone Encounter (Signed)
Patient has sent mychart message to provider. She is having surgery, and would like to discuss getting medication for pain until surgery

## 2021-12-30 ENCOUNTER — Ambulatory Visit (HOSPITAL_BASED_OUTPATIENT_CLINIC_OR_DEPARTMENT_OTHER): Payer: BC Managed Care – PPO | Admitting: Anesthesiology

## 2021-12-30 ENCOUNTER — Encounter (HOSPITAL_BASED_OUTPATIENT_CLINIC_OR_DEPARTMENT_OTHER): Payer: Self-pay | Admitting: Plastic Surgery

## 2021-12-30 ENCOUNTER — Ambulatory Visit (HOSPITAL_BASED_OUTPATIENT_CLINIC_OR_DEPARTMENT_OTHER)
Admission: RE | Admit: 2021-12-30 | Discharge: 2021-12-30 | Disposition: A | Discharge status: Home / Self Care | Payer: BC Managed Care – PPO | Attending: Plastic Surgery | Admitting: Plastic Surgery

## 2021-12-30 ENCOUNTER — Other Ambulatory Visit: Payer: Self-pay

## 2021-12-30 ENCOUNTER — Encounter (HOSPITAL_BASED_OUTPATIENT_CLINIC_OR_DEPARTMENT_OTHER)
Admission: RE | Disposition: A | Discharge status: Home / Self Care | Payer: Self-pay | Source: Home / Self Care | Attending: Plastic Surgery

## 2021-12-30 DIAGNOSIS — Z853 Personal history of malignant neoplasm of breast: Secondary | ICD-10-CM | POA: Insufficient documentation

## 2021-12-30 DIAGNOSIS — Z87891 Personal history of nicotine dependence: Secondary | ICD-10-CM | POA: Insufficient documentation

## 2021-12-30 DIAGNOSIS — Z45812 Encounter for adjustment or removal of left breast implant: Secondary | ICD-10-CM | POA: Diagnosis present

## 2021-12-30 DIAGNOSIS — Y834 Other reconstructive surgery as the cause of abnormal reaction of the patient, or of later complication, without mention of misadventure at the time of the procedure: Secondary | ICD-10-CM | POA: Insufficient documentation

## 2021-12-30 DIAGNOSIS — Z79899 Other long term (current) drug therapy: Secondary | ICD-10-CM | POA: Diagnosis not present

## 2021-12-30 DIAGNOSIS — M96843 Postprocedural seroma of a musculoskeletal structure following other procedure: Secondary | ICD-10-CM | POA: Diagnosis not present

## 2021-12-30 DIAGNOSIS — Z01818 Encounter for other preprocedural examination: Secondary | ICD-10-CM

## 2021-12-30 HISTORY — PX: BREAST IMPLANT REMOVAL: SHX5361

## 2021-12-30 LAB — POCT PREGNANCY, URINE: Preg Test, Ur: NEGATIVE

## 2021-12-30 SURGERY — REMOVAL, IMPLANT, BREAST
Anesthesia: General | Laterality: Left

## 2021-12-30 MED ORDER — LIDOCAINE 2% (20 MG/ML) 5 ML SYRINGE
INTRAMUSCULAR | Status: AC
  Filled 2021-12-30: qty 10

## 2021-12-30 MED ORDER — LACTATED RINGERS IV SOLN: INTRAVENOUS | Status: DC | PRN

## 2021-12-30 MED ORDER — SODIUM CHLORIDE 0.9 % IV SOLN
INTRAVENOUS | Status: AC
  Filled 2021-12-30: qty 10

## 2021-12-30 MED ORDER — HYDROMORPHONE HCL 1 MG/ML IJ SOLN
INTRAMUSCULAR | Status: AC
  Filled 2021-12-30: qty 0.5

## 2021-12-30 MED ORDER — PROPOFOL 10 MG/ML IV BOLUS
INTRAVENOUS | Status: AC
  Filled 2021-12-30: qty 20

## 2021-12-30 MED ORDER — HYDROMORPHONE HCL 1 MG/ML IJ SOLN
INTRAMUSCULAR | Status: DC | PRN
  Administered 2021-12-30 (×2): .25 mg via INTRAVENOUS

## 2021-12-30 MED ORDER — ONDANSETRON HCL 4 MG/2ML IJ SOLN: 4.0000 mg | Freq: Four times a day (QID) | INTRAMUSCULAR | Status: DC | PRN

## 2021-12-30 MED ORDER — CELECOXIB 200 MG PO CAPS
200.0000 mg | ORAL_CAPSULE | ORAL | Status: AC
  Administered 2021-12-30: 200 mg via ORAL

## 2021-12-30 MED ORDER — ONDANSETRON HCL 4 MG/2ML IJ SOLN
INTRAMUSCULAR | Status: DC | PRN
  Administered 2021-12-30: 4 mg via INTRAVENOUS

## 2021-12-30 MED ORDER — ACETAMINOPHEN 500 MG PO TABS
1000.0000 mg | ORAL_TABLET | ORAL | Status: AC
  Administered 2021-12-30: 1000 mg via ORAL

## 2021-12-30 MED ORDER — GABAPENTIN 300 MG PO CAPS: 300.0000 mg | ORAL_CAPSULE | ORAL | Status: DC

## 2021-12-30 MED ORDER — MIDAZOLAM HCL 2 MG/2ML IJ SOLN
INTRAMUSCULAR | Status: AC
  Filled 2021-12-30: qty 2

## 2021-12-30 MED ORDER — ACETAMINOPHEN 160 MG/5ML PO SOLN: 325.0000 mg | Freq: Once | ORAL | Status: AC

## 2021-12-30 MED ORDER — MIDAZOLAM HCL 2 MG/2ML IJ SOLN
INTRAMUSCULAR | Status: DC | PRN
  Administered 2021-12-30: 2 mg via INTRAVENOUS

## 2021-12-30 MED ORDER — LACTATED RINGERS IV SOLN
INTRAVENOUS | Status: DC
Start: 2021-12-30 — End: 2021-12-30

## 2021-12-30 MED ORDER — HYDROMORPHONE HCL 1 MG/ML IJ SOLN: 0.5000 mg | INTRAMUSCULAR | Status: DC | PRN

## 2021-12-30 MED ORDER — HYDROMORPHONE HCL 1 MG/ML IJ SOLN
0.5000 mg | Freq: Once | INTRAMUSCULAR | Status: AC
  Administered 2021-12-30: 0.5 mg via INTRAVENOUS

## 2021-12-30 MED ORDER — FENTANYL CITRATE (PF) 100 MCG/2ML IJ SOLN
INTRAMUSCULAR | Status: AC
  Filled 2021-12-30: qty 2

## 2021-12-30 MED ORDER — OXYCODONE HCL 5 MG PO TABS
ORAL_TABLET | ORAL | Status: AC
  Filled 2021-12-30: qty 1

## 2021-12-30 MED ORDER — CHLORHEXIDINE GLUCONATE CLOTH 2 % EX PADS: 6.0000 | MEDICATED_PAD | Freq: Once | CUTANEOUS | Status: DC

## 2021-12-30 MED ORDER — OXYCODONE HCL 5 MG PO TABS
5.0000 mg | ORAL_TABLET | ORAL | Status: DC | PRN
  Administered 2021-12-30: 10 mg via ORAL
  Administered 2021-12-30: 5 mg via ORAL
  Filled 2021-12-30: qty 2
  Filled 2021-12-30: qty 1

## 2021-12-30 MED ORDER — GABAPENTIN 300 MG PO CAPS
300.0000 mg | ORAL_CAPSULE | Freq: Three times a day (TID) | ORAL | Status: DC
  Administered 2021-12-30: 300 mg via ORAL
  Filled 2021-12-30: qty 1

## 2021-12-30 MED ORDER — GABAPENTIN 300 MG PO CAPS
ORAL_CAPSULE | ORAL | Status: AC
  Filled 2021-12-30: qty 1

## 2021-12-30 MED ORDER — AMISULPRIDE (ANTIEMETIC) 5 MG/2ML IV SOLN: 10.0000 mg | Freq: Once | INTRAVENOUS | Status: DC | PRN

## 2021-12-30 MED ORDER — CELECOXIB 200 MG PO CAPS
ORAL_CAPSULE | ORAL | Status: AC
  Filled 2021-12-30: qty 1

## 2021-12-30 MED ORDER — ONDANSETRON HCL 4 MG/2ML IJ SOLN
INTRAMUSCULAR | Status: AC
  Filled 2021-12-30: qty 2

## 2021-12-30 MED ORDER — OXYCODONE HCL 5 MG PO TABS
5.0000 mg | ORAL_TABLET | Freq: Once | ORAL | Status: AC | PRN
  Administered 2021-12-30: 5 mg via ORAL

## 2021-12-30 MED ORDER — VANCOMYCIN HCL IN DEXTROSE 1-5 GM/200ML-% IV SOLN
INTRAVENOUS | Status: AC
  Filled 2021-12-30: qty 200

## 2021-12-30 MED ORDER — FENTANYL CITRATE (PF) 100 MCG/2ML IJ SOLN
INTRAMUSCULAR | Status: DC | PRN
  Administered 2021-12-30: 25 ug via INTRAVENOUS
  Administered 2021-12-30: 50 ug via INTRAVENOUS
  Administered 2021-12-30: 25 ug via INTRAVENOUS

## 2021-12-30 MED ORDER — DEXAMETHASONE SODIUM PHOSPHATE 10 MG/ML IJ SOLN
INTRAMUSCULAR | Status: AC
  Filled 2021-12-30: qty 1

## 2021-12-30 MED ORDER — ACETAMINOPHEN 500 MG PO TABS
ORAL_TABLET | ORAL | Status: AC
  Filled 2021-12-30: qty 2

## 2021-12-30 MED ORDER — VANCOMYCIN HCL IN DEXTROSE 1-5 GM/200ML-% IV SOLN
1000.0000 mg | INTRAVENOUS | Status: AC
  Administered 2021-12-30: 1000 mg via INTRAVENOUS

## 2021-12-30 MED ORDER — DEXAMETHASONE SODIUM PHOSPHATE 10 MG/ML IJ SOLN
INTRAMUSCULAR | Status: DC | PRN
  Administered 2021-12-30: 5 mg via INTRAVENOUS

## 2021-12-30 MED ORDER — PROPOFOL 10 MG/ML IV BOLUS
INTRAVENOUS | Status: DC | PRN
  Administered 2021-12-30: 170 mg via INTRAVENOUS

## 2021-12-30 MED ORDER — SODIUM CHLORIDE 0.9 % IV SOLN
INTRAVENOUS | Status: DC | PRN
  Administered 2021-12-30: 500 mL

## 2021-12-30 MED ORDER — KETOROLAC TROMETHAMINE 30 MG/ML IJ SOLN
30.0000 mg | Freq: Three times a day (TID) | INTRAMUSCULAR | Status: DC
  Administered 2021-12-30: 30 mg via INTRAVENOUS
  Filled 2021-12-30: qty 1

## 2021-12-30 MED ORDER — FENTANYL CITRATE (PF) 100 MCG/2ML IJ SOLN
25.0000 ug | INTRAMUSCULAR | Status: DC | PRN
  Administered 2021-12-30 (×3): 50 ug via INTRAVENOUS

## 2021-12-30 MED ORDER — HYDROXYZINE HCL 25 MG PO TABS: 50.0000 mg | ORAL_TABLET | Freq: Every day | ORAL | Status: DC

## 2021-12-30 MED ORDER — KCL IN DEXTROSE-NACL 20-5-0.45 MEQ/L-%-% IV SOLN
INTRAVENOUS | Status: DC
  Filled 2021-12-30: qty 1000

## 2021-12-30 MED ORDER — CYCLOBENZAPRINE HCL 10 MG PO TABS
10.0000 mg | ORAL_TABLET | Freq: Two times a day (BID) | ORAL | Status: DC | PRN
  Administered 2021-12-30: 10 mg via ORAL
  Filled 2021-12-30: qty 1

## 2021-12-30 MED ORDER — OXYCODONE HCL 5 MG/5ML PO SOLN: 5.0000 mg | Freq: Once | ORAL | Status: AC | PRN

## 2021-12-30 MED ORDER — ACETAMINOPHEN 325 MG PO TABS
325.0000 mg | ORAL_TABLET | Freq: Once | ORAL | Status: AC
  Administered 2021-12-30: 650 mg via ORAL
  Filled 2021-12-30: qty 2

## 2021-12-30 MED ORDER — ONDANSETRON 4 MG PO TBDP: 4.0000 mg | ORAL_TABLET | Freq: Four times a day (QID) | ORAL | Status: DC | PRN

## 2021-12-30 MED ORDER — SCOPOLAMINE 1 MG/3DAYS TD PT72: 1.0000 | MEDICATED_PATCH | TRANSDERMAL | Status: DC

## 2021-12-30 MED ORDER — DIAZEPAM 2 MG PO TABS
2.0000 mg | ORAL_TABLET | Freq: Four times a day (QID) | ORAL | Status: DC | PRN
  Administered 2021-12-30: 2 mg via ORAL
  Filled 2021-12-30: qty 1

## 2021-12-30 MED ORDER — PROMETHAZINE HCL 25 MG/ML IJ SOLN: 6.2500 mg | INTRAMUSCULAR | Status: DC | PRN

## 2021-12-30 MED ORDER — BUPIVACAINE HCL (PF) 0.5 % IJ SOLN
INTRAMUSCULAR | Status: DC | PRN
  Administered 2021-12-30: 20 mL

## 2021-12-30 MED ORDER — ACETAMINOPHEN 10 MG/ML IV SOLN: 1000.0000 mg | Freq: Once | INTRAVENOUS | Status: DC | PRN

## 2021-12-30 MED ORDER — BUPIVACAINE HCL (PF) 0.5 % IJ SOLN
INTRAMUSCULAR | Status: AC
  Filled 2021-12-30: qty 30

## 2021-12-30 MED ORDER — ALPRAZOLAM 0.25 MG PO TABS: 0.5000 mg | ORAL_TABLET | Freq: Two times a day (BID) | ORAL | Status: DC | PRN

## 2021-12-30 SURGICAL SUPPLY — 65 items
ADH SKN CLS APL DERMABOND .7 (GAUZE/BANDAGES/DRESSINGS) ×1
APL PRP STRL LF DISP 70% ISPRP (MISCELLANEOUS) ×1
BAG DECANTER FOR FLEXI CONT (MISCELLANEOUS) ×2 IMPLANT
BINDER BREAST LRG (GAUZE/BANDAGES/DRESSINGS) IMPLANT
BINDER BREAST MEDIUM (GAUZE/BANDAGES/DRESSINGS) IMPLANT
BINDER BREAST XLRG (GAUZE/BANDAGES/DRESSINGS) IMPLANT
BINDER BREAST XXLRG (GAUZE/BANDAGES/DRESSINGS) IMPLANT
BLADE SURG 10 STRL SS (BLADE) ×2 IMPLANT
BLADE SURG 15 STRL LF DISP TIS (BLADE) IMPLANT
BLADE SURG 15 STRL SS (BLADE) ×1
BNDG GAUZE DERMACEA FLUFF 4 (GAUZE/BANDAGES/DRESSINGS) IMPLANT
BNDG GZE DERMACEA 4 6PLY (GAUZE/BANDAGES/DRESSINGS)
CANISTER SUCT 1200ML W/VALVE (MISCELLANEOUS) ×2 IMPLANT
CHLORAPREP W/TINT 26 (MISCELLANEOUS) ×2 IMPLANT
COVER BACK TABLE 60X90IN (DRAPES) ×2 IMPLANT
COVER MAYO STAND STRL (DRAPES) ×2 IMPLANT
DERMABOND ADVANCED .7 DNX12 (GAUZE/BANDAGES/DRESSINGS) ×2 IMPLANT
DRAIN CHANNEL 15F RND FF W/TCR (WOUND CARE) IMPLANT
DRAIN CHANNEL 19F RND (DRAIN) IMPLANT
DRAPE INCISE IOBAN 66X45 STRL (DRAPES) IMPLANT
DRAPE TOP ARMCOVERS (MISCELLANEOUS) ×2 IMPLANT
DRAPE U-SHAPE 76X120 STRL (DRAPES) ×2 IMPLANT
DRAPE UTILITY XL STRL (DRAPES) ×2 IMPLANT
ELECT BLADE 4.0 EZ CLEAN MEGAD (MISCELLANEOUS)
ELECT BLADE 6.5 EXT (BLADE) IMPLANT
ELECT COATED BLADE 2.86 ST (ELECTRODE) ×2 IMPLANT
ELECT REM PT RETURN 9FT ADLT (ELECTROSURGICAL) ×1
ELECTRODE BLDE 4.0 EZ CLN MEGD (MISCELLANEOUS) IMPLANT
ELECTRODE REM PT RTRN 9FT ADLT (ELECTROSURGICAL) ×2 IMPLANT
EVACUATOR SILICONE 100CC (DRAIN) IMPLANT
GAUZE PAD ABD 8X10 STRL (GAUZE/BANDAGES/DRESSINGS) ×2 IMPLANT
GAUZE SPONGE 4X4 12PLY STRL (GAUZE/BANDAGES/DRESSINGS) IMPLANT
GAUZE SPONGE 4X4 12PLY STRL LF (GAUZE/BANDAGES/DRESSINGS) IMPLANT
GLOVE BIO SURGEON STRL SZ 6 (GLOVE) ×2 IMPLANT
GOWN STRL REUS W/ TWL LRG LVL3 (GOWN DISPOSABLE) ×4 IMPLANT
GOWN STRL REUS W/TWL LRG LVL3 (GOWN DISPOSABLE) ×2
MARKER SKIN DUAL TIP RULER LAB (MISCELLANEOUS) IMPLANT
NDL HYPO 25X1 1.5 SAFETY (NEEDLE) IMPLANT
NEEDLE HYPO 25X1 1.5 SAFETY (NEEDLE) ×1 IMPLANT
NS IRRIG 1000ML POUR BTL (IV SOLUTION) IMPLANT
PACK BASIN DAY SURGERY FS (CUSTOM PROCEDURE TRAY) ×2 IMPLANT
PENCIL SMOKE EVACUATOR (MISCELLANEOUS) ×2 IMPLANT
PIN SAFETY STERILE (MISCELLANEOUS) IMPLANT
SHEET MEDIUM DRAPE 40X70 STRL (DRAPES) ×2 IMPLANT
SLEEVE SCD COMPRESS KNEE MED (STOCKING) ×2 IMPLANT
SPIKE FLUID TRANSFER (MISCELLANEOUS) IMPLANT
SPONGE T-LAP 18X18 ~~LOC~~+RFID (SPONGE) ×4 IMPLANT
STAPLER VISISTAT 35W (STAPLE) ×2 IMPLANT
STRIP CLOSURE SKIN 1/2X4 (GAUZE/BANDAGES/DRESSINGS) IMPLANT
SUT ETHILON 2 0 FS 18 (SUTURE) IMPLANT
SUT MNCRL AB 4-0 PS2 18 (SUTURE) ×2 IMPLANT
SUT VIC AB 3-0 PS1 18 (SUTURE)
SUT VIC AB 3-0 PS1 18XBRD (SUTURE) IMPLANT
SUT VIC AB 3-0 SH 27 (SUTURE)
SUT VIC AB 3-0 SH 27X BRD (SUTURE) IMPLANT
SUT VICRYL 4-0 PS2 18IN ABS (SUTURE) ×2 IMPLANT
SWAB COLLECTION DEVICE MRSA (MISCELLANEOUS) IMPLANT
SWAB CULTURE ESWAB REG 1ML (MISCELLANEOUS) IMPLANT
SYR 50ML LL SCALE MARK (SYRINGE) IMPLANT
SYR BULB IRRIG 60ML STRL (SYRINGE) ×2 IMPLANT
SYR CONTROL 10ML LL (SYRINGE) IMPLANT
TOWEL GREEN STERILE FF (TOWEL DISPOSABLE) ×4 IMPLANT
TUBE CONNECTING 20X1/4 (TUBING) ×2 IMPLANT
UNDERPAD 30X36 HEAVY ABSORB (UNDERPADS AND DIAPERS) ×4 IMPLANT
YANKAUER SUCT BULB TIP NO VENT (SUCTIONS) ×2 IMPLANT

## 2021-12-30 NOTE — Op Note (Signed)
Operative Note   DATE OF OPERATION: 11.3.23  LOCATION: [facility]-outpatient  SURGICAL DIVISION: Plastic Surgery  PREOPERATIVE DIAGNOSES:  1. History breast cancer 2. Acquired absence breasts  POSTOPERATIVE DIAGNOSES:  . History breast cancer 2. Acquired absence breasts 3. Seroma left chest  PROCEDURE:  Removal left chest tissue expander  SURGEON: Irene Limbo MD MBA  ASSISTANT: none  ANESTHESIA:  General.   EBL: 40 ml  COMPLICATIONS: None immediate.   INDICATIONS FOR PROCEDURE:  The patient, Haley Evans, is a 41 y.o. female born on 1980/10/31, is here for removal left chest tissue expander for threatened ulceration through mastectomy skin flap.   FINDINGS: Cloudy seroma fluid with in chest. No muscular coverage present beneath mastectomy flap in upper inner left chest.  DESCRIPTION OF PROCEDURE:  The patient's operative site was marked with the patient in the preoperative area. The patient was taken to the operating room. SCDs were placed and IV antibiotics were given. The patient's operative site was prepped and draped in a sterile fashion. A time out was performed and all information was confirmed to be correct.  Incision made in left chest inframammary scar and carried through superficial fascia and latissimus muscle to expander cavity. Cloudy fluid encountered and cultures obtained. Expander removed. Curettage performed to cavity. Cavity irrigated with saline solution containing Ancef gentamicin and Betadine. Local anesthetic infiltrated. 19 Fr JP drain placed percutaneously and secured with 2-0 nylon. Closure completed with 3-0 monocryl in superficial fascia and dermis. 4-0 monocryl subcuticular skin closure completed. Dermabond, dry dressing, and breast binder applied.  The patient was allowed to wake from anesthesia, extubated and taken to the recovery room in satisfactory condition.   SPECIMENS: culture left chest  DRAINS: 22 Fr JP.  Irene Limbo, MD  [facility]  Office/ physician access line after hours [phone]

## 2021-12-30 NOTE — Discharge Instructions (Signed)

## 2021-12-30 NOTE — Interval H&P Note (Signed)
History and Physical Interval Note:  12/30/2021 10:33 AM  Haley Evans  has presented today for surgery, with the diagnosis of HISTORY OF BREAST CANCER.  The various methods of treatment have been discussed with the patient and family. After consideration of risks, benefits and other options for treatment, the patient has consented to  Procedure(s): REMOVAL LEFT CHEST TISSUE EXPANDER (Left) as a surgical intervention.  The patient's history has been reviewed, patient examined, no change in status, stable for surgery.  I have reviewed the patient's chart and labs.  Questions were answered to the patient's satisfaction.     Arnoldo Hooker Rock Sobol

## 2021-12-30 NOTE — Anesthesia Preprocedure Evaluation (Addendum)
Anesthesia Evaluation  Patient identified by MRN, date of birth, ID band Patient awake    Reviewed: Allergy & Precautions, NPO status , Patient's Chart, lab work & pertinent test results  Airway Mallampati: II  TM Distance: >3 FB   Mouth opening: Limited Mouth Opening  Dental  (+) Teeth Intact, Dental Advisory Given   Pulmonary Patient abstained from smoking., former smoker   breath sounds clear to auscultation       Cardiovascular negative cardio ROS  Rhythm:Regular Rate:Normal     Neuro/Psych Seizures -,  PSYCHIATRIC DISORDERS Anxiety Depression Bipolar Disorder      GI/Hepatic negative GI ROS, Neg liver ROS,,,  Endo/Other  negative endocrine ROS    Renal/GU negative Renal ROS     Musculoskeletal negative musculoskeletal ROS (+)    Abdominal   Peds  Hematology   Anesthesia Other Findings   Reproductive/Obstetrics                             Anesthesia Physical Anesthesia Plan  ASA: 2  Anesthesia Plan: General   Post-op Pain Management:    Induction: Intravenous  PONV Risk Score and Plan: 4 or greater and Ondansetron, Dexamethasone, Midazolam and Scopolamine patch - Pre-op  Airway Management Planned: Oral ETT and LMA  Additional Equipment: None  Intra-op Plan:   Post-operative Plan: Extubation in OR  Informed Consent: I have reviewed the patients History and Physical, chart, labs and discussed the procedure including the risks, benefits and alternatives for the proposed anesthesia with the patient or authorized representative who has indicated his/her understanding and acceptance.     Dental advisory given  Plan Discussed with: CRNA  Anesthesia Plan Comments:        Anesthesia Quick Evaluation

## 2021-12-30 NOTE — Transfer of Care (Signed)
Immediate Anesthesia Transfer of Care Note  Patient: Haley Evans  Procedure(s) Performed: REMOVAL LEFT CHEST TISSUE EXPANDER (Left)  Patient Location: PACU  Anesthesia Type:General  Level of Consciousness: awake, alert , and oriented  Airway & Oxygen Therapy: Patient Spontanous Breathing and Patient connected to face mask oxygen  Post-op Assessment: Report given to RN and Post -op Vital signs reviewed and stable  Post vital signs: Reviewed and stable  Last Vitals:  Vitals Value Taken Time  BP    Temp    Pulse 104 12/30/21 1206  Resp 18 12/30/21 1206  SpO2 100 % 12/30/21 1206  Vitals shown include unvalidated device data.  Last Pain:  Vitals:   12/30/21 1014  TempSrc: Oral  PainSc: 8       Patients Stated Pain Goal: 4 (91/63/84 6659)  Complications: No notable events documented.

## 2021-12-31 ENCOUNTER — Encounter (HOSPITAL_BASED_OUTPATIENT_CLINIC_OR_DEPARTMENT_OTHER): Payer: Self-pay | Admitting: Plastic Surgery

## 2021-12-31 NOTE — Anesthesia Postprocedure Evaluation (Signed)
Anesthesia Post Note  Patient: Cytogeneticist  Procedure(s) Performed: REMOVAL LEFT CHEST TISSUE EXPANDER (Left)     Patient location during evaluation: PACU Anesthesia Type: General Level of consciousness: awake and alert Pain management: pain level controlled Vital Signs Assessment: post-procedure vital signs reviewed and stable Respiratory status: spontaneous breathing, nonlabored ventilation, respiratory function stable and patient connected to nasal cannula oxygen Cardiovascular status: blood pressure returned to baseline and stable Postop Assessment: no apparent nausea or vomiting Anesthetic complications: no   No notable events documented.  Last Vitals:  Vitals:   12/30/21 1445 12/30/21 1700  BP:  126/86  Pulse: 86 88  Resp:  16  Temp:  37.2 C  SpO2: 98% 97%    Last Pain:  Vitals:   12/30/21 1745  TempSrc:   PainSc: [place]

## 2022-01-02 ENCOUNTER — Encounter: Payer: Self-pay | Admitting: Physical Medicine & Rehabilitation

## 2022-01-06 LAB — AEROBIC/ANAEROBIC CULTURE W GRAM STAIN (SURGICAL/DEEP WOUND): Gram Stain: NONE SEEN

## 2022-01-10 ENCOUNTER — Other Ambulatory Visit (HOSPITAL_COMMUNITY): Payer: Self-pay | Admitting: Physical Medicine & Rehabilitation

## 2022-01-10 ENCOUNTER — Encounter: Payer: Self-pay | Admitting: Physical Medicine & Rehabilitation

## 2022-01-10 MED ORDER — GABAPENTIN 100 MG PO CAPS: 300.0000 mg | ORAL_CAPSULE | Freq: Three times a day (TID) | ORAL | 3 refills | Status: DC

## 2022-01-11 ENCOUNTER — Telehealth (HOSPITAL_COMMUNITY): Payer: Self-pay | Admitting: Physical Medicine & Rehabilitation

## 2022-01-11 MED ORDER — OXYCODONE HCL 5 MG PO TABS: 5.0000 mg | ORAL_TABLET | Freq: Four times a day (QID) | ORAL | 0 refills | Status: AC | PRN

## 2022-01-11 MED ORDER — TIZANIDINE HCL 2 MG PO CAPS: 2.0000 mg | ORAL_CAPSULE | Freq: Three times a day (TID) | ORAL | 0 refills | Status: DC | PRN

## 2022-01-11 NOTE — Telephone Encounter (Signed)
Called Haley Evans to discuss her pain. She is having continued pain in her chest wall. Recently had surgery on 12/30/21. Pain is gradually improving. Oxycodone '5mg'$  has been helping keep her pain under control. She is only using it when pain is severe. Will send short term order for oxycodone '5mg'$  QID PRN for 3 days for acute pain.  She also reports flexeril is not helping for muscle spasms. Will order  tizanidine '2mg'$  and discontinue flexeril.

## 2022-01-12 NOTE — Telephone Encounter (Signed)
Called to check on patient.  Left message. ?

## 2022-01-22 ENCOUNTER — Encounter: Payer: Self-pay | Admitting: Physical Therapy

## 2022-01-22 NOTE — Therapy (Signed)
OUTPATIENT PT UPPER EXTREMITY LYMPHEDEMA EVALUATION  Patient Name: Haley Evans MRN: [id number] DOB:25-Jun-1980, 41 y.o., female Today's Date: 01/23/2022  END OF SESSION:  PT End of Session - 01/23/22 1212     Visit Number 1    Number of Visits 9    Date for PT Re-Evaluation 03/20/22    PT Start Time 1208    PT Stop Time 1255    PT Time Calculation (min) 47 min    Activity Tolerance Patient tolerated treatment well    Behavior During Therapy [facility] for tasks assessed/performed             Past Medical History:  Diagnosis Date   Anemia    Anxiety    Bipolar 2 disorder ([place])    Cancer ([place])    breast   Depression    Family history of adverse reaction to anesthesia    aunt woke up during hip replacement   Seizure ([place])    vs syncopy, age 31 and 12/2017, Seen by neurologist, a definitive diagnosis was not made. Pt reports having seizure aroudn 2021   Past Surgical History:  Procedure Laterality Date   ARM WOUND REPAIR / CLOSURE Right    fractured arm   BREAST IMPLANT REMOVAL Left 12/30/2021   Procedure: REMOVAL LEFT CHEST TISSUE EXPANDER;  Surgeon: Irene Limbo, MD;  Location: [place];  Service: Plastics;  Laterality: Left;   BREAST RECONSTRUCTION WITH PLACEMENT OF TISSUE EXPANDER AND FLEX HD (ACELLULAR HYDRATED DERMIS) Left 06/15/2021   Procedure: REPLACEMENT OF TISSUE EXPANDER AND FLEX HD (ACELLULAR HYDRATED DERMIS);  Surgeon: Wallace Going, DO;  Location: [place];  Service: Plastics;  Laterality: Left;   CESAREAN SECTION     DEBRIDEMENT AND CLOSURE WOUND Left 06/15/2021   Procedure: DEBRIDEMENT AND CLOSURE LEFT BREAST WOUND;  Surgeon: Wallace Going, DO;  Location: [place];  Service: Plastics;  Laterality: Left;  1 hour   LATISSIMUS FLAP TO BREAST Left 07/18/2021   Procedure: LATISSIMUS FLAP TO BREAST;  Surgeon: Wallace Going, DO;  Location: [place];  Service: Plastics;  Laterality: Left;   NIPPLE SPARING MASTECTOMY Right 05/12/2021    Procedure: RIGHT NIPPLE SPARING MASTECTOMY;  Surgeon: Jovita Kussmaul, MD;  Location: [place];  Service: General;  Laterality: Right;   NIPPLE SPARING MASTECTOMY WITH SENTINEL LYMPH NODE BIOPSY Left 05/12/2021   Procedure: LEFT NIPPLE SPARING MASTECTOMY WITH LEFT SENTINEL LYMPH NODE BIOPSY;  Surgeon: Jovita Kussmaul, MD;  Location: [place];  Service: General;  Laterality: Left;   PRE-PECTORAL BREAST RECON W/ PLACEMENT OF TISSUE EXPANDERS, DERMAL GRAFT Bilateral 05/12/2021   Procedure: IMMEDIATE BILATERAL BREAST RECONSTRUCTION WITH PLACEMENT OF TISSUE EXPANDERS AND FLEX HD;  Surgeon: Wallace Going, DO;  Location: [place];  Service: Plastics;  Laterality: Bilateral;   REMOVAL OF TISSUE EXPANDER Left 10/27/2021   Procedure: Left breast seroma drainage with tissue expander exchange;  Surgeon: Wallace Going, DO;  Location: [place];  Service: Plastics;  Laterality: Left;   TISSUE EXPANDER PLACEMENT Left 07/18/2021   Procedure: REMOVAL AND REPLACEMENT OF TISSUE EXPANDER;  Surgeon: Wallace Going, DO;  Location: [place];  Service: Plastics;  Laterality: Left;  3 hours   Patient Active Problem List   Diagnosis Date Noted   History of breast cancer 12/30/2021   Admission for removal of tissue expander of breast, unspecified laterality 07/18/2021   Acquired absence of left breast 07/18/2021   Open wound of left breast 07/15/2021  Breast cancer ([place]) 05/12/2021   Genetic testing 03/18/2021   Family history of pancreatic cancer 03/10/2021   Malignant neoplasm of upper-outer quadrant of left breast in female, estrogen receptor positive ([place]) 03/07/2021   Anxiety 06/25/2011    REFERRING PROVIDER: Irene Limbo  REFERRING DIAG: Left breast cancer  THERAPY DIAG:  Aftercare following surgery for neoplasm  Cramp and spasm  Abnormal posture  Stiffness of left shoulder, not elsewhere classified  Malignant neoplasm of upper-outer  quadrant of left breast in female, estrogen receptor positive ([place])  ONSET DATE: 12/30/2021  Rationale for Evaluation and Treatment: Rehabilitation  SUBJECTIVE                                                                                                                                                                                           SUBJECTIVE STATEMENT: Patient has a very complicated course of breast cancer treatment. Patient was diagnosed on 01/12/2021 with left grade II invasive ductal carcinoma breast cancer. She underwent a bilateral nipple sparing mastectomy and left sentinel node biopsy (3 negative nodes) on 05/12/2021 with expanders placed for reconstruction. It is ER/PR positive and HER2 negative with a Ki67 of 1%. On 05/28/2021, her left expander was replaced and tissue debrided. Latissimus muscle flap reconstruction performed 07/19/21 on the left side due to complications. 10/27/2021 Left tissue expander exchange with seroma drainage. Due to complications, she had her left tissue expander removed on 12/30/2021 and is being referred to a microsurgeon.  PERTINENT HISTORY: Bilateral nipple sparing mastectomy and left sentinel node biopsy (3 negative nodes) on 05/12/2021 with expanders placed for reconstruction. It is ER/PR positive and HER2 negative with a Ki67 of 1%. Muscle flap reconstruction on left performed 06/06/79 due to complications. 10/27/2021 Left tissue expander exchange with seroma drainage. 12/30/2021 - left tissue expander removed.  PAIN:  Are you having pain? Yes NPRS scale: 6/10 Pain location: Left chest and somewhat on the right chest Pain orientation: Bilateral  PAIN TYPE: tingling and stinging Pain description: constant  Aggravating factors: Nothing really makes it worse; kind of constant Relieving factors: medication  PRECAUTIONS: Other: Left arm lymphedema risk  WEIGHT BEARING RESTRICTIONS: No  FALLS:  Has patient fallen in last 6 months? 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 Incisions appear to be healing well. No signs of infection.  OBSERVATIONS / OTHER ASSESSMENTS: Left scapula with tightness and poor mobility along her ribs posteriorly.  SENSATION: Hypersensitivity to left anterior chest where latissimus is now located.  POSTURE: Forward head, rounded shoulders  HAND DOMINANCE: Right  UPPER EXTREMITY AROM/PROM:  A/PROM RIGHT   eval   Shoulder extension 54  Shoulder flexion 151  Shoulder abduction 148  Shoulder internal rotation 79  Shoulder external rotation 84    (Blank rows = not tested)  A/PROM LEFT   eval  Shoulder extension 39  Shoulder flexion 124  Shoulder abduction 140  Shoulder internal rotation 85  Shoulder external rotation 75    (Blank rows = not tested)   CERVICAL AROM:  Flexion WNL  Extension WNL  Right lateral flexion WNL  Left lateral flexion WNL  Right rotation WNL  Left rotation WNL    UPPER EXTREMITY STRENGTH: Not tested   LYMPHEDEMA ASSESSMENTS:   SURGERY TYPE/DATE: Bilateral mastectomy 05/12/2021 with left sentinel node biopsy; 4 additional surgeries over past year due to reconstruction complications  NUMBER OF LYMPH NODES REMOVED: 3  CHEMOTHERAPY: no   RADIATION:no  HORMONE TREATMENT: stopped Tamoxifen due to headaches  INFECTIONS: Cellulitis left breast  LYMPHEDEMA ASSESSMENTS:   LANDMARK RIGHT eval  10 cm proximal to olecranon process 32  Olecranon process 27  10 cm proximal to ulnar styloid  process 22.4  Just proximal to ulnar styloid process 16.4  Across hand at thumb web space 20.3  At base of 2nd digit 6.5  (Blank rows = not tested)  LANDMARK LEFT  eval  10 cm proximal to olecranon process 32.2  Olecranon process 26.2  10 cm proximal to ulnar styloid process 22  Just proximal to ulnar styloid process 16.4  Across hand at thumb web space 19.3  At base of 2nd digit 6.2  (Blank rows = not tested)  PATIENT SURVEYS:   Katina Dung - 01/23/22 0001     Open a tight or new jar Mild difficulty    Do heavy household chores (wash walls, wash floors) Mild difficulty    Carry a shopping bag or briefcase No difficulty    Wash your back Mild difficulty    Use a knife to cut food Mild difficulty    Recreational activities in which you take some force or impact through your arm, shoulder, or hand (golf, hammering, tennis) Mild difficulty    During the past week, to what extent has your arm, shoulder or hand problem interfered with your normal social activities with family, friends, neighbors, or groups? Slightly    During the past week, to what extent has your arm, shoulder or hand problem limited your work or other regular daily activities Slightly    Arm, shoulder, or hand pain. Moderate    Tingling (pins and needles) in your arm, shoulder, or hand Moderate    Difficulty Sleeping Mild difficulty    DASH Score 27.27 %             HOME EXERCISE PROGRAM: None issued today  ASSESSMENT:  CLINICAL IMPRESSION: Patient is a 41 y.o. female who was seen today for physical therapy evaluation and treatment for left breast cancer surgery complications.    OBJECTIVE IMPAIRMENTS: decreased ROM, decreased strength, increased fascial restrictions, impaired flexibility, impaired UE functional use, postural dysfunction, and pain.   ACTIVITY LIMITATIONS: carrying, lifting, and reach over head  PARTICIPATION LIMITATIONS:  cleaning  PERSONAL FACTORS: Time since onset of  injury/illness/exacerbation are also affecting patient's functional outcome.   REHAB POTENTIAL: Good  CLINICAL DECISION MAKING: Stable/uncomplicated  EVALUATION COMPLEXITY: Low  GOALS: Goals reviewed with patient? Yes  SHORT TERM GOALS: Target date: 02/20/2022  Patient will report >/= 25% reduction in pain in left chest to tolerate daily tasks with greater ease. Goal status: INITIAL  2.  Patient will demonstrate proper technique with her HEP. Goal status: INITIAL  3.  Patient will regain shoulder flexion and abduction to >/= 150 degrees for increased ease reaching. Goal status: INITIAL  4.  Patient will improve her DASH score to </= 20 for improved overall UE function. Goal status: INITIAL  LONG TERM GOALS: Target date: 03/20/2022  Patient will report >/= 25% reduction in pain in left chest to tolerate daily tasks with greater ease. Goal status: INITIAL  2.  Patient will verbalize good understanding of a safe self-progression of her HEP Goal status: INITIAL  3.  Patient will increase left shoulder flexion and abduction to >/= 155 degrees for increased ease reaching overhead. Goal status: INITIAL  4.  Patient will improve her DASH score to </= 14 for improved overall UE function. Goal status: INITIAL  PLAN:  PT FREQUENCY: 1x/week  PT DURATION: 8 weeks  PLANNED INTERVENTIONS: Therapeutic exercises, Therapeutic activity, Patient/Family education, Self Care, Joint mobilization, Dry Needling, Manual lymph drainage, scar mobilization, Manual therapy, and Re-evaluation  PLAN FOR NEXT SESSION: Begin left shoulder PROM and AROM exercises; dry needling left subscapularis and upper trap   Haley Evans, PT 01/23/22 1:18 PM

## 2022-01-23 ENCOUNTER — Ambulatory Visit: Payer: BC Managed Care – PPO | Attending: Plastic Surgery | Admitting: Physical Therapy

## 2022-01-23 ENCOUNTER — Encounter: Payer: Self-pay | Admitting: Physical Therapy

## 2022-01-23 ENCOUNTER — Ambulatory Visit: Payer: BC Managed Care – PPO | Admitting: Physical Therapy

## 2022-01-23 ENCOUNTER — Other Ambulatory Visit: Payer: Self-pay

## 2022-01-23 DIAGNOSIS — R293 Abnormal posture: Secondary | ICD-10-CM | POA: Insufficient documentation

## 2022-01-23 DIAGNOSIS — R252 Cramp and spasm: Secondary | ICD-10-CM | POA: Insufficient documentation

## 2022-01-23 DIAGNOSIS — C50412 Malignant neoplasm of upper-outer quadrant of left female breast: Secondary | ICD-10-CM | POA: Diagnosis present

## 2022-01-23 DIAGNOSIS — Z483 Aftercare following surgery for neoplasm: Secondary | ICD-10-CM | POA: Insufficient documentation

## 2022-01-23 DIAGNOSIS — M25612 Stiffness of left shoulder, not elsewhere classified: Secondary | ICD-10-CM | POA: Insufficient documentation

## 2022-01-23 DIAGNOSIS — Z170 Estrogen receptor positive status [ER+]: Secondary | ICD-10-CM | POA: Diagnosis present

## 2022-01-26 ENCOUNTER — Ambulatory Visit: Payer: BC Managed Care – PPO | Admitting: Physical Therapy

## 2022-01-26 ENCOUNTER — Encounter: Payer: Self-pay | Admitting: Physical Medicine & Rehabilitation

## 2022-02-01 ENCOUNTER — Ambulatory Visit: Payer: BC Managed Care – PPO | Attending: Plastic Surgery

## 2022-02-01 DIAGNOSIS — R293 Abnormal posture: Secondary | ICD-10-CM | POA: Diagnosis present

## 2022-02-01 DIAGNOSIS — M25612 Stiffness of left shoulder, not elsewhere classified: Secondary | ICD-10-CM | POA: Insufficient documentation

## 2022-02-01 DIAGNOSIS — R252 Cramp and spasm: Secondary | ICD-10-CM | POA: Diagnosis present

## 2022-02-01 DIAGNOSIS — Z483 Aftercare following surgery for neoplasm: Secondary | ICD-10-CM | POA: Insufficient documentation

## 2022-02-01 NOTE — Therapy (Signed)
OUTPATIENT PT UPPER EXTREMITY TREATMENT  Patient Name: Haley Evans MRN: [id number] DOB:01-15-1981, 41 y.o., female Today's Date: 02/01/2022  END OF SESSION:  PT End of Session - 02/01/22 1318     Visit Number 2    Date for PT Re-Evaluation 03/20/22    Authorization Type [employer]    PT Start Time 1232    PT Stop Time 1317    PT Time Calculation (min) 45 min    Activity Tolerance Patient tolerated treatment well    Behavior During Therapy WFL for tasks assessed/performed              Past Medical History:  Diagnosis Date   Anemia    Anxiety    Bipolar 2 disorder ([place])    Cancer ([place])    breast   Depression    Family history of adverse reaction to anesthesia    aunt woke up during hip replacement   Seizure ([place])    vs syncopy, age 68 and 12/2017, Seen by neurologist, a definitive diagnosis was not made. Pt reports having seizure aroudn 2021   Past Surgical History:  Procedure Laterality Date   ARM WOUND REPAIR / CLOSURE Right    fractured arm   BREAST IMPLANT REMOVAL Left 12/30/2021   Procedure: REMOVAL LEFT CHEST TISSUE EXPANDER;  Surgeon: Irene Limbo, MD;  Location: [place];  Service: Plastics;  Laterality: Left;   BREAST RECONSTRUCTION WITH PLACEMENT OF TISSUE EXPANDER AND FLEX HD (ACELLULAR HYDRATED DERMIS) Left 06/15/2021   Procedure: REPLACEMENT OF TISSUE EXPANDER AND FLEX HD (ACELLULAR HYDRATED DERMIS);  Surgeon: Wallace Going, DO;  Location: [place];  Service: Plastics;  Laterality: Left;   CESAREAN SECTION     DEBRIDEMENT AND CLOSURE WOUND Left 06/15/2021   Procedure: DEBRIDEMENT AND CLOSURE LEFT BREAST WOUND;  Surgeon: Wallace Going, DO;  Location: [place];  Service: Plastics;  Laterality: Left;  1 hour   LATISSIMUS FLAP TO BREAST Left 07/18/2021   Procedure: LATISSIMUS FLAP TO BREAST;  Surgeon: Wallace Going, DO;  Location: [place];  Service: Plastics;  Laterality: Left;   NIPPLE SPARING MASTECTOMY Right 05/12/2021    Procedure: RIGHT NIPPLE SPARING MASTECTOMY;  Surgeon: Jovita Kussmaul, MD;  Location: [place];  Service: General;  Laterality: Right;   NIPPLE SPARING MASTECTOMY WITH SENTINEL LYMPH NODE BIOPSY Left 05/12/2021   Procedure: LEFT NIPPLE SPARING MASTECTOMY WITH LEFT SENTINEL LYMPH NODE BIOPSY;  Surgeon: Jovita Kussmaul, MD;  Location: [place];  Service: General;  Laterality: Left;   PRE-PECTORAL BREAST RECON W/ PLACEMENT OF TISSUE EXPANDERS, DERMAL GRAFT Bilateral 05/12/2021   Procedure: IMMEDIATE BILATERAL BREAST RECONSTRUCTION WITH PLACEMENT OF TISSUE EXPANDERS AND FLEX HD;  Surgeon: Wallace Going, DO;  Location: [place];  Service: Plastics;  Laterality: Bilateral;   REMOVAL OF TISSUE EXPANDER Left 10/27/2021   Procedure: Left breast seroma drainage with tissue expander exchange;  Surgeon: Wallace Going, DO;  Location: [place];  Service: Plastics;  Laterality: Left;   TISSUE EXPANDER PLACEMENT Left 07/18/2021   Procedure: REMOVAL AND REPLACEMENT OF TISSUE EXPANDER;  Surgeon: Wallace Going, DO;  Location: [place];  Service: Plastics;  Laterality: Left;  3 hours   Patient Active Problem List   Diagnosis Date Noted   History of breast cancer 12/30/2021   Admission for removal of tissue expander of breast, unspecified laterality 07/18/2021   Acquired absence of left breast 07/18/2021   Open wound of left breast 07/15/2021  Breast cancer ([place]) 05/12/2021   Genetic testing 03/18/2021   Family history of pancreatic cancer 03/10/2021   Malignant neoplasm of upper-outer quadrant of left breast in female, estrogen receptor positive ([place]) 03/07/2021   Anxiety 06/25/2011    REFERRING PROVIDER: Irene Limbo  REFERRING DIAG: Left breast cancer  THERAPY DIAG:  Aftercare following surgery for neoplasm  Cramp and spasm  Abnormal posture  Stiffness of left shoulder, not elsewhere classified  ONSET DATE: 12/30/2021  Rationale for  Evaluation and Treatment: Rehabilitation  SUBJECTIVE                                                                                                                                                                                           SUBJECTIVE STATEMENT: I'm doing ok now. I have good days and bad days.   PERTINENT HISTORY: Bilateral nipple sparing mastectomy and left sentinel node biopsy (3 negative nodes) on 05/12/2021 with expanders placed for reconstruction. It is ER/PR positive and HER2 negative with a Ki67 of 1%. Muscle flap reconstruction on left performed 9/67/89 due to complications. 10/27/2021 Left tissue expander exchange with seroma drainage. 12/30/2021 - left tissue expander removed.  PAIN:  Are you having pain? Yes NPRS scale: 3/10 Pain location: Left scapula, bil neck  Pain orientation: Bilateral  PAIN TYPE: tingling and stinging Pain description: constant  Aggravating factors: Nothing really makes it worse; kind of constant Relieving factors: medication  PRECAUTIONS: Other: Left arm lymphedema risk  WEIGHT BEARING RESTRICTIONS: No  FALLS:  Has patient fallen in last 6 months? 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 Incisions appear to be healing well. No signs of infection.  OBSERVATIONS / OTHER ASSESSMENTS: Left scapula with tightness and poor mobility along her ribs posteriorly.  SENSATION: Hypersensitivity to left anterior chest where latissimus is now located.  POSTURE: Forward head,  rounded shoulders  HAND DOMINANCE: Right  UPPER EXTREMITY AROM/PROM:  A/PROM RIGHT   eval   Shoulder extension 54  Shoulder flexion 151  Shoulder abduction 148  Shoulder internal rotation 79  Shoulder external rotation 84    (Blank rows = not tested)  A/PROM LEFT   eval  Shoulder extension 39  Shoulder flexion 124  Shoulder abduction 140  Shoulder internal rotation 85  Shoulder external rotation 75    (Blank rows = not tested)   CERVICAL AROM:  Flexion WNL  Extension WNL  Right lateral flexion WNL  Left lateral flexion WNL  Right rotation WNL  Left rotation WNL    UPPER EXTREMITY STRENGTH: Not tested   LYMPHEDEMA ASSESSMENTS:   SURGERY TYPE/DATE: Bilateral mastectomy 05/12/2021 with left sentinel node biopsy; 4 additional surgeries over past year due to reconstruction complications  NUMBER OF LYMPH NODES REMOVED: 3  CHEMOTHERAPY: no   RADIATION:no  HORMONE TREATMENT: stopped Tamoxifen due to headaches  INFECTIONS: Cellulitis left breast  LYMPHEDEMA ASSESSMENTS:   LANDMARK RIGHT eval  10 cm proximal to olecranon process 32  Olecranon process 27  10 cm proximal to ulnar styloid process 22.4  Just proximal to ulnar styloid process 16.4  Across hand at thumb web space 20.3  At base of 2nd digit 6.5  (Blank rows = not tested)  LANDMARK LEFT  eval  10 cm proximal to olecranon process 32.2  Olecranon process 26.2  10 cm proximal to ulnar styloid process 22  Just proximal to ulnar styloid process 16.4  Across hand at thumb web space 19.3  At base of 2nd digit 6.2  (Blank rows = not tested) Date:  02/01/22 Trigger Point Dry-Needling  Treatment instructions: Expect mild to moderate muscle soreness. S/S of pneumothorax if dry needled over a lung field, and to seek immediate medical attention should they occur. Patient verbalized understanding of these instructions and education.  Patient Consent Given: Yes Education handout provided: Previously  provided Muscles treated: Lt subscapularis, rhomboids, bil upper traps, bil cervical multifidi. Treatment response/outcome: Utilized skilled palpation to identify trigger points.  During dry needling able to palpate muscle twitch and muscle elongation  Elongation and release to Lt scapular musculature and scap mobs.  Scar mobility and educated on how to do this at home.   Skilled palpation and monitoring by PT during dry needling    HOME EXERCISE PROGRAM: Access Code: [email or web address] URL: [email or web address] Date: 02/01/2022 Prepared by: Claiborne Billings  Exercises - Seated Cervical Flexion AROM  - 3 x daily - 7 x weekly - 1 sets - 3 reps - 20 hold - Seated Cervical Sidebending AROM  - 3 x daily - 7 x weekly - 1 sets - 3 reps - 20 hold - Seated Cervical Rotation AROM  - 3 x daily - 7 x weekly - 1 sets - 3 reps - 20 hold - Seated Correct Posture  - 1 x daily - 7 x weekly - 3 sets - 10 reps - Sidelying Open Book Thoracic Rotation with Knee on Foam Roll  - 2 x daily - 7 x weekly - 1 sets - 10 reps - Cat-Camel  - 2 x daily - 7 x weekly - 1 sets - 10 reps - 5 hold - Child's Pose Stretch  - 1 x daily - 7 x weekly - 3 sets - 10 reps - Child's Pose with Sidebending  - 1 x daily - 7 x weekly - 3 sets - 10 reps - Child's Pose with Thread the Needle  - 1 x daily - 7 x weekly - 1 sets - 10 reps  ASSESSMENT:  CLINICAL IMPRESSION: Pt had good response to DN today with twitch and and improved mobility of tissue, scar and scapula after manual therapy.  PT educated pt on cervical and thoracic mobility exercises and she will work on these at home.  Pt  with less sensitivity to touch over distal breast incision and continues to be sensitive over lateral lat flap muscle.  Patient will benefit from skilled PT to address the below impairments and improve overall function.   OBJECTIVE IMPAIRMENTS: decreased ROM, decreased strength, increased fascial restrictions, impaired flexibility, impaired UE functional use,  postural dysfunction, and pain.   ACTIVITY LIMITATIONS: carrying, lifting, and reach over head  PARTICIPATION LIMITATIONS: cleaning  PERSONAL FACTORS: Time since onset of injury/illness/exacerbation are also affecting patient's functional outcome.   REHAB POTENTIAL: Good  CLINICAL DECISION MAKING: Stable/uncomplicated  EVALUATION COMPLEXITY: Low  GOALS: Goals reviewed with patient? Yes  SHORT TERM GOALS: Target date: 02/20/2022  Patient will report >/= 25% reduction in pain in left chest to tolerate daily tasks with greater ease. Goal status: INITIAL  2.  Patient will demonstrate proper technique with her HEP. Goal status: INITIAL  3.  Patient will regain shoulder flexion and abduction to >/= 150 degrees for increased ease reaching. Goal status: INITIAL  4.  Patient will improve her DASH score to </= 20 for improved overall UE function. Goal status: INITIAL  LONG TERM GOALS: Target date: 03/20/2022  Patient will report >/= 25% reduction in pain in left chest to tolerate daily tasks with greater ease. Goal status: INITIAL  2.  Patient will verbalize good understanding of a safe self-progression of her HEP Goal status: INITIAL  3.  Patient will increase left shoulder flexion and abduction to >/= 155 degrees for increased ease reaching overhead. Goal status: INITIAL  4.  Patient will improve her DASH score to </= 14 for improved overall UE function. Goal status: INITIAL  PLAN:  PT FREQUENCY: 1x/week  PT DURATION: 8 weeks  PLANNED INTERVENTIONS: Therapeutic exercises, Therapeutic activity, Patient/Family education, Self Care, Joint mobilization, Dry Needling, Manual lymph drainage, scar mobilization, Manual therapy, and Re-evaluation  PLAN FOR NEXT SESSION: Mobilize Lt lat flap, incision, DN to scapula and neck.  Review thoracic mobility   Sigurd Sos, PT 02/01/22 1:23 PM

## 2022-02-02 ENCOUNTER — Ambulatory Visit: Payer: BC Managed Care – PPO | Admitting: Physical Medicine & Rehabilitation

## 2022-02-07 ENCOUNTER — Encounter
Payer: BC Managed Care – PPO | Attending: Physical Medicine & Rehabilitation | Admitting: Physical Medicine & Rehabilitation

## 2022-02-07 ENCOUNTER — Encounter: Payer: Self-pay | Admitting: Physical Medicine & Rehabilitation

## 2022-02-07 VITALS — BP 116/80 | HR 73 | Ht 70.0 in | Wt 199.4 lb

## 2022-02-07 DIAGNOSIS — G8918 Other acute postprocedural pain: Secondary | ICD-10-CM | POA: Diagnosis present

## 2022-02-07 DIAGNOSIS — F319 Bipolar disorder, unspecified: Secondary | ICD-10-CM | POA: Insufficient documentation

## 2022-02-07 DIAGNOSIS — M792 Neuralgia and neuritis, unspecified: Secondary | ICD-10-CM | POA: Insufficient documentation

## 2022-02-07 NOTE — Progress Notes (Signed)
Subjective:    Patient ID: Haley Evans, female    DOB: [birth year], 41 y.o.   MRN: [id number]  HPI  HPI Haley Evans is a 41 year old female with past medical history of bipolar disorder, breast cancer here for chronic pain.  Patient had mastectomy 05/12/2021.  She then had left breast reconstruction with placement of tissue expander 06/15/2021.  On 07/18/2021 she had another surgery after having poor wound healing and had left breast reconstruction with latissimus flap and tissue expander by Dr. Marla Roe.  Patient reports that she has had worsening pain since her surgeries.  Pain has has most severe after her third surgery. She has had worsening pain over her left chest back and shoulder.  She has burning and tingling pain over her back at the site of the latissimus flap.  She has dull aching pain around her scapula.  She also has aching in shooting pain around her chest.  She has tried Voltaren gel and Flexeril without much benefit.  She is working with physical therapy and completing dry needling with improvement to her pain.  She has also tried gabapentin 100 mg 3 times daily but reports it makes her feel foggy.  It does provide some benefit to the pain. She also did not take the medication regularly.  She also tried Tylenol and Advil with mild to moderate improvement in her pain when she takes them. She has a history of bipolar disorder. She reports she is not on any medications at this time although she feels her bipolar disorder has been stable recently.       Visit 11/21/2021 Haley Evans is here for follow-up regarding her chronic postmastectomy pain. She had had removal and replacement of L breast tissue expander 10/27/21 by Dr. Marla Roe.  She reports that her pain has significantly improved since this time.  She no longer has the burning and tingling pain over her chest back and shoulder.  She still has some aching pain in her left lateral back.  She is taking turmeric with benefit.   She also sometimes takes Tylenol (less than 3g daily) or ibuprofen.  She was restarted gabapentin as patient reports she did not tolerate Lyrica very well and felt like it was affecting her mood.  She does not take the gabapentin regularly and will take 100 to 300 mg with each dose.  She is not having significant sedation with this medication.  She reports that her prior concerns of sedation with gabapentin were possibly related to other medications she was taking at the time.  Patient reports that plastic surgery has reordered her physical therapy and she plans to start this soon.  Interval history today Haley Evans is here for follow-up of her chronic mastectomy related pain.  On 11 3 she had a repeat surgery with removal of left chest tissue expander.  Pain was initially worsened after this procedure and she required a few tablets of oxycodone for acute pain control.  She still has 3 tablets left.  She reports she may have an additional surgery in about 3 months.  She is back in physical therapy and seeing them once a week.  They are working on dry needling and desensitization.  She reports her surgical incisions are healed.  She continues to have flareups of pain that last a few days.  She uses Tylenol and ibuprofen to control this pain.  Gabapentin 200 mg 3 times a day helps to control the neuropathic pain component.  She will sometimes take 300  mg if the pain is worse.  The gabapentin does make her more hungry.  She also takes tizanidine which also helps her pain.  She would like to avoid any antidepressant medications. Overall she feels that her pain is fairly well-controlled and would not like to make any medication changes at this time.  Pain Inventory Average Pain 6 Pain Right Now 4 My pain is tingling and aching  In the last 24 hours, has pain interfered with the following? General activity 4 Relation with others 4 Enjoyment of life 4 What TIME of day is your pain at its worst? evening  and night Sleep (in general) Fair  Pain is worse with: walking, bending, sitting, inactivity, and standing Pain improves with: rest, therapy/exercise, and medication Relief from Meds: 9  Family History  Problem Relation Age of Onset   Bladder Cancer Maternal Grandmother 94   Pancreatic cancer Maternal Grandfather 63   Social History   Socioeconomic History   Marital status: Single    Spouse name: Not on file   Number of children: Not on file   Years of education: Not on file   Highest education level: Not on file  Occupational History   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  Tobacco Use   Smoking status: Former    Years: 20.00    Types: Cigarettes    Quit date: 04/12/2021    Years since quitting: 0.8   Smokeless tobacco: Never  Vaping Use   Vaping Use: Never used  Substance and Sexual Activity   Alcohol use: Not Currently    Alcohol/week: 5.0 standard drinks of alcohol    Types: 5 Standard drinks or equivalent per week   Drug use: No   Sexual activity: Not on file    Comment: [employer]pper 10 yr IUD  Other Topics Concern   Not on file  Social History Narrative   Not on file   Social Determinants of Health   Financial Resource Strain: Not on file  Food Insecurity: Not on file  Transportation Needs: Not on file  Physical Activity: Not on file  Stress: Not on file  Social Connections: Not on file   Past Surgical History:  Procedure Laterality Date   ARM WOUND REPAIR / CLOSURE Right    fractured arm   BREAST IMPLANT REMOVAL Left 12/30/2021   Procedure: REMOVAL LEFT CHEST TISSUE EXPANDER;  Surgeon: Irene Limbo, MD;  Location: [place];  Service: Plastics;  Laterality: Left;   BREAST RECONSTRUCTION WITH PLACEMENT OF TISSUE EXPANDER AND FLEX HD (ACELLULAR HYDRATED DERMIS) Left 06/15/2021   Procedure: REPLACEMENT OF TISSUE EXPANDER AND FLEX HD (ACELLULAR HYDRATED DERMIS);  Surgeon: Wallace Going, DO;  Location: [place];  Service:  Plastics;  Laterality: Left;   CESAREAN SECTION     DEBRIDEMENT AND CLOSURE WOUND Left 06/15/2021   Procedure: DEBRIDEMENT AND CLOSURE LEFT BREAST WOUND;  Surgeon: Wallace Going, DO;  Location: [place];  Service: Plastics;  Laterality: Left;  1 hour   LATISSIMUS FLAP TO BREAST Left 07/18/2021   Procedure: LATISSIMUS FLAP TO BREAST;  Surgeon: Wallace Going, DO;  Location: [place];  Service: Plastics;  Laterality: Left;   NIPPLE SPARING MASTECTOMY Right 05/12/2021   Procedure: RIGHT NIPPLE SPARING MASTECTOMY;  Surgeon: Jovita Kussmaul, MD;  Location: [place];  Service: General;  Laterality: Right;   NIPPLE SPARING MASTECTOMY WITH SENTINEL LYMPH NODE BIOPSY Left 05/12/2021   Procedure: LEFT NIPPLE SPARING MASTECTOMY WITH LEFT SENTINEL LYMPH  NODE BIOPSY;  Surgeon: Jovita Kussmaul, MD;  Location: [place];  Service: General;  Laterality: Left;   PRE-PECTORAL BREAST RECON W/ PLACEMENT OF TISSUE EXPANDERS, DERMAL GRAFT Bilateral 05/12/2021   Procedure: IMMEDIATE BILATERAL BREAST RECONSTRUCTION WITH PLACEMENT OF TISSUE EXPANDERS AND FLEX HD;  Surgeon: Wallace Going, DO;  Location: [place];  Service: Plastics;  Laterality: Bilateral;   REMOVAL OF TISSUE EXPANDER Left 10/27/2021   Procedure: Left breast seroma drainage with tissue expander exchange;  Surgeon: Wallace Going, DO;  Location: [place];  Service: Plastics;  Laterality: Left;   TISSUE EXPANDER PLACEMENT Left 07/18/2021   Procedure: REMOVAL AND REPLACEMENT OF TISSUE EXPANDER;  Surgeon: Wallace Going, DO;  Location: [place];  Service: Plastics;  Laterality: Left;  3 hours   Past Surgical History:  Procedure Laterality Date   ARM WOUND REPAIR / CLOSURE Right    fractured arm   BREAST IMPLANT REMOVAL Left 12/30/2021   Procedure: REMOVAL LEFT CHEST TISSUE EXPANDER;  Surgeon: Irene Limbo, MD;  Location: [place];  Service: Plastics;  Laterality: Left;    BREAST RECONSTRUCTION WITH PLACEMENT OF TISSUE EXPANDER AND FLEX HD (ACELLULAR HYDRATED DERMIS) Left 06/15/2021   Procedure: REPLACEMENT OF TISSUE EXPANDER AND FLEX HD (ACELLULAR HYDRATED DERMIS);  Surgeon: Wallace Going, DO;  Location: [place];  Service: Plastics;  Laterality: Left;   CESAREAN SECTION     DEBRIDEMENT AND CLOSURE WOUND Left 06/15/2021   Procedure: DEBRIDEMENT AND CLOSURE LEFT BREAST WOUND;  Surgeon: Wallace Going, DO;  Location: [place];  Service: Plastics;  Laterality: Left;  1 hour   LATISSIMUS FLAP TO BREAST Left 07/18/2021   Procedure: LATISSIMUS FLAP TO BREAST;  Surgeon: Wallace Going, DO;  Location: [place];  Service: Plastics;  Laterality: Left;   NIPPLE SPARING MASTECTOMY Right 05/12/2021   Procedure: RIGHT NIPPLE SPARING MASTECTOMY;  Surgeon: Jovita Kussmaul, MD;  Location: [place];  Service: General;  Laterality: Right;   NIPPLE SPARING MASTECTOMY WITH SENTINEL LYMPH NODE BIOPSY Left 05/12/2021   Procedure: LEFT NIPPLE SPARING MASTECTOMY WITH LEFT SENTINEL LYMPH NODE BIOPSY;  Surgeon: Jovita Kussmaul, MD;  Location: [place];  Service: General;  Laterality: Left;   PRE-PECTORAL BREAST RECON W/ PLACEMENT OF TISSUE EXPANDERS, DERMAL GRAFT Bilateral 05/12/2021   Procedure: IMMEDIATE BILATERAL BREAST RECONSTRUCTION WITH PLACEMENT OF TISSUE EXPANDERS AND FLEX HD;  Surgeon: Wallace Going, DO;  Location: [place];  Service: Plastics;  Laterality: Bilateral;   REMOVAL OF TISSUE EXPANDER Left 10/27/2021   Procedure: Left breast seroma drainage with tissue expander exchange;  Surgeon: Wallace Going, DO;  Location: [place];  Service: Plastics;  Laterality: Left;   TISSUE EXPANDER PLACEMENT Left 07/18/2021   Procedure: REMOVAL AND REPLACEMENT OF TISSUE EXPANDER;  Surgeon: Wallace Going, DO;  Location: [place];  Service: Plastics;  Laterality: Left;  3 hours   Past Medical History:  Diagnosis Date   Anemia     Anxiety    Bipolar 2 disorder ([place])    Cancer ([facility])    breast   Depression    Family history of adverse reaction to anesthesia    aunt woke up during hip replacement   Seizure ([place])    vs syncopy, age 69 and 12/2017, Seen by neurologist, a definitive diagnosis was not made. Pt reports having seizure aroudn 2021   BP 116/80   Pulse 73   Ht '5\' 10"'$  (1.778 m)  Wt 199 lb 6.4 oz (90.4 kg)   SpO2 99%   BMI 28.61 kg/m   Opioid Risk Score:   Fall Risk Score:  `1  Depression screen [facility] 2/9     02/07/2022   11:44 AM 11/21/2021    2:02 PM 09/29/2021    1:46 PM  Depression screen PHQ 2/9  Decreased Interest 0 0 1  Down, Depressed, Hopeless 0 0 0  PHQ - 2 Score 0 0 1  Altered sleeping   1  Tired, decreased energy   2  Change in appetite   2  Feeling bad or failure about yourself    0  Trouble concentrating   1  Moving slowly or fidgety/restless   0  Suicidal thoughts   0  PHQ-9 Score   7  Difficult doing work/chores   Somewhat difficult     Review of Systems  Constitutional: Negative.   HENT: Negative.    Eyes: Negative.   Respiratory: Negative.    Cardiovascular: Negative.   Gastrointestinal: Negative.   Endocrine: Negative.   Genitourinary: Negative.   Musculoskeletal:        Pain in thoracic area  Skin: Negative.   Allergic/Immunologic: Negative.   Neurological: Negative.   Hematological: Negative.   Psychiatric/Behavioral: Negative.    All other systems reviewed and are negative.      Objective:   Physical Exam  Gen: no distress, normal appearing HEENT: oral mucosa pink and moist, NCAT Cardio: Reg rate Chest: normal effort, normal rate of breathing Abd: soft, non-distended Ext: no edema Psych: pleasant, normal affect Skin: intact Neuro: Alert , , follows commands, cranial nerves II through XII grossly intact, sensation intact to light touch in all 4 extremities Strength 5 out of 5 in all 4 extremities Musculoskeletal: minimal tenderness noted over left  anterior chest, upper arm S/p latissimus flap surgery on the left, tenderness in over lateral left posterior back Active abduction of the left shoulder to about 90 degrees No tenderness noted around R shoulder or back      Assessment & Plan:   Post mastectomy and reconstruction pain, gradually improving.  She had removal of eft chest tissue expander on the left on 12/30/21. -Continue gabapentin to 200-'300mg'$  TID -Continue physical therapy -Continue tizanidine PRN Continue ibuprofen and tylenol PRN -Continue tumaric  -Currently pain fairly well controlled, continue current regimen, Tramadol may be option if additional medication is needed for pain control   Hx of bipolar disorder not on medications -She reports her mood has been stable.  -She would like to avoid use of antidepressant medications if possible at this time

## 2022-02-08 ENCOUNTER — Ambulatory Visit: Payer: BC Managed Care – PPO

## 2022-02-08 DIAGNOSIS — Z483 Aftercare following surgery for neoplasm: Secondary | ICD-10-CM | POA: Diagnosis not present

## 2022-02-08 DIAGNOSIS — R293 Abnormal posture: Secondary | ICD-10-CM

## 2022-02-08 DIAGNOSIS — R252 Cramp and spasm: Secondary | ICD-10-CM

## 2022-02-08 NOTE — Therapy (Signed)
OUTPATIENT PT UPPER EXTREMITY TREATMENT  Patient Name: Haley Evans MRN: [id number] DOB:1980-06-03, 41 y.o., female Today's Date: 02/08/2022  END OF SESSION:  PT End of Session - 02/08/22 1138     Visit Number 3    Date for PT Re-Evaluation 03/20/22    Authorization Type [employer]    PT Start Time 1100    PT Stop Time 1143    PT Time Calculation (min) 43 min    Activity Tolerance Patient tolerated treatment well    Behavior During Therapy WFL for tasks assessed/performed               Past Medical History:  Diagnosis Date   Anemia    Anxiety    Bipolar 2 disorder ([place])    Cancer ([place])    breast   Depression    Family history of adverse reaction to anesthesia    aunt woke up during hip replacement   Seizure ([place])    vs syncopy, age 61 and 12/2017, Seen by neurologist, a definitive diagnosis was not made. Pt reports having seizure aroudn 2021   Past Surgical History:  Procedure Laterality Date   ARM WOUND REPAIR / CLOSURE Right    fractured arm   BREAST IMPLANT REMOVAL Left 12/30/2021   Procedure: REMOVAL LEFT CHEST TISSUE EXPANDER;  Surgeon: Irene Limbo, MD;  Location: [place];  Service: Plastics;  Laterality: Left;   BREAST RECONSTRUCTION WITH PLACEMENT OF TISSUE EXPANDER AND FLEX HD (ACELLULAR HYDRATED DERMIS) Left 06/15/2021   Procedure: REPLACEMENT OF TISSUE EXPANDER AND FLEX HD (ACELLULAR HYDRATED DERMIS);  Surgeon: Wallace Going, DO;  Location: [place];  Service: Plastics;  Laterality: Left;   CESAREAN SECTION     DEBRIDEMENT AND CLOSURE WOUND Left 06/15/2021   Procedure: DEBRIDEMENT AND CLOSURE LEFT BREAST WOUND;  Surgeon: Wallace Going, DO;  Location: [place];  Service: Plastics;  Laterality: Left;  1 hour   LATISSIMUS FLAP TO BREAST Left 07/18/2021   Procedure: LATISSIMUS FLAP TO BREAST;  Surgeon: Wallace Going, DO;  Location: [place];  Service: Plastics;  Laterality: Left;   NIPPLE SPARING MASTECTOMY Right 05/12/2021    Procedure: RIGHT NIPPLE SPARING MASTECTOMY;  Surgeon: Jovita Kussmaul, MD;  Location: [place];  Service: General;  Laterality: Right;   NIPPLE SPARING MASTECTOMY WITH SENTINEL LYMPH NODE BIOPSY Left 05/12/2021   Procedure: LEFT NIPPLE SPARING MASTECTOMY WITH LEFT SENTINEL LYMPH NODE BIOPSY;  Surgeon: Jovita Kussmaul, MD;  Location: [place];  Service: General;  Laterality: Left;   PRE-PECTORAL BREAST RECON W/ PLACEMENT OF TISSUE EXPANDERS, DERMAL GRAFT Bilateral 05/12/2021   Procedure: IMMEDIATE BILATERAL BREAST RECONSTRUCTION WITH PLACEMENT OF TISSUE EXPANDERS AND FLEX HD;  Surgeon: Wallace Going, DO;  Location: [place];  Service: Plastics;  Laterality: Bilateral;   REMOVAL OF TISSUE EXPANDER Left 10/27/2021   Procedure: Left breast seroma drainage with tissue expander exchange;  Surgeon: Wallace Going, DO;  Location: [place];  Service: Plastics;  Laterality: Left;   TISSUE EXPANDER PLACEMENT Left 07/18/2021   Procedure: REMOVAL AND REPLACEMENT OF TISSUE EXPANDER;  Surgeon: Wallace Going, DO;  Location: [place];  Service: Plastics;  Laterality: Left;  3 hours   Patient Active Problem List   Diagnosis Date Noted   History of breast cancer 12/30/2021   Admission for removal of tissue expander of breast, unspecified laterality 07/18/2021   Acquired absence of left breast 07/18/2021   Open wound of left breast 07/15/2021  Breast cancer ([place]) 05/12/2021   Genetic testing 03/18/2021   Family history of pancreatic cancer 03/10/2021   Malignant neoplasm of upper-outer quadrant of left breast in female, estrogen receptor positive ([place]) 03/07/2021   Anxiety 06/25/2011    REFERRING PROVIDER: Irene Limbo  REFERRING DIAG: Left breast cancer  THERAPY DIAG:  Aftercare following surgery for neoplasm  Cramp and spasm  Abnormal posture  ONSET DATE: 12/30/2021  Rationale for Evaluation and Treatment: Rehabilitation  SUBJECTIVE                                                                                                                                                                                            SUBJECTIVE STATEMENT: I am getting better.  Lt shoulder blade feels 75% better.  I'm doing more stretching.  I am working to mobilize my incision.  PERTINENT HISTORY: Bilateral nipple sparing mastectomy and left sentinel node biopsy (3 negative nodes) on 05/12/2021 with expanders placed for reconstruction. It is ER/PR positive and HER2 negative with a Ki67 of 1%. Muscle flap reconstruction on left performed 4/43/15 due to complications. 10/27/2021 Left tissue expander exchange with seroma drainage. 12/30/2021 - left tissue expander removed.  PAIN:  Are you having pain? Yes NPRS scale: 0/10 Pain location: Left scapula, bil neck  Pain orientation: Bilateral  PAIN TYPE: tingling and stinging Pain description: constant  Aggravating factors: Nothing really makes it worse; kind of constant Relieving factors: medication  PRECAUTIONS: Other: Left arm lymphedema risk  WEIGHT BEARING RESTRICTIONS: No  FALLS:  Has patient fallen in last 6 months? 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 Incisions appear to be healing well. No signs of infection.  OBSERVATIONS / OTHER ASSESSMENTS: Left scapula with tightness and poor mobility along her ribs posteriorly.  SENSATION: Hypersensitivity to left anterior chest where latissimus is now located.  POSTURE:  Forward head, rounded shoulders  HAND DOMINANCE: Right  UPPER EXTREMITY AROM/PROM:  A/PROM RIGHT   eval   Shoulder extension 54  Shoulder flexion 151  Shoulder abduction 148  Shoulder internal rotation 79  Shoulder external rotation 84    (Blank rows = not tested)  A/PROM LEFT   eval  Shoulder extension 39  Shoulder flexion 124  Shoulder abduction 140  Shoulder internal rotation 85  Shoulder external rotation 75    (Blank rows = not tested)   CERVICAL AROM:  Flexion WNL  Extension WNL  Right lateral flexion WNL  Left lateral flexion WNL  Right rotation WNL  Left rotation WNL    UPPER EXTREMITY STRENGTH: Not tested   LYMPHEDEMA ASSESSMENTS:   SURGERY TYPE/DATE: Bilateral mastectomy 05/12/2021 with left sentinel node biopsy; 4 additional surgeries over past year due to reconstruction complications  NUMBER OF LYMPH NODES REMOVED: 3  CHEMOTHERAPY: no   RADIATION:no  HORMONE TREATMENT: stopped Tamoxifen due to headaches  INFECTIONS: Cellulitis left breast  LYMPHEDEMA ASSESSMENTS:   LANDMARK RIGHT eval  10 cm proximal to olecranon process 32  Olecranon process 27  10 cm proximal to ulnar styloid process 22.4  Just proximal to ulnar styloid process 16.4  Across hand at thumb web space 20.3  At base of 2nd digit 6.5  (Blank rows = not tested)  LANDMARK LEFT  eval  10 cm proximal to olecranon process 32.2  Olecranon process 26.2  10 cm proximal to ulnar styloid process 22  Just proximal to ulnar styloid process 16.4  Across hand at thumb web space 19.3  At base of 2nd digit 6.2  (Blank rows = not tested)  TREATMENT:  Date:  02/08/22 Trigger Point Dry-Needling  Treatment instructions: Expect mild to moderate muscle soreness. S/S of pneumothorax if dry needled over a lung field, and to seek immediate medical attention should they occur. Patient verbalized understanding of these instructions and education.  Patient Consent Given: Yes Education  handout provided: Previously provided Muscles treated: Lt subscapularis, rhomboids, bil upper traps, bil cervical multifidi. Treatment response/outcome: Utilized skilled palpation to identify trigger points.  During dry needling able to palpate muscle twitch and muscle elongation  Elongation and release to Lt scapular musculature and scap mobs.   Skilled palpation and monitoring by PT during dry needling   Date:  02/01/22 Trigger Point Dry-Needling  Treatment instructions: Expect mild to moderate muscle soreness. S/S of pneumothorax if dry needled over a lung field, and to seek immediate medical attention should they occur. Patient verbalized understanding of these instructions and education.  Patient Consent Given: Yes Education handout provided: Previously provided Muscles treated: Lt subscapularis, rhomboids, bil upper traps, bil cervical multifidi. Treatment response/outcome: Utilized skilled palpation to identify trigger points.  During dry needling able to palpate muscle twitch and muscle elongation  Elongation and release to Lt scapular musculature and scap mobs.  Scar mobility and educated on how to do this at home.   Skilled palpation and monitoring by PT during dry needling    HOME EXERCISE PROGRAM: Access Code: [email or web address] URL: [email or web address] Date: 02/01/2022 Prepared by: Claiborne Billings  Exercises - Seated Cervical Flexion AROM  - 3 x daily - 7 x weekly - 1 sets - 3 reps - 20 hold - Seated Cervical Sidebending AROM  - 3 x daily - 7 x weekly - 1 sets - 3 reps - 20 hold - Seated Cervical Rotation AROM  - 3 x daily - 7 x weekly - 1 sets - 3 reps - 20 hold - Seated Correct Posture  - 1 x daily - 7 x weekly - 3 sets - 10 reps - Sidelying Open Book Thoracic Rotation with Knee on Foam Roll  - 2 x daily - 7 x weekly - 1 sets - 10 reps - Cat-Camel  - 2 x daily - 7 x weekly -  1 sets - 10 reps - 5 hold - Child's Pose Stretch  - 1 x daily - 7 x weekly - 3 sets - 10 reps -  Child's Pose with Sidebending  - 1 x daily - 7 x weekly - 3 sets - 10 reps - Child's Pose with Thread the Needle  - 1 x daily - 7 x weekly - 1 sets - 10 reps  ASSESSMENT:  CLINICAL IMPRESSION: Pt reports 75% overall improvement in symptoms since starting PT.  She arrived today without pain and reports that she is doing her stretches and thoracic mobility. Pt demonstrated improved scapular mobility with Lt shoulder IR and PT.  Pt with good twitch response in Lt lats and rhomboids today and had improved tissue mobility after manual therapy and DN today.   Patient will benefit from skilled PT to address the below impairments and improve overall function.   OBJECTIVE IMPAIRMENTS: decreased ROM, decreased strength, increased fascial restrictions, impaired flexibility, impaired UE functional use, postural dysfunction, and pain.   ACTIVITY LIMITATIONS: carrying, lifting, and reach over head  PARTICIPATION LIMITATIONS: cleaning  PERSONAL FACTORS: Time since onset of injury/illness/exacerbation are also affecting patient's functional outcome.   REHAB POTENTIAL: Good  CLINICAL DECISION MAKING: Stable/uncomplicated  EVALUATION COMPLEXITY: Low  GOALS: Goals reviewed with patient? Yes  SHORT TERM GOALS: Target date: 02/20/2022  Patient will report >/= 25% reduction in pain in left chest to tolerate daily tasks with greater ease. Goal status: INITIAL  2.  Patient will demonstrate proper technique with her HEP. Goal status: INITIAL  3.  Patient will regain shoulder flexion and abduction to >/= 150 degrees for increased ease reaching. Goal status: INITIAL  4.  Patient will improve her DASH score to </= 20 for improved overall UE function. Goal status: INITIAL  LONG TERM GOALS: Target date: 03/20/2022  Patient will report >/= 25% reduction in pain in left chest to tolerate daily tasks with greater ease. Goal status: INITIAL  2.  Patient will verbalize good understanding of a safe  self-progression of her HEP Goal status: INITIAL  3.  Patient will increase left shoulder flexion and abduction to >/= 155 degrees for increased ease reaching overhead. Goal status: INITIAL  4.  Patient will improve her DASH score to </= 14 for improved overall UE function. Goal status: INITIAL  PLAN:  PT FREQUENCY: 1x/week  PT DURATION: 8 weeks  PLANNED INTERVENTIONS: Therapeutic exercises, Therapeutic activity, Patient/Family education, Self Care, Joint mobilization, Dry Needling, Manual lymph drainage, scar mobilization, Manual therapy, and Re-evaluation  PLAN FOR NEXT SESSION: Mobilize Lt lat flap, incision, DN to scapula and neck.  Review thoracic mobility as needed.     Sigurd Sos, PT 02/08/22 11:55 AM

## 2022-02-15 ENCOUNTER — Ambulatory Visit: Payer: BC Managed Care – PPO

## 2022-02-15 DIAGNOSIS — Z483 Aftercare following surgery for neoplasm: Secondary | ICD-10-CM | POA: Diagnosis not present

## 2022-02-15 DIAGNOSIS — M25612 Stiffness of left shoulder, not elsewhere classified: Secondary | ICD-10-CM

## 2022-02-15 DIAGNOSIS — R252 Cramp and spasm: Secondary | ICD-10-CM

## 2022-02-15 NOTE — Therapy (Signed)
OUTPATIENT PT UPPER EXTREMITY TREATMENT  Patient Name: Haley Evans MRN: [id number] DOB:1980/11/23, 41 y.o., female Today's Date: 02/15/2022  END OF SESSION:  PT End of Session - 02/15/22 1142     Visit Number 4    Date for PT Re-Evaluation 03/20/22    Authorization Type [employer]    PT Start Time 1102    PT Stop Time 1140    PT Time Calculation (min) 38 min    Activity Tolerance Patient tolerated treatment well    Behavior During Therapy WFL for tasks assessed/performed                Past Medical History:  Diagnosis Date   Anemia    Anxiety    Bipolar 2 disorder ([place])    Cancer ([place])    breast   Depression    Family history of adverse reaction to anesthesia    aunt woke up during hip replacement   Seizure ([place])    vs syncopy, age 66 and 12/2017, Seen by neurologist, a definitive diagnosis was not made. Pt reports having seizure aroudn 2021   Past Surgical History:  Procedure Laterality Date   ARM WOUND REPAIR / CLOSURE Right    fractured arm   BREAST IMPLANT REMOVAL Left 12/30/2021   Procedure: REMOVAL LEFT CHEST TISSUE EXPANDER;  Surgeon: Irene Limbo, MD;  Location: [facility];  Service: Plastics;  Laterality: Left;   BREAST RECONSTRUCTION WITH PLACEMENT OF TISSUE EXPANDER AND FLEX HD (ACELLULAR HYDRATED DERMIS) Left 06/15/2021   Procedure: REPLACEMENT OF TISSUE EXPANDER AND FLEX HD (ACELLULAR HYDRATED DERMIS);  Surgeon: Wallace Going, DO;  Location: [facility];  Service: Plastics;  Laterality: Left;   CESAREAN SECTION     DEBRIDEMENT AND CLOSURE WOUND Left 06/15/2021   Procedure: DEBRIDEMENT AND CLOSURE LEFT BREAST WOUND;  Surgeon: Wallace Going, DO;  Location: [facility];  Service: Plastics;  Laterality: Left;  1 hour   LATISSIMUS FLAP TO BREAST Left 07/18/2021   Procedure: LATISSIMUS FLAP TO BREAST;  Surgeon: Wallace Going, DO;  Location: [facility];  Service: Plastics;  Laterality: Left;   NIPPLE SPARING MASTECTOMY Right 05/12/2021    Procedure: RIGHT NIPPLE SPARING MASTECTOMY;  Surgeon: Jovita Kussmaul, MD;  Location: [facility];  Service: General;  Laterality: Right;   NIPPLE SPARING MASTECTOMY WITH SENTINEL LYMPH NODE BIOPSY Left 05/12/2021   Procedure: LEFT NIPPLE SPARING MASTECTOMY WITH LEFT SENTINEL LYMPH NODE BIOPSY;  Surgeon: Jovita Kussmaul, MD;  Location: [facility];  Service: General;  Laterality: Left;   PRE-PECTORAL BREAST RECON W/ PLACEMENT OF TISSUE EXPANDERS, DERMAL GRAFT Bilateral 05/12/2021   Procedure: IMMEDIATE BILATERAL BREAST RECONSTRUCTION WITH PLACEMENT OF TISSUE EXPANDERS AND FLEX HD;  Surgeon: Wallace Going, DO;  Location: [facility];  Service: Plastics;  Laterality: Bilateral;   REMOVAL OF TISSUE EXPANDER Left 10/27/2021   Procedure: Left breast seroma drainage with tissue expander exchange;  Surgeon: Wallace Going, DO;  Location: [facility];  Service: Plastics;  Laterality: Left;   TISSUE EXPANDER PLACEMENT Left 07/18/2021   Procedure: REMOVAL AND REPLACEMENT OF TISSUE EXPANDER;  Surgeon: Wallace Going, DO;  Location: [facility];  Service: Plastics;  Laterality: Left;  3 hours   Patient Active Problem List   Diagnosis Date Noted   History of breast cancer 12/30/2021   Admission for removal of tissue expander of breast, unspecified laterality 07/18/2021   Acquired absence of left breast 07/18/2021   Open wound of left breast  07/15/2021   Breast cancer ([place]) 05/12/2021   Genetic testing 03/18/2021   Family history of pancreatic cancer 03/10/2021   Malignant neoplasm of upper-outer quadrant of left breast in female, estrogen receptor positive ([place]) 03/07/2021   Anxiety 06/25/2011    REFERRING PROVIDER: Irene Limbo  REFERRING DIAG: Left breast cancer  THERAPY DIAG:  Aftercare following surgery for neoplasm  Cramp and spasm  Stiffness of left shoulder, not elsewhere classified  ONSET DATE: 12/30/2021  Rationale for Evaluation and  Treatment: Rehabilitation  SUBJECTIVE                                                                                                                                                                                           SUBJECTIVE STATEMENT: I have been walking 2x/day and exercising.  My shoulder blade is moving much better.  Most of my discomfort in on the lateral ribs on Lt.   PERTINENT HISTORY: Bilateral nipple sparing mastectomy and left sentinel node biopsy (3 negative nodes) on 05/12/2021 with expanders placed for reconstruction. It is ER/PR positive and HER2 negative with a Ki67 of 1%. Muscle flap reconstruction on left performed 10/11/46 due to complications. 10/27/2021 Left tissue expander exchange with seroma drainage. 12/30/2021 - left tissue expander removed.  PAIN:  Are you having pain? Yes NPRS scale: 0/10 Pain location: Left scapula, bil neck  Pain orientation: Bilateral  PAIN TYPE: tingling and stinging Pain description: constant  Aggravating factors: Nothing really makes it worse; kind of constant Relieving factors: medication  PRECAUTIONS: Other: Left arm lymphedema risk  WEIGHT BEARING RESTRICTIONS: No  FALLS:  Has patient fallen in last 6 months? 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 Incisions appear to be healing well. No signs of infection.  OBSERVATIONS / OTHER ASSESSMENTS: Left scapula with tightness and poor mobility along her ribs posteriorly.  SENSATION: Hypersensitivity to left  anterior chest where latissimus is now located.  POSTURE: Forward head, rounded shoulders  HAND DOMINANCE: Right  UPPER EXTREMITY AROM/PROM:  A/PROM RIGHT   eval   Shoulder extension 54  Shoulder flexion 151  Shoulder abduction 148  Shoulder internal rotation 79  Shoulder external rotation 84    (Blank rows =  not tested)  A/PROM LEFT   eval  Shoulder extension 39  Shoulder flexion 124  Shoulder abduction 140  Shoulder internal rotation 85  Shoulder external rotation 75    (Blank rows = not tested)   CERVICAL AROM:  Flexion WNL  Extension WNL  Right lateral flexion WNL  Left lateral flexion WNL  Right rotation WNL  Left rotation WNL    UPPER EXTREMITY STRENGTH: Not tested   LYMPHEDEMA ASSESSMENTS:   SURGERY TYPE/DATE: Bilateral mastectomy 05/12/2021 with left sentinel node biopsy; 4 additional surgeries over past year due to reconstruction complications  NUMBER OF LYMPH NODES REMOVED: 3  CHEMOTHERAPY: no   RADIATION:no  HORMONE TREATMENT: stopped Tamoxifen due to headaches  INFECTIONS: Cellulitis left breast  LYMPHEDEMA ASSESSMENTS:   LANDMARK RIGHT eval  10 cm proximal to olecranon process 32  Olecranon process 27  10 cm proximal to ulnar styloid process 22.4  Just proximal to ulnar styloid process 16.4  Across hand at thumb web space 20.3  At base of 2nd digit 6.5  (Blank rows = not tested)  LANDMARK LEFT  eval  10 cm proximal to olecranon process 32.2  Olecranon process 26.2  10 cm proximal to ulnar styloid process 22  Just proximal to ulnar styloid process 16.4  Across hand at thumb web space 19.3  At base of 2nd digit 6.2  (Blank rows = not tested)  TREATMENT: Date:  02/15/22 Trigger Point Dry-Needling  Treatment instructions: Expect mild to moderate muscle soreness. S/S of pneumothorax if dry needled over a lung field, and to seek immediate medical attention should they occur. Patient verbalized understanding of these instructions  and education.  Patient Consent Given: Yes Education handout provided: Previously provided Muscles treated: Lt lats at ribs laterally Treatment response/outcome: Utilized skilled palpation to identify trigger points.  During dry needling able to palpate muscle twitch and muscle elongation  Elongation and release to Lt scapular musculature and scap mobs.   Scar mobs at distal Lt breast  Skilled palpation and monitoring by PT during dry needling  Date:  02/08/22 Trigger Point Dry-Needling  Treatment instructions: Expect mild to moderate muscle soreness. S/S of pneumothorax if dry needled over a lung field, and to seek immediate medical attention should they occur. Patient verbalized understanding of these instructions and education.  Patient Consent Given: Yes Education handout provided: Previously provided Muscles treated: Lt subscapularis, rhomboids, bil upper traps, bil cervical multifidi. Treatment response/outcome: Utilized skilled palpation to identify trigger points.  During dry needling able to palpate muscle twitch and muscle elongation  Elongation and release to Lt scapular musculature and scap mobs.   Skilled palpation and monitoring by PT during dry needling   Date:  02/01/22 Trigger Point Dry-Needling  Treatment instructions: Expect mild to moderate muscle soreness. S/S of pneumothorax if dry needled over a lung field, and to seek immediate medical attention should they occur. Patient verbalized understanding of these instructions and education.  Patient Consent Given: Yes Education handout provided: Previously provided Muscles treated: Lt subscapularis, rhomboids, bil upper traps, bil cervical multifidi. Treatment response/outcome: Utilized skilled palpation to identify trigger points.  During dry needling able to palpate muscle twitch and muscle elongation  Elongation and release to Lt scapular musculature and scap mobs.  Scar mobility and educated on how to do this at home.    Skilled palpation and monitoring by PT during dry needling    HOME EXERCISE PROGRAM: Access Code: [email or web address] URL: [email or web address] Date: 02/01/2022 Prepared by: Claiborne Billings  Exercises - Seated Cervical  Flexion AROM  - 3 x daily - 7 x weekly - 1 sets - 3 reps - 20 hold - Seated Cervical Sidebending AROM  - 3 x daily - 7 x weekly - 1 sets - 3 reps - 20 hold - Seated Cervical Rotation AROM  - 3 x daily - 7 x weekly - 1 sets - 3 reps - 20 hold - Seated Correct Posture  - 1 x daily - 7 x weekly - 3 sets - 10 reps - Sidelying Open Book Thoracic Rotation with Knee on Foam Roll  - 2 x daily - 7 x weekly - 1 sets - 10 reps - Cat-Camel  - 2 x daily - 7 x weekly - 1 sets - 10 reps - 5 hold - Child's Pose Stretch  - 1 x daily - 7 x weekly - 3 sets - 10 reps - Child's Pose with Sidebending  - 1 x daily - 7 x weekly - 3 sets - 10 reps - Child's Pose with Thread the Needle  - 1 x daily - 7 x weekly - 1 sets - 10 reps  ASSESSMENT:  CLINICAL IMPRESSION: Pt continues to report 75% overall improvement in symptoms since the start of care.  Her main area of discomfort is over Lt lateral ribs at lats and session focused on this with DN to this region followed by soft tissue mobilization and scar mobs over lat and distal Lt breast.  Pt with significant improvement in sensitivity over this region and was able to tolerate increased pressure with this today.  Pt has been walking 2x/day and keeping a journal of her activity.   Patient will benefit from skilled PT to address the below impairments and improve overall function.   OBJECTIVE IMPAIRMENTS: decreased ROM, decreased strength, increased fascial restrictions, impaired flexibility, impaired UE functional use, postural dysfunction, and pain.   ACTIVITY LIMITATIONS: carrying, lifting, and reach over head  PARTICIPATION LIMITATIONS: cleaning  PERSONAL FACTORS: Time since onset of injury/illness/exacerbation are also affecting patient's functional  outcome.   REHAB POTENTIAL: Good  CLINICAL DECISION MAKING: Stable/uncomplicated  EVALUATION COMPLEXITY: Low  GOALS: Goals reviewed with patient? Yes  SHORT TERM GOALS: Target date: 02/20/2022  Patient will report >/= 25% reduction in pain in left chest to tolerate daily tasks with greater ease. Goal status: MET  2.  Patient will demonstrate proper technique with her HEP. Goal status: INITIAL  3.  Patient will regain shoulder flexion and abduction to >/= 150 degrees for increased ease reaching. Goal status: INITIAL  4.  Patient will improve her DASH score to </= 20 for improved overall UE function. Goal status: INITIAL  LONG TERM GOALS: Target date: 03/20/2022  Patient will report >/= 25% reduction in pain in left chest to tolerate daily tasks with greater ease. Goal status: MET (pt reports 75%)  2.  Patient will verbalize good understanding of a safe self-progression of her HEP Goal status: INITIAL  3.  Patient will increase left shoulder flexion and abduction to >/= 155 degrees for increased ease reaching overhead. Goal status: INITIAL  4.  Patient will improve her DASH score to </= 14 for improved overall UE function. Goal status: INITIAL  PLAN:  PT FREQUENCY: 1x/week  PT DURATION: 8 weeks  PLANNED INTERVENTIONS: Therapeutic exercises, Therapeutic activity, Patient/Family education, Self Care, Joint mobilization, Dry Needling, Manual lymph drainage, scar mobilization, Manual therapy, and Re-evaluation  PLAN FOR NEXT SESSION: Mobilize Lt lat flap, incision, DN to scapula and neck.  Review thoracic mobility  as needed.  Measure Lt shoulder ROM   Sigurd Sos, PT 02/15/22 11:43 AM

## 2022-03-01 ENCOUNTER — Ambulatory Visit: Payer: No Typology Code available for payment source | Attending: Hematology and Oncology

## 2022-03-01 DIAGNOSIS — M25612 Stiffness of left shoulder, not elsewhere classified: Secondary | ICD-10-CM | POA: Diagnosis present

## 2022-03-01 DIAGNOSIS — Z483 Aftercare following surgery for neoplasm: Secondary | ICD-10-CM | POA: Diagnosis present

## 2022-03-01 DIAGNOSIS — R252 Cramp and spasm: Secondary | ICD-10-CM | POA: Insufficient documentation

## 2022-03-01 DIAGNOSIS — R293 Abnormal posture: Secondary | ICD-10-CM | POA: Insufficient documentation

## 2022-03-01 NOTE — Therapy (Signed)
OUTPATIENT PT UPPER EXTREMITY TREATMENT  Patient Name: Haley Evans MRN: [id number] DOB:13-Nov-1980, 42 y.o., female Today's Date: 03/01/2022  END OF SESSION:  PT End of Session - 03/01/22 1309     Visit Number 5    Date for PT Re-Evaluation 03/20/22    Authorization Type BCBS    PT Start Time 1235    PT Stop Time 1310    PT Time Calculation (min) 35 min    Activity Tolerance Patient tolerated treatment well    Behavior During Therapy WFL for tasks assessed/performed                 Past Medical History:  Diagnosis Date   Anemia    Anxiety    Bipolar 2 disorder ([place])    Cancer ([place])    breast   Depression    Family history of adverse reaction to anesthesia    aunt woke up during hip replacement   Seizure ([place])    vs syncopy, age 45 and 12/2017, Seen by neurologist, a definitive diagnosis was not made. Pt reports having seizure aroudn 2021   Past Surgical History:  Procedure Laterality Date   ARM WOUND REPAIR / CLOSURE Right    fractured arm   BREAST IMPLANT REMOVAL Left 12/30/2021   Procedure: REMOVAL LEFT CHEST TISSUE EXPANDER;  Surgeon: Irene Limbo, MD;  Location: [facility];  Service: Plastics;  Laterality: Left;   BREAST RECONSTRUCTION WITH PLACEMENT OF TISSUE EXPANDER AND FLEX HD (ACELLULAR HYDRATED DERMIS) Left 06/15/2021   Procedure: REPLACEMENT OF TISSUE EXPANDER AND FLEX HD (ACELLULAR HYDRATED DERMIS);  Surgeon: Wallace Going, DO;  Location: [facility];  Service: Plastics;  Laterality: Left;   CESAREAN SECTION     DEBRIDEMENT AND CLOSURE WOUND Left 06/15/2021   Procedure: DEBRIDEMENT AND CLOSURE LEFT BREAST WOUND;  Surgeon: Wallace Going, DO;  Location: [facility];  Service: Plastics;  Laterality: Left;  1 hour   LATISSIMUS FLAP TO BREAST Left 07/18/2021   Procedure: LATISSIMUS FLAP TO BREAST;  Surgeon: Wallace Going, DO;  Location: [facility];  Service: Plastics;  Laterality: Left;   NIPPLE SPARING MASTECTOMY Right 05/12/2021    Procedure: RIGHT NIPPLE SPARING MASTECTOMY;  Surgeon: Jovita Kussmaul, MD;  Location: [facility];  Service: General;  Laterality: Right;   NIPPLE SPARING MASTECTOMY WITH SENTINEL LYMPH NODE BIOPSY Left 05/12/2021   Procedure: LEFT NIPPLE SPARING MASTECTOMY WITH LEFT SENTINEL LYMPH NODE BIOPSY;  Surgeon: Jovita Kussmaul, MD;  Location: [facility];  Service: General;  Laterality: Left;   PRE-PECTORAL BREAST RECON W/ PLACEMENT OF TISSUE EXPANDERS, DERMAL GRAFT Bilateral 05/12/2021   Procedure: IMMEDIATE BILATERAL BREAST RECONSTRUCTION WITH PLACEMENT OF TISSUE EXPANDERS AND FLEX HD;  Surgeon: Wallace Going, DO;  Location: [facility];  Service: Plastics;  Laterality: Bilateral;   REMOVAL OF TISSUE EXPANDER Left 10/27/2021   Procedure: Left breast seroma drainage with tissue expander exchange;  Surgeon: Wallace Going, DO;  Location: [facility];  Service: Plastics;  Laterality: Left;   TISSUE EXPANDER PLACEMENT Left 07/18/2021   Procedure: REMOVAL AND REPLACEMENT OF TISSUE EXPANDER;  Surgeon: Wallace Going, DO;  Location: [facility];  Service: Plastics;  Laterality: Left;  3 hours   Patient Active Problem List   Diagnosis Date Noted   History of breast cancer 12/30/2021   Admission for removal of tissue expander of breast, unspecified laterality 07/18/2021   Acquired absence of left breast 07/18/2021   Open wound of left  breast 07/15/2021   Breast cancer ([place]) 05/12/2021   Genetic testing 03/18/2021   Family history of pancreatic cancer 03/10/2021   Malignant neoplasm of upper-outer quadrant of left breast in female, estrogen receptor positive ([place]) 03/07/2021   Anxiety 06/25/2011    REFERRING PROVIDER: Irene Limbo  REFERRING DIAG: Left breast cancer  THERAPY DIAG:  Aftercare following surgery for neoplasm  Cramp and spasm  Stiffness of left shoulder, not elsewhere classified  Abnormal posture  ONSET DATE: 12/30/2021  Rationale for  Evaluation and Treatment: Rehabilitation  SUBJECTIVE                                                                                                                                                                                           SUBJECTIVE STATEMENT: I feel like I am moving in a positive direction.  I am using my pilates machine and stretching and I'm walking 2x/day.  90% overall improvement.   PERTINENT HISTORY: Bilateral nipple sparing mastectomy and left sentinel node biopsy (3 negative nodes) on 05/12/2021 with expanders placed for reconstruction. It is ER/PR positive and HER2 negative with a Ki67 of 1%. Muscle flap reconstruction on left performed 2/77/82 due to complications. 10/27/2021 Left tissue expander exchange with seroma drainage. 12/30/2021 - left tissue expander removed.  PAIN:  Are you having pain? Yes NPRS scale: 0/10 Pain location: Left scapula, bil neck  Pain orientation: Bilateral  PAIN TYPE: tingling and stinging Pain description: constant  Aggravating factors: Nothing really makes it worse; kind of constant Relieving factors: medication  PRECAUTIONS: Other: Left arm lymphedema risk  WEIGHT BEARING RESTRICTIONS: No  FALLS:  Has patient fallen in last 6 months? 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 Incisions appear to be healing well. No signs of infection.  OBSERVATIONS / OTHER ASSESSMENTS: Left scapula with tightness and poor mobility along her ribs  posteriorly.  SENSATION: Hypersensitivity to left anterior chest where latissimus is now located.  POSTURE: Forward head, rounded shoulders  HAND DOMINANCE: Right  UPPER EXTREMITY AROM/PROM:  A/PROM RIGHT   eval   Shoulder extension 54  Shoulder flexion 151  Shoulder abduction 148  Shoulder internal rotation 79  Shoulder external rotation 84    (  Blank rows = not tested)  A/PROM LEFT   eval Left 03/01/22  Shoulder extension 39   Shoulder flexion 124 160  Shoulder abduction 140 140  Shoulder internal rotation 85   Shoulder external rotation 75     (Blank rows = not tested)   CERVICAL AROM:  Flexion WNL  Extension WNL  Right lateral flexion WNL  Left lateral flexion WNL  Right rotation WNL  Left rotation WNL    UPPER EXTREMITY STRENGTH: Not tested   LYMPHEDEMA ASSESSMENTS:   SURGERY TYPE/DATE: Bilateral mastectomy 05/12/2021 with left sentinel node biopsy; 4 additional surgeries over past year due to reconstruction complications  NUMBER OF LYMPH NODES REMOVED: 3  CHEMOTHERAPY: no   RADIATION:no  HORMONE TREATMENT: stopped Tamoxifen due to headaches  INFECTIONS: Cellulitis left breast  LYMPHEDEMA ASSESSMENTS:   LANDMARK RIGHT eval  10 cm proximal to olecranon process 32  Olecranon process 27  10 cm proximal to ulnar styloid process 22.4  Just proximal to ulnar styloid process 16.4  Across hand at thumb web space 20.3  At base of 2nd digit 6.5  (Blank rows = not tested)  LANDMARK LEFT  eval  10 cm proximal to olecranon process 32.2  Olecranon process 26.2  10 cm proximal to ulnar styloid process 22  Just proximal to ulnar styloid process 16.4  Across hand at thumb web space 19.3  At base of 2nd digit 6.2  (Blank rows = not tested)  TREATMENT: Date:  03/01/22 Trigger Point Dry-Needling  Treatment instructions: Expect mild to moderate muscle soreness. S/S of pneumothorax if dry needled over a lung field, and to seek immediate medical attention  should they occur. Patient verbalized understanding of these instructions and education.  Patient Consent Given: Yes Education handout provided: Previously provided Muscles treated: Lt lats at ribs laterally Treatment response/outcome: Utilized skilled palpation to identify trigger points.  During dry needling able to palpate muscle twitch and muscle elongation  Elongation and release to Lt scapular musculature and scap mobs.   Scar mobs at distal Lt breast  Skilled palpation and monitoring by PT during dry needling  Date:  02/15/22 Trigger Point Dry-Needling  Treatment instructions: Expect mild to moderate muscle soreness. S/S of pneumothorax if dry needled over a lung field, and to seek immediate medical attention should they occur. Patient verbalized understanding of these instructions and education.  Patient Consent Given: Yes Education handout provided: Previously provided Muscles treated: Lt lats at ribs laterally Treatment response/outcome: Utilized skilled palpation to identify trigger points.  During dry needling able to palpate muscle twitch and muscle elongation  Elongation and release to Lt scapular musculature and scap mobs.   Scar mobs at distal Lt breast  Skilled palpation and monitoring by PT during dry needling  Date:  02/08/22 Trigger Point Dry-Needling  Treatment instructions: Expect mild to moderate muscle soreness. S/S of pneumothorax if dry needled over a lung field, and to seek immediate medical attention should they occur. Patient verbalized understanding of these instructions and education.  Patient Consent Given: Yes Education handout provided: Previously provided Muscles treated: Lt subscapularis, rhomboids, bil upper traps, bil cervical multifidi. Treatment response/outcome: Utilized skilled palpation to identify trigger points.  During dry needling able to palpate muscle twitch and muscle elongation  Elongation and release to Lt scapular musculature and scap  mobs.   Skilled palpation and monitoring by PT during dry needling   Date:  02/01/22 Trigger Point Dry-Needling  Treatment instructions: Expect mild to moderate muscle soreness. S/S of pneumothorax if  dry needled over a lung field, and to seek immediate medical attention should they occur. Patient verbalized understanding of these instructions and education.  Patient Consent Given: Yes Education handout provided: Previously provided Muscles treated: Lt subscapularis, rhomboids, bil upper traps, bil cervical multifidi. Treatment response/outcome: Utilized skilled palpation to identify trigger points.  During dry needling able to palpate muscle twitch and muscle elongation  Elongation and release to Lt scapular musculature and scap mobs.  Scar mobility and educated on how to do this at home.   Skilled palpation and monitoring by PT during dry needling    HOME EXERCISE PROGRAM: Access Code: [email or web address] URL: [email or web address] Date: 02/01/2022 Prepared by: Claiborne Billings  Exercises - Seated Cervical Flexion AROM  - 3 x daily - 7 x weekly - 1 sets - 3 reps - 20 hold - Seated Cervical Sidebending AROM  - 3 x daily - 7 x weekly - 1 sets - 3 reps - 20 hold - Seated Cervical Rotation AROM  - 3 x daily - 7 x weekly - 1 sets - 3 reps - 20 hold - Seated Correct Posture  - 1 x daily - 7 x weekly - 3 sets - 10 reps - Sidelying Open Book Thoracic Rotation with Knee on Foam Roll  - 2 x daily - 7 x weekly - 1 sets - 10 reps - Cat-Camel  - 2 x daily - 7 x weekly - 1 sets - 10 reps - 5 hold - Child's Pose Stretch  - 1 x daily - 7 x weekly - 3 sets - 10 reps - Child's Pose with Sidebending  - 1 x daily - 7 x weekly - 3 sets - 10 reps - Child's Pose with Thread the Needle  - 1 x daily - 7 x weekly - 1 sets - 10 reps  ASSESSMENT:  CLINICAL IMPRESSION: Pt continues to report 90% overall improvement in symptoms since the start of care.  Her main area of discomfort is over Lt lateral ribs at lats and  session focused on this with DN to this region followed by soft tissue mobilization and scar mobs over lat and ribs.  Pt with significant improvement in sensitivity over this region and was able to tolerate increased pressure with this today.  Pt has been walking 2x/day and is using [employer] for stretching.   Lt shoulder flexion A/ROM is 160 degrees, meeting goal.  Patient will benefit from skilled PT to address the below impairments and improve overall function.   OBJECTIVE IMPAIRMENTS: decreased ROM, decreased strength, increased fascial restrictions, impaired flexibility, impaired UE functional use, postural dysfunction, and pain.   ACTIVITY LIMITATIONS: carrying, lifting, and reach over head  PARTICIPATION LIMITATIONS: cleaning  PERSONAL FACTORS: Time since onset of injury/illness/exacerbation are also affecting patient's functional outcome.   REHAB POTENTIAL: Good  CLINICAL DECISION MAKING: Stable/uncomplicated  EVALUATION COMPLEXITY: Low  GOALS: Goals reviewed with patient? Yes  SHORT TERM GOALS: Target date: 02/20/2022  Patient will report >/= 25% reduction in pain in left chest to tolerate daily tasks with greater ease. Goal status: MET  2.  Patient will demonstrate proper technique with her HEP. Goal status: INITIAL  3.  Patient will regain shoulder flexion and abduction to >/= 150 degrees for increased ease reaching. Goal status: INITIAL  4.  Patient will improve her DASH score to </= 20 for improved overall UE function. Goal status: INITIAL  LONG TERM GOALS: Target date: 03/20/2022  Patient will report >/= 25% reduction in pain in left chest  to tolerate daily tasks with greater ease. Goal status: MET (pt reports 90% on 03/01/22)  2.  Patient will verbalize good understanding of a safe self-progression of her HEP Goal status: INITIAL  3.  Patient will increase left shoulder flexion and abduction to >/= 155 degrees for increased ease reaching  overhead. Baseline: abduction 140, flexion 160  Goal status: in progress   4.  Patient will improve her DASH score to </= 14 for improved overall UE function. Goal status: INITIAL  PLAN:  PT FREQUENCY: 1x/week  PT DURATION: 8 weeks  PLANNED INTERVENTIONS: Therapeutic exercises, Therapeutic activity, Patient/Family education, Self Care, Joint mobilization, Dry Needling, Manual lymph drainage, scar mobilization, Manual therapy, and Re-evaluation  PLAN FOR NEXT SESSION: Mobilize Lt lat flap, incision, DN to scapula and neck.  Review thoracic mobility as needed. DASH   Sigurd Sos, PT 03/01/22 1:12 PM

## 2022-03-08 ENCOUNTER — Ambulatory Visit: Payer: No Typology Code available for payment source | Attending: Plastic Surgery

## 2022-03-08 DIAGNOSIS — R252 Cramp and spasm: Secondary | ICD-10-CM | POA: Insufficient documentation

## 2022-03-08 DIAGNOSIS — Z483 Aftercare following surgery for neoplasm: Secondary | ICD-10-CM | POA: Diagnosis not present

## 2022-03-08 DIAGNOSIS — M25612 Stiffness of left shoulder, not elsewhere classified: Secondary | ICD-10-CM | POA: Insufficient documentation

## 2022-03-08 DIAGNOSIS — R293 Abnormal posture: Secondary | ICD-10-CM | POA: Insufficient documentation

## 2022-03-08 NOTE — Therapy (Signed)
OUTPATIENT PT UPPER EXTREMITY TREATMENT  Patient Name: Haley Evans MRN: [id number] DOB:07-30-1980, 42 y.o., female Today's Date: 03/08/2022  END OF SESSION:  PT End of Session - 03/08/22 1658     Visit Number 6    Date for PT Re-Evaluation 03/20/22    Authorization Type [employer]    PT Start Time 1619    PT Stop Time 1654    PT Time Calculation (min) 35 min    Activity Tolerance Patient tolerated treatment well    Behavior During Therapy WFL for tasks assessed/performed                  Past Medical History:  Diagnosis Date   Anemia    Anxiety    Bipolar 2 disorder ([place])    Cancer ([place])    breast   Depression    Family history of adverse reaction to anesthesia    aunt woke up during hip replacement   Seizure ([place])    vs syncopy, age 12 and 12/2017, Seen by neurologist, a definitive diagnosis was not made. Pt reports having seizure aroudn 2021   Past Surgical History:  Procedure Laterality Date   ARM WOUND REPAIR / CLOSURE Right    fractured arm   BREAST IMPLANT REMOVAL Left 12/30/2021   Procedure: REMOVAL LEFT CHEST TISSUE EXPANDER;  Surgeon: Irene Limbo, MD;  Location: [facility];  Service: Plastics;  Laterality: Left;   BREAST RECONSTRUCTION WITH PLACEMENT OF TISSUE EXPANDER AND FLEX HD (ACELLULAR HYDRATED DERMIS) Left 06/15/2021   Procedure: REPLACEMENT OF TISSUE EXPANDER AND FLEX HD (ACELLULAR HYDRATED DERMIS);  Surgeon: Wallace Going, DO;  Location: [facility];  Service: Plastics;  Laterality: Left;   CESAREAN SECTION     DEBRIDEMENT AND CLOSURE WOUND Left 06/15/2021   Procedure: DEBRIDEMENT AND CLOSURE LEFT BREAST WOUND;  Surgeon: Wallace Going, DO;  Location: [facility];  Service: Plastics;  Laterality: Left;  1 hour   LATISSIMUS FLAP TO BREAST Left 07/18/2021   Procedure: LATISSIMUS FLAP TO BREAST;  Surgeon: Wallace Going, DO;  Location: [facility];  Service: Plastics;  Laterality: Left;   NIPPLE SPARING MASTECTOMY Right 05/12/2021    Procedure: RIGHT NIPPLE SPARING MASTECTOMY;  Surgeon: Jovita Kussmaul, MD;  Location: [facility];  Service: General;  Laterality: Right;   NIPPLE SPARING MASTECTOMY WITH SENTINEL LYMPH NODE BIOPSY Left 05/12/2021   Procedure: LEFT NIPPLE SPARING MASTECTOMY WITH LEFT SENTINEL LYMPH NODE BIOPSY;  Surgeon: Jovita Kussmaul, MD;  Location: [facility];  Service: General;  Laterality: Left;   PRE-PECTORAL BREAST RECON W/ PLACEMENT OF TISSUE EXPANDERS, DERMAL GRAFT Bilateral 05/12/2021   Procedure: IMMEDIATE BILATERAL BREAST RECONSTRUCTION WITH PLACEMENT OF TISSUE EXPANDERS AND FLEX HD;  Surgeon: Wallace Going, DO;  Location: [facility];  Service: Plastics;  Laterality: Bilateral;   REMOVAL OF TISSUE EXPANDER Left 10/27/2021   Procedure: Left breast seroma drainage with tissue expander exchange;  Surgeon: Wallace Going, DO;  Location: [facility];  Service: Plastics;  Laterality: Left;   TISSUE EXPANDER PLACEMENT Left 07/18/2021   Procedure: REMOVAL AND REPLACEMENT OF TISSUE EXPANDER;  Surgeon: Wallace Going, DO;  Location: [facility];  Service: Plastics;  Laterality: Left;  3 hours   Patient Active Problem List   Diagnosis Date Noted   History of breast cancer 12/30/2021   Admission for removal of tissue expander of breast, unspecified laterality 07/18/2021   Acquired absence of left breast 07/18/2021   Open wound of  left breast 07/15/2021   Breast cancer ([place]) 05/12/2021   Genetic testing 03/18/2021   Family history of pancreatic cancer 03/10/2021   Malignant neoplasm of upper-outer quadrant of left breast in female, estrogen receptor positive ([place]) 03/07/2021   Anxiety 06/25/2011    REFERRING PROVIDER: Irene Limbo  REFERRING DIAG: Left breast cancer  THERAPY DIAG:  Aftercare following surgery for neoplasm  Cramp and spasm  Stiffness of left shoulder, not elsewhere classified  Abnormal posture  ONSET DATE: 12/30/2021  Rationale  for Evaluation and Treatment: Rehabilitation  SUBJECTIVE                                                                                                                                                                                           SUBJECTIVE STATEMENT: I've been doing stretches and exercise everyday.  I have an app I use for stretching.  90% overall improvement.   PERTINENT HISTORY: Bilateral nipple sparing mastectomy and left sentinel node biopsy (3 negative nodes) on 05/12/2021 with expanders placed for reconstruction. It is ER/PR positive and HER2 negative with a Ki67 of 1%. Muscle flap reconstruction on left performed 6/72/09 due to complications. 10/27/2021 Left tissue expander exchange with seroma drainage. 12/30/2021 - left tissue expander removed.  PAIN:  Are you having pain? Yes NPRS scale: 0-2/10 Pain location: Left scapula, bil neck  Pain orientation: Bilateral  PAIN TYPE: tingling and stinging Pain description: constant  Aggravating factors: Nothing really makes it worse; kind of constant Relieving factors: medication  PRECAUTIONS: Other: Left arm lymphedema risk  WEIGHT BEARING RESTRICTIONS: No  FALLS:  Has patient fallen in last 6 months? 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 Incisions appear to be healing well. No signs of infection.  OBSERVATIONS / OTHER ASSESSMENTS: Left scapula with tightness and poor mobility along her ribs posteriorly.  SENSATION: Hypersensitivity to left anterior  chest where latissimus is now located.  POSTURE: Forward head, rounded shoulders  HAND DOMINANCE: Right  UPPER EXTREMITY AROM/PROM:  A/PROM RIGHT   eval   Shoulder extension 54  Shoulder flexion 151  Shoulder abduction 148  Shoulder internal rotation 79  Shoulder external rotation 84    (Blank rows = not tested)  A/PROM LEFT   eval Left 03/01/22  Shoulder extension 39   Shoulder flexion 124 160  Shoulder abduction 140 140  Shoulder internal rotation 85   Shoulder external rotation 75     (Blank rows = not tested)   CERVICAL AROM:  Flexion WNL  Extension WNL  Right lateral flexion WNL  Left lateral flexion WNL  Right rotation WNL  Left rotation WNL    UPPER EXTREMITY STRENGTH: Not tested   LYMPHEDEMA ASSESSMENTS:   SURGERY TYPE/DATE: Bilateral mastectomy 05/12/2021 with left sentinel node biopsy; 4 additional surgeries over past year due to reconstruction complications  NUMBER OF LYMPH NODES REMOVED: 3  CHEMOTHERAPY: no   RADIATION:no  HORMONE TREATMENT: stopped Tamoxifen due to headaches  INFECTIONS: Cellulitis left breast  LYMPHEDEMA ASSESSMENTS:   LANDMARK RIGHT eval  10 cm proximal to olecranon process 32  Olecranon process 27  10 cm proximal to ulnar styloid process 22.4  Just proximal to ulnar styloid process 16.4  Across hand at thumb web space 20.3  At base of 2nd digit 6.5  (Blank rows = not tested)  LANDMARK LEFT  eval  10 cm proximal to olecranon process 32.2  Olecranon process 26.2  10 cm proximal to ulnar styloid process 22  Just proximal to ulnar styloid process 16.4  Across hand at thumb web space 19.3  At base of 2nd digit 6.2  (Blank rows = not tested)  TREATMENT: Date:  03/08/22 Trigger Point Dry-Needling  Treatment instructions: Expect mild to moderate muscle soreness. S/S of pneumothorax if dry needled over a lung field, and to seek immediate medical attention should they occur. Patient verbalized understanding of these  instructions and education.  Patient Consent Given: Yes Education handout provided: Previously provided Muscles treated: Lt lats at ribs laterally, posterior deltoid Lt and Lt lateral pec Treatment response/outcome: Utilized skilled palpation to identify trigger points.  During dry needling able to palpate muscle twitch and muscle elongation  Elongation and release to Lt lats    Scar mobs at distal Lt breast  Skilled palpation and monitoring by PT during dry needling  Date:  03/01/22 Trigger Point Dry-Needling  Treatment instructions: Expect mild to moderate muscle soreness. S/S of pneumothorax if dry needled over a lung field, and to seek immediate medical attention should they occur. Patient verbalized understanding of these instructions and education.  Patient Consent Given: Yes Education handout provided: Previously provided Muscles treated: Lt lats at ribs laterally Treatment response/outcome: Utilized skilled palpation to identify trigger points.  During dry needling able to palpate muscle twitch and muscle elongation  Elongation and release to Lt scapular musculature and scap mobs.   Scar mobs at distal Lt breast  Skilled palpation and monitoring by PT during dry needling  Date:  02/15/22 Trigger Point Dry-Needling  Treatment instructions: Expect mild to moderate muscle soreness. S/S of pneumothorax if dry needled over a lung field, and to seek immediate medical attention should they occur. Patient verbalized understanding of these instructions and education.  Patient Consent Given: Yes Education handout provided: Previously provided Muscles treated: Lt lats at ribs laterally Treatment response/outcome: Utilized skilled palpation to identify trigger points.  During dry needling able to palpate muscle twitch and muscle elongation  Elongation and release to Lt scapular musculature and scap mobs.   Scar mobs at distal Lt breast  Skilled palpation and monitoring by PT during dry  needling  HOME EXERCISE PROGRAM: Access Code: [email or web address] URL: [email or web address] Date: 02/01/2022 Prepared by: Claiborne Billings  Exercises - Seated Cervical Flexion  AROM  - 3 x daily - 7 x weekly - 1 sets - 3 reps - 20 hold - Seated Cervical Sidebending AROM  - 3 x daily - 7 x weekly - 1 sets - 3 reps - 20 hold - Seated Cervical Rotation AROM  - 3 x daily - 7 x weekly - 1 sets - 3 reps - 20 hold - Seated Correct Posture  - 1 x daily - 7 x weekly - 3 sets - 10 reps - Sidelying Open Book Thoracic Rotation with Knee on Foam Roll  - 2 x daily - 7 x weekly - 1 sets - 10 reps - Cat-Camel  - 2 x daily - 7 x weekly - 1 sets - 10 reps - 5 hold - Child's Pose Stretch  - 1 x daily - 7 x weekly - 3 sets - 10 reps - Child's Pose with Sidebending  - 1 x daily - 7 x weekly - 3 sets - 10 reps - Child's Pose with Thread the Needle  - 1 x daily - 7 x weekly - 1 sets - 10 reps  ASSESSMENT:  CLINICAL IMPRESSION: Pt continues to report 90% overall improvement in symptoms since the start of care.  Her main area of discomfort continues to be over the Lt lateral ribs at lats and session focused on this with DN to this region followed by soft tissue mobilization and scar mobs over lat and ribs.  Pt with significant improvement in sensitivity over this region and denies any sensitivity over the skin or incision on the Lt.  Pt has been walking 2x/day and is using and app for stretching daily. Pt with good response to DN today with twitch and improved tissue mobility.  Patient will benefit from skilled PT to address the below impairments and improve overall function.   OBJECTIVE IMPAIRMENTS: decreased ROM, decreased strength, increased fascial restrictions, impaired flexibility, impaired UE functional use, postural dysfunction, and pain.   ACTIVITY LIMITATIONS: carrying, lifting, and reach over head  PARTICIPATION LIMITATIONS: cleaning  PERSONAL FACTORS: Time since onset of injury/illness/exacerbation are also  affecting patient's functional outcome.   REHAB POTENTIAL: Good  CLINICAL DECISION MAKING: Stable/uncomplicated  EVALUATION COMPLEXITY: Low  GOALS: Goals reviewed with patient? Yes  SHORT TERM GOALS: Target date: 02/20/2022  Patient will report >/= 25% reduction in pain in left chest to tolerate daily tasks with greater ease. Goal status: MET  2.  Patient will demonstrate proper technique with her HEP. Goal status: MET  3.  Patient will regain shoulder flexion and abduction to >/= 150 degrees for increased ease reaching. Flexion 160, abduction 140 Goal status: INITIAL  4.  Patient will improve her DASH score to </= 20 for improved overall UE function. Goal status: INITIAL  LONG TERM GOALS: Target date: 03/20/2022  Patient will report >/= 25% reduction in pain in left chest to tolerate daily tasks with greater ease. Goal status: MET (pt reports 90% on 03/01/22)  2.  Patient will verbalize good understanding of a safe self-progression of her HEP Goal status: In progress   3.  Patient will increase left shoulder flexion and abduction to >/= 155 degrees for increased ease reaching overhead. Baseline: abduction 140, flexion 160  Goal status: in progress   4.  Patient will improve her DASH score to </= 14 for improved overall UE function. Goal status: INITIAL  PLAN:  PT FREQUENCY: 1x/week  PT DURATION: 8 weeks  PLANNED INTERVENTIONS: Therapeutic exercises, Therapeutic activity, Patient/Family education, Self Care, Joint  mobilization, Dry Needling, Manual lymph drainage, scar mobilization, Manual therapy, and Re-evaluation  PLAN FOR NEXT SESSION: Mobilize Lt lat flap, incision, DN to scapula and neck.  Review thoracic mobility as needed. DASH   Sigurd Sos, PT 03/08/22 4:58 PM

## 2022-03-15 ENCOUNTER — Other Ambulatory Visit: Payer: Self-pay | Admitting: *Deleted

## 2022-03-15 ENCOUNTER — Ambulatory Visit: Payer: No Typology Code available for payment source

## 2022-03-15 DIAGNOSIS — R252 Cramp and spasm: Secondary | ICD-10-CM

## 2022-03-15 DIAGNOSIS — Z483 Aftercare following surgery for neoplasm: Secondary | ICD-10-CM | POA: Diagnosis not present

## 2022-03-15 DIAGNOSIS — M25612 Stiffness of left shoulder, not elsewhere classified: Secondary | ICD-10-CM

## 2022-03-15 DIAGNOSIS — C50412 Malignant neoplasm of upper-outer quadrant of left female breast: Secondary | ICD-10-CM

## 2022-03-15 NOTE — Therapy (Signed)
OUTPATIENT PT UPPER EXTREMITY TREATMENT  Patient Name: Haley Evans MRN: [id number] DOB:03-11-1980, 42 y.o., female Today's Date: 03/15/2022  END OF SESSION:  PT End of Session - 03/15/22 1623     Visit Number 7    Authorization Type BCBS    PT Start Time 1621    PT Stop Time 1656    PT Time Calculation (min) 35 min    Activity Tolerance Patient tolerated treatment well    Behavior During Therapy WFL for tasks assessed/performed                   Past Medical History:  Diagnosis Date   Anemia    Anxiety    Bipolar 2 disorder ([place])    Cancer ([place])    breast   Depression    Family history of adverse reaction to anesthesia    aunt woke up during hip replacement   Seizure ([place])    vs syncopy, age 51 and 12/2017, Seen by neurologist, a definitive diagnosis was not made. Pt reports having seizure aroudn 2021   Past Surgical History:  Procedure Laterality Date   ARM WOUND REPAIR / CLOSURE Right    fractured arm   BREAST IMPLANT REMOVAL Left 12/30/2021   Procedure: REMOVAL LEFT CHEST TISSUE EXPANDER;  Surgeon: Irene Limbo, MD;  Location: [place];  Service: Plastics;  Laterality: Left;   BREAST RECONSTRUCTION WITH PLACEMENT OF TISSUE EXPANDER AND FLEX HD (ACELLULAR HYDRATED DERMIS) Left 06/15/2021   Procedure: REPLACEMENT OF TISSUE EXPANDER AND FLEX HD (ACELLULAR HYDRATED DERMIS);  Surgeon: Wallace Going, DO;  Location: [place];  Service: Plastics;  Laterality: Left;   CESAREAN SECTION     DEBRIDEMENT AND CLOSURE WOUND Left 06/15/2021   Procedure: DEBRIDEMENT AND CLOSURE LEFT BREAST WOUND;  Surgeon: Wallace Going, DO;  Location: [place];  Service: Plastics;  Laterality: Left;  1 hour   LATISSIMUS FLAP TO BREAST Left 07/18/2021   Procedure: LATISSIMUS FLAP TO BREAST;  Surgeon: Wallace Going, DO;  Location: [place];  Service: Plastics;  Laterality: Left;   NIPPLE SPARING MASTECTOMY Right 05/12/2021   Procedure: RIGHT NIPPLE SPARING  MASTECTOMY;  Surgeon: Jovita Kussmaul, MD;  Location: [place];  Service: General;  Laterality: Right;   NIPPLE SPARING MASTECTOMY WITH SENTINEL LYMPH NODE BIOPSY Left 05/12/2021   Procedure: LEFT NIPPLE SPARING MASTECTOMY WITH LEFT SENTINEL LYMPH NODE BIOPSY;  Surgeon: Jovita Kussmaul, MD;  Location: [place];  Service: General;  Laterality: Left;   PRE-PECTORAL BREAST RECON W/ PLACEMENT OF TISSUE EXPANDERS, DERMAL GRAFT Bilateral 05/12/2021   Procedure: IMMEDIATE BILATERAL BREAST RECONSTRUCTION WITH PLACEMENT OF TISSUE EXPANDERS AND FLEX HD;  Surgeon: Wallace Going, DO;  Location: [place];  Service: Plastics;  Laterality: Bilateral;   REMOVAL OF TISSUE EXPANDER Left 10/27/2021   Procedure: Left breast seroma drainage with tissue expander exchange;  Surgeon: Wallace Going, DO;  Location: [place];  Service: Plastics;  Laterality: Left;   TISSUE EXPANDER PLACEMENT Left 07/18/2021   Procedure: REMOVAL AND REPLACEMENT OF TISSUE EXPANDER;  Surgeon: Wallace Going, DO;  Location: [place];  Service: Plastics;  Laterality: Left;  3 hours   Patient Active Problem List   Diagnosis Date Noted   History of breast cancer 12/30/2021   Admission for removal of tissue expander of breast, unspecified laterality 07/18/2021   Acquired absence of left breast 07/18/2021   Open wound of left breast 07/15/2021   Breast cancer (  [place]) 05/12/2021   Genetic testing 03/18/2021   Family history of pancreatic cancer 03/10/2021   Malignant neoplasm of upper-outer quadrant of left breast in female, estrogen receptor positive ([place]) 03/07/2021   Anxiety 06/25/2011    REFERRING PROVIDER: Irene Limbo  REFERRING DIAG: Left breast cancer  THERAPY DIAG:  Aftercare following surgery for neoplasm  Cramp and spasm  Stiffness of left shoulder, not elsewhere classified  ONSET DATE: 12/30/2021  Rationale for Evaluation and Treatment:  Rehabilitation  SUBJECTIVE                                                                                                                                                                                           SUBJECTIVE STATEMENT: I got a massage yesterday and it was excellent.  It broke up a lot of places. 90% overall improvement.   PERTINENT HISTORY: Bilateral nipple sparing mastectomy and left sentinel node biopsy (3 negative nodes) on 05/12/2021 with expanders placed for reconstruction. It is ER/PR positive and HER2 negative with a Ki67 of 1%. Muscle flap reconstruction on left performed 06/06/79 due to complications. 10/27/2021 Left tissue expander exchange with seroma drainage. 12/30/2021 - left tissue expander removed.  PAIN:  Are you having pain? Yes NPRS scale: 0/10 Pain location: Left scapula and lats  Pain orientation: Bilateral  PAIN TYPE: tingling and stinging Pain description: constant  Aggravating factors: Nothing really makes it worse; kind of constant Relieving factors: medication  PRECAUTIONS: Other: Left arm lymphedema risk  WEIGHT BEARING RESTRICTIONS: No  FALLS:  Has patient fallen in last 6 months? 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 Incisions appear to be healing well. No signs of infection.  OBSERVATIONS / OTHER ASSESSMENTS: Left scapula with tightness and poor mobility along her ribs posteriorly.  SENSATION: Hypersensitivity to left anterior chest where latissimus is now  located.  POSTURE: Forward head, rounded shoulders  HAND DOMINANCE: Right  UPPER EXTREMITY AROM/PROM:  A/PROM RIGHT   eval   Shoulder extension 54  Shoulder flexion 151  Shoulder abduction 148  Shoulder internal rotation 79  Shoulder external rotation 84    (Blank rows = not tested)  A/PROM LEFT   eval Left 03/01/22  Shoulder  extension 39   Shoulder flexion 124 160  Shoulder abduction 140 140  Shoulder internal rotation 85   Shoulder external rotation 75     (Blank rows = not tested)   CERVICAL AROM:  Flexion WNL  Extension WNL  Right lateral flexion WNL  Left lateral flexion WNL  Right rotation WNL  Left rotation WNL    UPPER EXTREMITY STRENGTH: Not tested   LYMPHEDEMA ASSESSMENTS:   SURGERY TYPE/DATE: Bilateral mastectomy 05/12/2021 with left sentinel node biopsy; 4 additional surgeries over past year due to reconstruction complications  NUMBER OF LYMPH NODES REMOVED: 3  CHEMOTHERAPY: no   RADIATION:no  HORMONE TREATMENT: stopped Tamoxifen due to headaches  INFECTIONS: Cellulitis left breast  LYMPHEDEMA ASSESSMENTS:   LANDMARK RIGHT eval  10 cm proximal to olecranon process 32  Olecranon process 27  10 cm proximal to ulnar styloid process 22.4  Just proximal to ulnar styloid process 16.4  Across hand at thumb web space 20.3  At base of 2nd digit 6.5  (Blank rows = not tested)  LANDMARK LEFT  eval  10 cm proximal to olecranon process 32.2  Olecranon process 26.2  10 cm proximal to ulnar styloid process 22  Just proximal to ulnar styloid process 16.4  Across hand at thumb web space 19.3  At base of 2nd digit 6.2  (Blank rows = not tested)  TREATMENT: Date:  03/15/22 Trigger Point Dry-Needling  Treatment instructions: Expect mild to moderate muscle soreness. S/S of pneumothorax if dry needled over a lung field, and to seek immediate medical attention should they occur. Patient verbalized understanding of these instructions and  education.  Patient Consent Given: Yes Education handout provided: Previously provided Muscles treated: Lt lats at ribs laterally, posterior deltoid Lt and Lt lateral pec Treatment response/outcome: Utilized skilled palpation to identify trigger points.  During dry needling able to palpate muscle twitch and muscle elongation  Elongation and release to Lt lats    Scar mobs at distal Lt breast  Skilled palpation and monitoring by PT during dry needling  Date:  03/08/22 Trigger Point Dry-Needling  Treatment instructions: Expect mild to moderate muscle soreness. S/S of pneumothorax if dry needled over a lung field, and to seek immediate medical attention should they occur. Patient verbalized understanding of these instructions and education.  Patient Consent Given: Yes Education handout provided: Previously provided Muscles treated: Lt lats at ribs laterally, posterior deltoid Lt and Lt lateral pec Treatment response/outcome: Utilized skilled palpation to identify trigger points.  During dry needling able to palpate muscle twitch and muscle elongation  Elongation and release to Lt lats    Scar mobs at distal Lt breast  Skilled palpation and monitoring by PT during dry needling  Date:  03/01/22 Trigger Point Dry-Needling  Treatment instructions: Expect mild to moderate muscle soreness. S/S of pneumothorax if dry needled over a lung field, and to seek immediate medical attention should they occur. Patient verbalized understanding of these instructions and education.  Patient Consent Given: Yes Education handout provided: Previously provided Muscles treated: Lt lats at ribs laterally Treatment response/outcome: Utilized skilled palpation to identify trigger points.  During dry needling able to palpate muscle twitch and muscle elongation  Elongation and release to Lt scapular musculature and scap mobs.   Scar mobs at distal Lt breast  Skilled palpation and monitoring by PT during dry needling   HOME EXERCISE PROGRAM: Access Code: [email or web address] URL: [email or web address] Date: 02/01/2022 Prepared by: Claiborne Billings  Exercises - Seated Cervical Flexion AROM  - 3 x  daily - 7 x weekly - 1 sets - 3 reps - 20 hold - Seated Cervical Sidebending AROM  - 3 x daily - 7 x weekly - 1 sets - 3 reps - 20 hold - Seated Cervical Rotation AROM  - 3 x daily - 7 x weekly - 1 sets - 3 reps - 20 hold - Seated Correct Posture  - 1 x daily - 7 x weekly - 3 sets - 10 reps - Sidelying Open Book Thoracic Rotation with Knee on Foam Roll  - 2 x daily - 7 x weekly - 1 sets - 10 reps - Cat-Camel  - 2 x daily - 7 x weekly - 1 sets - 10 reps - 5 hold - Child's Pose Stretch  - 1 x daily - 7 x weekly - 3 sets - 10 reps - Child's Pose with Sidebending  - 1 x daily - 7 x weekly - 3 sets - 10 reps - Child's Pose with Thread the Needle  - 1 x daily - 7 x weekly - 1 sets - 10 reps  ASSESSMENT:  CLINICAL IMPRESSION: Pt continues to report 90% overall improvement in symptoms since the start of care.  Her main area of discomfort continues to be over the Lt lateral ribs at lats and session focused on this with DN to this region followed by soft tissue mobilization and scar mobs over lat and ribs.  Pt with significant improvement in sensitivity over this region and denies any sensitivity over the skin or incision on the Lt.  Pt has been walking 2x/day and is using and app for stretching daily. Pt with good response to DN today with twitch and improved tissue mobility. DASH is improved to 16%, partially meeting goal.  Pt will D/C to HEP  OBJECTIVE IMPAIRMENTS: decreased ROM, decreased strength, increased fascial restrictions, impaired flexibility, impaired UE functional use, postural dysfunction, and pain.   ACTIVITY LIMITATIONS: carrying, lifting, and reach over head  PARTICIPATION LIMITATIONS: cleaning  PERSONAL FACTORS: Time since onset of injury/illness/exacerbation are also affecting patient's functional outcome.    REHAB POTENTIAL: Good  CLINICAL DECISION MAKING: Stable/uncomplicated  EVALUATION COMPLEXITY: Low  GOALS: Goals reviewed with patient? Yes  SHORT TERM GOALS: Target date: 02/20/2022  Patient will report >/= 25% reduction in pain in left chest to tolerate daily tasks with greater ease. Goal status: MET  2.  Patient will demonstrate proper technique with her HEP. Goal status: MET  3.  Patient will regain shoulder flexion and abduction to >/= 150 degrees for increased ease reaching. Flexion 160, abduction 140 Goal status: INITIAL  4.  Patient will improve her DASH score to </= 20 for improved overall UE function. Goal status: INITIAL  LONG TERM GOALS: Target date: 03/20/2022  Patient will report >/= 25% reduction in pain in left chest to tolerate daily tasks with greater ease. Goal status: MET (pt reports 90% on 03/01/22)  2.  Patient will verbalize good understanding of a safe self-progression of her HEP Goal status: MET  3.  Patient will increase left shoulder flexion and abduction to >/= 155 degrees for increased ease reaching overhead. Baseline: abduction 140, flexion 160  Goal status:partially met  4.  Patient will improve her DASH score to </= 14 for improved overall UE function. 03/15/22 16% Goal status: partially met  PLAN:  D/C PT to HEP  PHYSICAL THERAPY DISCHARGE SUMMARY  Visits from Start of Care: 7  Current functional level related to goals / functional outcomes: See above for  current status.     Remaining deficits: Lt lat pain/trigger points, painful Lt UE use at times    Education / Equipment: Exercise progression, HEP   Patient agrees to discharge. Patient goals were met. Patient is being discharged due to being pleased with the current functional level.  Sigurd Sos, PT 03/15/22 5:03 PM

## 2022-03-16 ENCOUNTER — Inpatient Hospital Stay (HOSPITAL_BASED_OUTPATIENT_CLINIC_OR_DEPARTMENT_OTHER): Payer: No Typology Code available for payment source | Admitting: Hematology and Oncology

## 2022-03-16 ENCOUNTER — Inpatient Hospital Stay: Payer: No Typology Code available for payment source | Attending: Hematology and Oncology

## 2022-03-16 VITALS — BP 131/91 | HR 80 | Temp 97.2°F | Resp 17 | Wt 202.2 lb

## 2022-03-16 DIAGNOSIS — C50412 Malignant neoplasm of upper-outer quadrant of left female breast: Secondary | ICD-10-CM

## 2022-03-16 DIAGNOSIS — Z170 Estrogen receptor positive status [ER+]: Secondary | ICD-10-CM

## 2022-03-16 DIAGNOSIS — Z9013 Acquired absence of bilateral breasts and nipples: Secondary | ICD-10-CM | POA: Insufficient documentation

## 2022-03-16 DIAGNOSIS — Z79899 Other long term (current) drug therapy: Secondary | ICD-10-CM | POA: Insufficient documentation

## 2022-03-16 LAB — CBC WITH DIFFERENTIAL (CANCER CENTER ONLY)
Abs Immature Granulocytes: 0.02 10*3/uL (ref 0.00–0.07)
Basophils Absolute: 0.1 10*3/uL (ref 0.0–0.1)
Basophils Relative: 1 %
Eosinophils Absolute: 0.1 10*3/uL (ref 0.0–0.5)
Eosinophils Relative: 1 %
HCT: 38.9 % (ref 36.0–46.0)
Hemoglobin: 13.1 g/dL (ref 12.0–15.0)
Immature Granulocytes: 0 %
Lymphocytes Relative: 24 %
Lymphs Abs: 2 10*3/uL (ref 0.7–4.0)
MCH: 30.3 pg (ref 26.0–34.0)
MCHC: 33.7 g/dL (ref 30.0–36.0)
MCV: 90 fL (ref 80.0–100.0)
Monocytes Absolute: 0.6 10*3/uL (ref 0.1–1.0)
Monocytes Relative: 7 %
Neutro Abs: 5.4 10*3/uL (ref 1.7–7.7)
Neutrophils Relative %: 67 %
Platelet Count: 344 10*3/uL (ref 150–400)
RBC: 4.32 MIL/uL (ref 3.87–5.11)
RDW: 12.6 % (ref 11.5–15.5)
WBC Count: 8.1 10*3/uL (ref 4.0–10.5)
nRBC: 0 % (ref 0.0–0.2)

## 2022-03-16 LAB — CMP (CANCER CENTER ONLY)
ALT: 15 U/L (ref 0–44)
AST: 15 U/L (ref 15–41)
Albumin: 3.9 g/dL (ref 3.5–5.0)
Alkaline Phosphatase: 63 U/L (ref 38–126)
Anion gap: 9 (ref 5–15)
BUN: 14 mg/dL (ref 6–20)
CO2: 22 mmol/L (ref 22–32)
Calcium: 9.2 mg/dL (ref 8.9–10.3)
Chloride: 105 mmol/L (ref 98–111)
Creatinine: 0.83 mg/dL (ref 0.44–1.00)
GFR, Estimated: >60 mL/min (ref >60)
Glucose, Bld: 75 mg/dL (ref 70–99)
Potassium: 3.6 mmol/L (ref 3.5–5.1)
Sodium: 136 mmol/L (ref 135–145)
Total Bilirubin: 0.4 mg/dL (ref 0.3–1.2)
Total Protein: 6.6 g/dL (ref 6.5–8.1)

## 2022-03-16 MED ORDER — TAMOXIFEN CITRATE 10 MG PO TABS: 10.0000 mg | ORAL_TABLET | Freq: Every day | ORAL | 3 refills | Status: DC

## 2022-03-16 NOTE — Assessment & Plan Note (Signed)
05/12/2021: Right retroareolar biopsy: Negative, 0/3 sentinel lymph nodes negative Right mastectomy: Benign: UDH Left mastectomy: Grade 2 IDC 1.9 cm margins negative, ER 95%, PR 95%, HER2 negative, Ki-67 1% Oncotype DX score 13: 4% risk of distant recurrence   Treatment plan: Adjuvant antiestrogen therapy   No role of radiation since she had mastectomies and the tumor size was only 1.9 cm.   Return to clinic

## 2022-03-16 NOTE — Progress Notes (Signed)
Patient Care Team: Berkley Harvey, NP as PCP - General (Nurse Practitioner) Mauro Kaufmann, RN as Oncology Nurse Navigator Rockwell Germany, RN as Oncology Nurse Navigator Jovita Kussmaul, MD as Consulting Physician (General Surgery) Nicholas Lose, MD as Consulting Physician (Hematology and Oncology) Gery Pray, MD as Consulting Physician (Radiation Oncology)  DIAGNOSIS:  Encounter Diagnosis  Name Primary?   Malignant neoplasm of upper-outer quadrant of left breast in female, estrogen receptor positive ([place]) Yes    SUMMARY OF ONCOLOGIC HISTORY: Oncology History  Malignant neoplasm of upper-outer quadrant of left breast in female, estrogen receptor positive ([place])  03/01/2021 Initial Diagnosis   Screening mammogram detected left breast mass posteriorly at the axillary tail measuring 1.5 cm: Biopsy: Grade 2 IDC ER 95%, PR 95%, HER2 negative, Ki-67 1%, left breast calcifications 0.8 cm biopsy: Benign, left breast asymmetry?  Axilla negative   03/09/2021 Cancer Staging   Staging form: Breast, AJCC 8th Edition - Clinical stage from 03/09/2021: Stage IA (cT1c, cN0, cM0, G2, ER+, PR+, HER2-) - Signed by Nicholas Lose, MD on 03/09/2021 Stage prefix: Initial diagnosis Histologic grading system: 3 grade system    Genetic Testing   [facility] CancerNext-Expanded Panel is Negative. Report date was 03/21/2021.  The CancerNext-Expanded gene panel offered by [facility] and includes sequencing, rearrangement, and RNA analysis for the following 77 genes: AIP, ALK, APC, ATM, AXIN2, BAP1, BARD1, BLM, BMPR1A, BRCA1, BRCA2, BRIP1, CDC73, CDH1, CDK4, CDKN1B, CDKN2A, CHEK2, CTNNA1, DICER1, FANCC, FH, FLCN, GALNT12, KIF1B, LZTR1, MAX, MEN1, MET, MLH1, MSH2, MSH3, MSH6, MUTYH, NBN, NF1, NF2, NTHL1, PALB2, PHOX2B, PMS2, POT1, PRKAR1A, PTCH1, PTEN, RAD51C, RAD51D, RB1, RECQL, RET, SDHA, SDHAF2, SDHB, SDHC, SDHD, SMAD4, SMARCA4, SMARCB1, SMARCE1, STK11, SUFU, TMEM127, TP53, TSC1, TSC2, VHL and XRCC2 (sequencing and  deletion/duplication); EGFR, EGLN1, HOXB13, KIT, MITF, PDGFRA, POLD1, and POLE (sequencing only); EPCAM and GREM1 (deletion/duplication only).    04/12/2021 Oncotype testing   Oncotype DX score 13: 4% risk of recurrence   05/12/2021 Surgery   05/12/2021: Right retroareolar biopsy: Negative, 0/3 sentinel lymph nodes negative Right mastectomy: Benign: UDH Left mastectomy: Grade 2 IDC 1.9 cm margins negative, ER 95%, PR 95%, HER2 negative, Ki-67 1%     CHIEF COMPLIANT: Follow-up of breast cancer   INTERVAL HISTORY: Haley Evans is a 42 y.o. female is here because of recent diagnosis of left breast cancer. She presents to the clinic for a follow-up. She states that she stop taking the tamoxifen because of headaches , but she says she doesn't think she took it long enough and she is willing to try it again. She states that she had to have surgery because she had an infection under her left axilla. She says it hurts really bad to touch. She complains of not been able to sleep and do normal things. She states she continues to do her stretches and therapy. She also complains of vision been off.      ALLERGIES:  is allergic to morphine.  MEDICATIONS:  Current Outpatient Medications  Medication Sig Dispense Refill   acetaminophen (TYLENOL) 500 MG tablet Take 1,000 mg by mouth every 6 (six) hours as needed for moderate pain.     ALPRAZolam (XANAX) 0.5 MG tablet TAKE 1 TABLET BY MOUTH TWICE DAILY AS NEEDED FOR ANXIETY 10 tablet 0   B Complex-C (SUPER B COMPLEX PO) Take 1 tablet by mouth daily.     Bioflavonoid Products (SUPER C-500 COMPLEX) TBCR Take 1 tablet by mouth daily.     BIOTIN PO Take  1 capsule by mouth daily.     diazepam (VALIUM) 2 MG tablet Take 2 mg by mouth every 6 (six) hours as needed for anxiety.     diclofenac Sodium (VOLTAREN) 1 % GEL Apply 1 Application topically 3 (three) times daily as needed (pain).     gabapentin (NEURONTIN) 100 MG capsule Take 3 capsules (300 mg total) by  mouth 3 (three) times daily. 270 capsule 3   hydrOXYzine (ATARAX) 50 MG tablet Take 50 mg by mouth at bedtime.     ibuprofen (ADVIL) 200 MG tablet Take 200-400 mg by mouth every 8 (eight) hours as needed for moderate pain.     Multiple Vitamin (MULTIVITAMIN WITH MINERALS) TABS tablet Take 1 tablet by mouth daily.     Omega-3 Fatty Acids (FISH OIL PO) Take 2 capsules by mouth daily.     OVER THE COUNTER MEDICATION Take 1 capsule by mouth daily. Beet Root     tamoxifen (NOLVADEX) 10 MG tablet Take 1 tablet (10 mg total) by mouth daily. 90 tablet 3   tizanidine (ZANAFLEX) 2 MG capsule Take 1 capsule (2 mg total) by mouth 3 (three) times daily as needed for muscle spasms. 42 capsule 0   topiramate (TOPAMAX) 25 MG tablet SMARTSIG:1 Tablet(s) By Mouth Every Evening     TURMERIC PO Take 1 capsule by mouth daily.     varenicline (CHANTIX) 1 MG tablet Take 1 mg by mouth daily.     VITAMIN E PO Take 1 capsule by mouth daily.     No current facility-administered medications for this visit.    PHYSICAL EXAMINATION: ECOG PERFORMANCE STATUS: 1 - Symptomatic but completely ambulatory  Vitals:   03/16/22 1451  BP: (!) 131/91  Pulse: 80  Resp: 17  Temp: (!) 97.2 F (36.2 C)  SpO2: 100%   Filed Weights   03/16/22 1451  Weight: 202 lb 4 oz (91.7 kg)    BREAST: No palpable masses or nodules in either right or left breasts. No palpable axillary supraclavicular or infraclavicular adenopathy no breast tenderness or nipple discharge. (exam performed in the presence of a chaperone)  LABORATORY DATA:  I have reviewed the data as listed    Latest Ref Rng & Units 03/16/2022    2:43 PM 07/18/2021   11:43 AM 03/09/2021   12:54 PM  CMP  Glucose 70 - 99 mg/dL 75  87  97   BUN 6 - 20 mg/dL '14  12  8   '$ Creatinine 0.44 - 1.00 mg/dL 0.83  0.80  0.89   Sodium 135 - 145 mmol/L 136  139  140   Potassium 3.5 - 5.1 mmol/L 3.6  3.8  3.7   Chloride 98 - 111 mmol/L 105  108  105   CO2 22 - 32 mmol/L '22  24  29    '$ Calcium 8.9 - 10.3 mg/dL 9.2  9.2  9.1   Total Protein 6.5 - 8.1 g/dL 6.6   6.8   Total Bilirubin 0.3 - 1.2 mg/dL 0.4   0.4   Alkaline Phos 38 - 126 U/L 63   50   AST 15 - 41 U/L 15   19   ALT 0 - 44 U/L 15   27     Lab Results  Component Value Date   WBC 8.1 03/16/2022   HGB 13.1 03/16/2022   HCT 38.9 03/16/2022   MCV 90.0 03/16/2022   PLT 344 03/16/2022   NEUTROABS 5.4 03/16/2022    ASSESSMENT & PLAN:  Malignant neoplasm of upper-outer quadrant of left breast in female, estrogen receptor positive ([place]) 05/12/2021: Right retroareolar biopsy: Negative, 0/3 sentinel lymph nodes negative Right mastectomy: Benign: UDH Left mastectomy: Grade 2 IDC 1.9 cm margins negative, ER 95%, PR 95%, HER2 negative, Ki-67 1% Oncotype DX score 13: 4% risk of distant recurrence Breast reconstruction: Latissimus flap reconstruction (chronic severe pain in the lateral chest wall) currently on gabapentin and Topamax.   Treatment plan: Adjuvant antiestrogen therapy with tamoxifen to start 03/16/2022 at 10 mg daily Previously she had headaches with tamoxifen therefore she discontinued it but we discussed once again and she is willing to try it again.   No role of radiation since she had mastectomies and the tumor size was only 1.9 cm.   Return to clinic by telephone visit in 3 months to discuss tolerance to tamoxifen.    No orders of the defined types were placed in this encounter.  The patient has a good understanding of the overall plan. she agrees with it. she will call with any problems that may develop before the next visit here. Total time spent: 30 mins including face to face time and time spent for planning, charting and co-ordination of care   Harriette Ohara, MD 03/16/22    I Gardiner Coins am acting as a Education administrator for [employer]  I have reviewed the above documentation for accuracy and completeness, and I agree with the above.

## 2022-03-19 ENCOUNTER — Emergency Department (HOSPITAL_BASED_OUTPATIENT_CLINIC_OR_DEPARTMENT_OTHER)
Admission: EM | Admit: 2022-03-19 | Discharge: 2022-03-19 | Disposition: A | Discharge status: Home / Self Care | Payer: No Typology Code available for payment source | Attending: Emergency Medicine | Admitting: Emergency Medicine

## 2022-03-19 ENCOUNTER — Other Ambulatory Visit: Payer: Self-pay

## 2022-03-19 ENCOUNTER — Emergency Department (HOSPITAL_BASED_OUTPATIENT_CLINIC_OR_DEPARTMENT_OTHER): Payer: No Typology Code available for payment source

## 2022-03-19 ENCOUNTER — Encounter (HOSPITAL_BASED_OUTPATIENT_CLINIC_OR_DEPARTMENT_OTHER): Payer: Self-pay | Admitting: Emergency Medicine

## 2022-03-19 DIAGNOSIS — S0101XA Laceration without foreign body of scalp, initial encounter: Secondary | ICD-10-CM | POA: Diagnosis not present

## 2022-03-19 DIAGNOSIS — W010XXA Fall on same level from slipping, tripping and stumbling without subsequent striking against object, initial encounter: Secondary | ICD-10-CM | POA: Insufficient documentation

## 2022-03-19 DIAGNOSIS — S060X0A Concussion without loss of consciousness, initial encounter: Secondary | ICD-10-CM

## 2022-03-19 DIAGNOSIS — S0990XA Unspecified injury of head, initial encounter: Secondary | ICD-10-CM | POA: Diagnosis present

## 2022-03-19 NOTE — ED Notes (Signed)
D/c paperwork reviewed with pt, including follow up care.  No questions or concerns voiced at time of d/c. . Pt verbalized understanding, Ambulatory with family to ED exit, NAD.   

## 2022-03-19 NOTE — ED Provider Notes (Signed)
[facility] Provider Note   CSN: [id number] Arrival date & time: 03/19/22  1152     History {Add pertinent medical, surgical, social history, OB history to HPI:1} Chief Complaint  Patient presents with   Head Injury    Fall     Haley Evans is a 42 y.o. female.  Patient is a 18-year female presenting for blunt head trauma.  Patient states last night she fell backwards outside hitting her head on concrete, fell from standing position, denies LOC or blood thinner use.  Has small laceration to posterior scalp.  Bleeding controlled at this time.  Presenting this morning due to feelings of dizziness, 1 episode of nausea and vomiting, and blurred vision.  Denies any other sensation or motor dysfunction.  Denies recent head injuries.  The history is provided by the patient. No language interpreter was used.  Head Injury Associated symptoms: no seizures and no vomiting        Home Medications Prior to Admission medications   Medication Sig Start Date End Date Taking? Authorizing Provider  acetaminophen (TYLENOL) 500 MG tablet Take 1,000 mg by mouth every 6 (six) hours as needed for moderate pain.    [provider]  ALPRAZolam Duanne Moron) 0.5 MG tablet TAKE 1 TABLET BY MOUTH TWICE DAILY AS NEEDED FOR ANXIETY 03/02/12   Freeman Caldron M, PA-C  B Complex-C (SUPER B COMPLEX PO) Take 1 tablet by mouth daily.    [provider]  Bioflavonoid Products (SUPER C-500 COMPLEX) TBCR Take 1 tablet by mouth daily.    [provider]  BIOTIN PO Take 1 capsule by mouth daily.    [provider]  diazepam (VALIUM) 2 MG tablet Take 2 mg by mouth every 6 (six) hours as needed for anxiety.    [provider]  diclofenac Sodium (VOLTAREN) 1 % GEL Apply 1 Application topically 3 (three) times daily as needed (pain).    [provider]  gabapentin (NEURONTIN) 100 MG capsule Take 3 capsules (300 mg total) by  mouth 3 (three) times daily. 01/10/22   Jennye Boroughs, MD  hydrOXYzine (ATARAX) 50 MG tablet Take 50 mg by mouth at bedtime. 05/29/21   [provider]  ibuprofen (ADVIL) 200 MG tablet Take 200-400 mg by mouth every 8 (eight) hours as needed for moderate pain.    [provider]  Multiple Vitamin (MULTIVITAMIN WITH MINERALS) TABS tablet Take 1 tablet by mouth daily.    [provider]  Omega-3 Fatty Acids (FISH OIL PO) Take 2 capsules by mouth daily.    [provider]  OVER THE COUNTER MEDICATION Take 1 capsule by mouth daily. Beet Root    [provider]  tamoxifen (NOLVADEX) 10 MG tablet Take 1 tablet (10 mg total) by mouth daily. 03/16/22   Nicholas Lose, MD  tizanidine (ZANAFLEX) 2 MG capsule Take 1 capsule (2 mg total) by mouth 3 (three) times daily as needed for muscle spasms. 01/11/22   Jennye Boroughs, MD  topiramate (TOPAMAX) 25 MG tablet SMARTSIG:1 Tablet(s) By Mouth Every Evening 03/15/22   [provider]  TURMERIC PO Take 1 capsule by mouth daily.    [provider]  varenicline (CHANTIX) 1 MG tablet Take 1 mg by mouth daily.    [provider]  VITAMIN E PO Take 1 capsule by mouth daily.    [provider]      Allergies    Morphine    Review of Systems  Review of Systems  Constitutional:  Negative for chills and fever.  HENT:  Negative for ear pain and sore throat.   Eyes:  Negative for pain and visual disturbance.  Respiratory:  Negative for cough and shortness of breath.   Cardiovascular:  Negative for chest pain and palpitations.  Gastrointestinal:  Negative for abdominal pain and vomiting.  Genitourinary:  Negative for dysuria and hematuria.  Musculoskeletal:  Negative for arthralgias and back pain.  Skin:  Positive for wound. Negative for color change and rash.  Neurological:  Negative for seizures and syncope.  All other systems reviewed and are negative.   Physical Exam Updated  Vital Signs BP 119/87   Pulse 90   Temp 98.4 F (36.9 C) (Oral)   Resp 18   Ht '5\' 9"'$  (1.753 m)   Wt 88 kg   LMP 02/21/2022 (Exact Date)   SpO2 99%   BMI 28.65 kg/m  Physical Exam Vitals and nursing note reviewed.  Constitutional:      General: She is not in acute distress.    Appearance: She is well-developed.  HENT:     Head: Normocephalic and atraumatic.      Comments: 2.5 cm laceration, no foreign bodies, no active bleeding, well-approximated, superficial. Eyes:     General: Lids are normal. Vision grossly intact.     Conjunctiva/sclera: Conjunctivae normal.     Pupils: Pupils are equal, round, and reactive to light.     Visual Fields: Right eye visual fields normal and left eye visual fields normal.  Cardiovascular:     Rate and Rhythm: Normal rate and regular rhythm.     Heart sounds: No murmur heard. Pulmonary:     Effort: Pulmonary effort is normal. No respiratory distress.     Breath sounds: Normal breath sounds.  Abdominal:     Palpations: Abdomen is soft.     Tenderness: There is no abdominal tenderness.  Musculoskeletal:        General: No swelling.     Cervical back: Neck supple.  Skin:    General: Skin is warm and dry.     Capillary Refill: Capillary refill takes less than 2 seconds.  Neurological:     Mental Status: She is alert.  Psychiatric:        Mood and Affect: Mood normal.     ED Results / Procedures / Treatments   Labs (all labs ordered are listed, but only abnormal results are displayed) Labs Reviewed - No data to display  EKG None  Radiology No results found.  Procedures Procedures  {Document cardiac monitor, telemetry assessment procedure when appropriate:1}  Medications Ordered in ED Medications - No data to display  ED Course/ Medical Decision Making/ A&P   {   Click here for ABCD2, HEART and other calculatorsREFRESH Note before signing :1}                          Medical Decision Making Amount and/or Complexity of  Data Reviewed Radiology: ordered.   12:41 PM 42-year female presenting for blunt head trauma.   {Document critical care time when appropriate:1} {Document review of labs and clinical decision tools ie heart score, Chads2Vasc2 etc:1}  {Document your independent review of radiology images, and any outside records:1} {Document your discussion with family members, caretakers, and with consultants:1} {Document social determinants of health affecting pt's care:1} {Document your decision making why or why not admission, treatments were needed:1} Final Clinical Impression(s) / ED Diagnoses  Final diagnoses:  None    Rx / DC Orders ED Discharge Orders     None

## 2022-03-19 NOTE — ED Triage Notes (Signed)
Fell last night  , tripped  fell backward head  injury occipital laceration , bleeding controlled . No loc . Feels dizzy and vomited once this morning . Blurry vision she said . Alert and oriented x 4 , ambulatory with steady gait .

## 2022-03-19 NOTE — Discharge Instructions (Addendum)
Please follow discharge instructions on concussions.  Follow-up with primary care physician in the next 3 to 5 days if symptoms not begin to improve.  Return to emergency department for repeat head injury.

## 2022-03-22 ENCOUNTER — Encounter: Payer: Self-pay | Admitting: Physical Medicine & Rehabilitation

## 2022-03-27 ENCOUNTER — Encounter: Payer: Self-pay | Admitting: Physical Medicine & Rehabilitation

## 2022-03-27 ENCOUNTER — Encounter
Payer: No Typology Code available for payment source | Attending: Physical Medicine & Rehabilitation | Admitting: Physical Medicine & Rehabilitation

## 2022-03-27 VITALS — BP 130/88 | HR 95 | Ht 69.0 in | Wt 193.0 lb

## 2022-03-27 DIAGNOSIS — G894 Chronic pain syndrome: Secondary | ICD-10-CM | POA: Diagnosis present

## 2022-03-27 DIAGNOSIS — Z79891 Long term (current) use of opiate analgesic: Secondary | ICD-10-CM | POA: Insufficient documentation

## 2022-03-27 DIAGNOSIS — M792 Neuralgia and neuritis, unspecified: Secondary | ICD-10-CM

## 2022-03-27 DIAGNOSIS — Z5181 Encounter for therapeutic drug level monitoring: Secondary | ICD-10-CM | POA: Diagnosis present

## 2022-03-27 DIAGNOSIS — G8918 Other acute postprocedural pain: Secondary | ICD-10-CM

## 2022-03-27 DIAGNOSIS — F319 Bipolar disorder, unspecified: Secondary | ICD-10-CM | POA: Diagnosis present

## 2022-03-27 MED ORDER — TIZANIDINE HCL 2 MG PO CAPS: 2.0000 mg | ORAL_CAPSULE | Freq: Three times a day (TID) | ORAL | 0 refills | Status: DC | PRN

## 2022-03-27 MED ORDER — TRAMADOL HCL 50 MG PO TABS: 50.0000 mg | ORAL_TABLET | Freq: Three times a day (TID) | ORAL | 0 refills | Status: DC | PRN

## 2022-03-27 NOTE — Progress Notes (Addendum)
Subjective:    Patient ID: Haley Evans, female    DOB: 11/19/1980, 42 y.o.   MRN: [id number]  HPI  HPI Haley Evans is a 42 year old female with past medical history of bipolar disorder, breast cancer here for chronic pain.  Patient had mastectomy 05/12/2021.  She then had left breast reconstruction with placement of tissue expander 06/15/2021.  On 07/18/2021 she had another surgery after having poor wound healing and had left breast reconstruction with latissimus flap and tissue expander by Dr. Marla Roe.  Patient reports that she has had worsening pain since her surgeries.  Pain has has most severe after her third surgery. She has had worsening pain over her left chest back and shoulder.  She has burning and tingling pain over her back at the site of the latissimus flap.  She has dull aching pain around her scapula.  She also has aching in shooting pain around her chest.  She has tried Voltaren gel and Flexeril without much benefit.  She is working with physical therapy and completing dry needling with improvement to her pain.  She has also tried gabapentin 100 mg 3 times daily but reports it makes her feel foggy.  It does provide some benefit to the pain. She also did not take the medication regularly.  She also tried Tylenol and Advil with mild to moderate improvement in her pain when she takes them. She has a history of bipolar disorder. She reports she is not on any medications at this time although she feels her bipolar disorder has been stable recently.       Visit 11/21/2021 Haley Evans is here for follow-up regarding her chronic postmastectomy pain. She had had removal and replacement of L breast tissue expander 10/27/21 by Dr. Marla Roe.  She reports that her pain has significantly improved since this time.  She no longer has the burning and tingling pain over her chest back and shoulder.  She still has some aching pain in her left lateral back.  She is taking turmeric with benefit.   She also sometimes takes Tylenol (less than 3g daily) or ibuprofen.  She was restarted gabapentin as patient reports she did not tolerate Lyrica very well and felt like it was affecting her mood.  She does not take the gabapentin regularly and will take 100 to 300 mg with each dose.  She is not having significant sedation with this medication.  She reports that her prior concerns of sedation with gabapentin were possibly related to other medications she was taking at the time.  Patient reports that plastic surgery has reordered her physical therapy and she plans to start this soon.   Interval history 02/07/2022 Haley Evans is here for follow-up of her chronic mastectomy related pain.  On 11 3 she had a repeat surgery with removal of left chest tissue expander.  Pain was initially worsened after this procedure and she required a few tablets of oxycodone for acute pain control.  She still has 3 tablets left.  She reports she may have an additional surgery in about 3 months.  She is back in physical therapy and seeing them once a week.  They are working on dry needling and desensitization.  She reports her surgical incisions are healed.  She continues to have flareups of pain that last a few days.  She uses Tylenol and ibuprofen to control this pain.  Gabapentin 200 mg 3 times a day helps to control the neuropathic pain component.  She will sometimes take  300 mg if the pain is worse.  The gabapentin does make her more hungry.  She also takes tizanidine which also helps her pain.  She would like to avoid any antidepressant medications. Overall she feels that her pain is fairly well-controlled and would not like to make any medication changes at this time.   Interval history Haley Evans is here for chronic mastectomy related pain.  She recently had a fall where she was seen at the ER after she tripped and fell backwards resulting in a occipital laceration.  She continues to have severe pain around her lats,  chest, neck and shoulder.  She feels like the pain is significantly limiting her activities.  She does not want to do anything or go anywhere due to the pain.  Pain was not improved with Tylenol, Voltaren gel, Lyrica.  She would like to avoid antidepressant medications due to her bipolar history.  She is not using CBD Gummies at this time.  She does use Valium occasionally but would consider switching back to hydroxyzine.    Pain Inventory Average Pain 7 Pain Right Now 7 My pain is sharp, dull, stabbing, tingling, and aching  In the last 24 hours, has pain interfered with the following? General activity 9 Relation with others 8 Enjoyment of life 9 What TIME of day is your pain at its worst? 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  Tobacco Use   Smoking status: Former    Years: 20.00    Types: Cigarettes    Quit date: 04/12/2021    Years since quitting: 0.9   Smokeless tobacco: Never  Vaping Use   Vaping Use: Never used  Substance and Sexual Activity   Alcohol use: Not Currently    Alcohol/week: 5.0 standard drinks of alcohol    Types: 5 Standard drinks or equivalent per week   Drug use: No   Sexual activity: Not on file    Comment: [place]pper 10 yr IUD  Other Topics Concern   Not on file  Social History Narrative   Not on file   Social Determinants of Health   Financial Resource Strain: Not on file  Food  Insecurity: Not on file  Transportation Needs: Not on file  Physical Activity: Not on file  Stress: Not on file  Social Connections: Not on file   Past Surgical History:  Procedure Laterality Date   ARM WOUND REPAIR / CLOSURE Right    fractured arm   BREAST IMPLANT REMOVAL Left 12/30/2021   Procedure: REMOVAL LEFT CHEST TISSUE EXPANDER;  Surgeon: Irene Limbo, MD;  Location: [facility];  Service: Plastics;  Laterality: Left;   BREAST RECONSTRUCTION WITH PLACEMENT OF TISSUE EXPANDER AND FLEX HD (ACELLULAR HYDRATED DERMIS) Left 06/15/2021   Procedure: REPLACEMENT OF TISSUE EXPANDER AND FLEX HD (ACELLULAR HYDRATED DERMIS);  Surgeon: Wallace Going, DO;  Location: [facility];  Service: Plastics;  Laterality:  Left;   CESAREAN SECTION     DEBRIDEMENT AND CLOSURE WOUND Left 06/15/2021   Procedure: DEBRIDEMENT AND CLOSURE LEFT BREAST WOUND;  Surgeon: Wallace Going, DO;  Location: [facility];  Service: Plastics;  Laterality: Left;  1 hour   LATISSIMUS FLAP TO BREAST Left 07/18/2021   Procedure: LATISSIMUS FLAP TO BREAST;  Surgeon: Wallace Going, DO;  Location: [facility];  Service: Plastics;  Laterality: Left;   NIPPLE SPARING MASTECTOMY Right 05/12/2021   Procedure: RIGHT NIPPLE SPARING MASTECTOMY;  Surgeon: Jovita Kussmaul, MD;  Location: [facility];  Service: General;  Laterality: Right;   NIPPLE SPARING MASTECTOMY WITH SENTINEL LYMPH NODE BIOPSY Left 05/12/2021   Procedure: LEFT NIPPLE SPARING MASTECTOMY WITH LEFT SENTINEL LYMPH NODE BIOPSY;  Surgeon: Jovita Kussmaul, MD;  Location: [facility];  Service: General;  Laterality: Left;   PRE-PECTORAL BREAST RECON W/ PLACEMENT OF TISSUE EXPANDERS, DERMAL GRAFT Bilateral 05/12/2021   Procedure: IMMEDIATE BILATERAL BREAST RECONSTRUCTION WITH PLACEMENT OF TISSUE EXPANDERS AND FLEX HD;  Surgeon: Wallace Going, DO;  Location: [facility];  Service: Plastics;  Laterality: Bilateral;    REMOVAL OF TISSUE EXPANDER Left 10/27/2021   Procedure: Left breast seroma drainage with tissue expander exchange;  Surgeon: Wallace Going, DO;  Location: [facility];  Service: Plastics;  Laterality: Left;   TISSUE EXPANDER PLACEMENT Left 07/18/2021   Procedure: REMOVAL AND REPLACEMENT OF TISSUE EXPANDER;  Surgeon: Wallace Going, DO;  Location: [facility];  Service: Plastics;  Laterality: Left;  3 hours   Past Surgical History:  Procedure Laterality Date   ARM WOUND REPAIR / CLOSURE Right    fractured arm   BREAST IMPLANT REMOVAL Left 12/30/2021   Procedure: REMOVAL LEFT CHEST TISSUE EXPANDER;  Surgeon: Irene Limbo, MD;  Location: [facility];  Service: Plastics;  Laterality: Left;   BREAST RECONSTRUCTION WITH PLACEMENT OF TISSUE EXPANDER AND FLEX HD (ACELLULAR HYDRATED DERMIS) Left 06/15/2021   Procedure: REPLACEMENT OF TISSUE EXPANDER AND FLEX HD (ACELLULAR HYDRATED DERMIS);  Surgeon: Wallace Going, DO;  Location: [facility];  Service: Plastics;  Laterality: Left;   CESAREAN SECTION     DEBRIDEMENT AND CLOSURE WOUND Left 06/15/2021   Procedure: DEBRIDEMENT AND CLOSURE LEFT BREAST WOUND;  Surgeon: Wallace Going, DO;  Location: [facility];  Service: Plastics;  Laterality: Left;  1 hour   LATISSIMUS FLAP TO BREAST Left 07/18/2021   Procedure: LATISSIMUS FLAP TO BREAST;  Surgeon: Wallace Going, DO;  Location: [facility];  Service: Plastics;  Laterality: Left;   NIPPLE SPARING MASTECTOMY Right 05/12/2021   Procedure: RIGHT NIPPLE SPARING MASTECTOMY;  Surgeon: Jovita Kussmaul, MD;  Location: [facility];  Service: General;  Laterality: Right;   NIPPLE SPARING MASTECTOMY WITH SENTINEL LYMPH NODE BIOPSY Left 05/12/2021   Procedure: LEFT NIPPLE SPARING MASTECTOMY WITH LEFT SENTINEL LYMPH NODE BIOPSY;  Surgeon: Jovita Kussmaul, MD;  Location: [facility];  Service: General;  Laterality: Left;   PRE-PECTORAL BREAST RECON W/ PLACEMENT OF TISSUE EXPANDERS,  DERMAL GRAFT Bilateral 05/12/2021   Procedure: IMMEDIATE BILATERAL BREAST RECONSTRUCTION WITH PLACEMENT OF TISSUE EXPANDERS AND FLEX HD;  Surgeon: Wallace Going, DO;  Location: [facility];  Service: Plastics;  Laterality: Bilateral;   REMOVAL OF TISSUE EXPANDER Left 10/27/2021   Procedure: Left breast seroma drainage with tissue expander exchange;  Surgeon: Wallace Going, DO;  Location: [facility];  Service: Plastics;  Laterality: Left;  TISSUE EXPANDER PLACEMENT Left 07/18/2021   Procedure: REMOVAL AND REPLACEMENT OF TISSUE EXPANDER;  Surgeon: Wallace Going, DO;  Location: [facility];  Service: Plastics;  Laterality: Left;  3 hours   Past Medical History:  Diagnosis Date   Anemia    Anxiety    Bipolar 2 disorder ([facility])    Cancer ([facility])    breast   Depression    Family history of adverse reaction to anesthesia    aunt woke up during hip replacement   Seizure ([facility])    vs syncopy, age 78 and 12/2017, Seen by neurologist, a definitive diagnosis was not made. Pt reports having seizure aroudn 2021   BP 130/88   Pulse 95   Ht '5\' 9"'$  (1.753 m)   Wt 193 lb (87.5 kg)   LMP 02/21/2022 (Exact Date)   SpO2 98%   BMI 28.50 kg/m   Opioid Risk Score:   Fall Risk Score:  `1  Depression screen [facility] 2/9     02/07/2022   11:44 AM 11/21/2021    2:02 PM 09/29/2021    1:46 PM  Depression screen PHQ 2/9  Decreased Interest 0 0 1  Down, Depressed, Hopeless 0 0 0  PHQ - 2 Score 0 0 1  Altered sleeping   1  Tired, decreased energy   2  Change in appetite   2  Feeling bad or failure about yourself    0  Trouble concentrating   1  Moving slowly or fidgety/restless   0  Suicidal thoughts   0  PHQ-9 Score   7  Difficult doing work/chores   Somewhat difficult      Review of Systems  Musculoskeletal:  Positive for back pain and neck pain.  All other systems reviewed and are negative.     Objective:   Physical Exam  Gen: no distress, normal appearing HEENT: oral mucosa  pink and moist, NCAT Chest: normal effort, normal rate of breathing Abd: soft, non-distended Ext: no edema Psych: pleasant, normal affect Skin: intact Neuro: Alert , , follows commands, cranial nerves II through XII grossly intact, sensation intact to light touch in all 4 extremities Strength 5 out of 5 in all 4 extremities Musculoskeletal: Moderate tenderness noted over left anterior chest, upper arm S/p latissimus flap surgery on the left, tenderness in over lateral left posterior back Active abduction of the left shoulder to about 90 degrees No tenderness noted around R shoulder or back      Assessment & Plan:  Post mastectomy and reconstruction pain, gradually improving.  She had removal of eft chest tissue expander on the left on 12/30/21. -Continue gabapentin to 200-'300mg'$  TID, dose limited by sedation -Continue physical therapy -Continue tizanidine PRN medication refilled Continue ibuprofen and tylenol PRN -Continue tumaric  -Order tramadol 50 mg every 8 hours as needed for 5 days for pain control -UDS and opiate agreement to be completed today, can consider continuing tramadol if this is helping pending results of UDS -Discussed increased risks of using opioid medications while using diazepam, she says she will consider trying hydroxyzine again because this worked well in the past also  Hx of bipolar disorder not on medications -She reports her mood has been stable.  -She would like to avoid use of antidepressant medications -She does use diazepam for however she says she may consider switching to hydroxyzine  03/31/22 called her back, tramadol not helping, will check to percocet 5 q12h PRN, hydrocodone provided inadequate control in the past

## 2022-03-30 ENCOUNTER — Encounter: Payer: Self-pay | Admitting: Physical Medicine & Rehabilitation

## 2022-03-30 LAB — TOXASSURE SELECT,+ANTIDEPR,UR

## 2022-03-31 ENCOUNTER — Telehealth: Payer: Self-pay | Admitting: Physical Medicine & Rehabilitation

## 2022-03-31 MED ORDER — OXYCODONE-ACETAMINOPHEN 5-325 MG PO TABS: 1.0000 | ORAL_TABLET | Freq: Two times a day (BID) | ORAL | 0 refills | Status: DC | PRN

## 2022-03-31 NOTE — Telephone Encounter (Signed)
Patient called in and left a voicemail at 2:46pm , patient states medication is not working and is asking if there is a different type of medication she could try.

## 2022-03-31 NOTE — Addendum Note (Signed)
Addended by: Jennye Boroughs on: 03/31/2022 04:54 PM   Modules accepted: Orders

## 2022-05-01 ENCOUNTER — Ambulatory Visit: Payer: BC Managed Care – PPO | Admitting: Physical Medicine & Rehabilitation

## 2022-05-03 ENCOUNTER — Telehealth: Payer: Self-pay | Admitting: *Deleted

## 2022-05-03 NOTE — Telephone Encounter (Addendum)
Urine drug screen for this encounter is consistent for prescribed medication. ETG without ETS is likely glucose fermentation in the blood rather than alcohol related.

## 2022-05-04 ENCOUNTER — Encounter
Payer: No Typology Code available for payment source | Attending: Physical Medicine & Rehabilitation | Admitting: Physical Medicine & Rehabilitation

## 2022-05-04 ENCOUNTER — Encounter: Payer: Self-pay | Admitting: Physical Medicine & Rehabilitation

## 2022-05-04 VITALS — BP 124/86 | HR 80 | Ht 69.0 in | Wt 185.8 lb

## 2022-05-04 DIAGNOSIS — G894 Chronic pain syndrome: Secondary | ICD-10-CM | POA: Insufficient documentation

## 2022-05-04 DIAGNOSIS — F319 Bipolar disorder, unspecified: Secondary | ICD-10-CM | POA: Diagnosis not present

## 2022-05-04 DIAGNOSIS — G8918 Other acute postprocedural pain: Secondary | ICD-10-CM | POA: Insufficient documentation

## 2022-05-04 MED ORDER — OXYCODONE-ACETAMINOPHEN 5-325 MG PO TABS: 1.0000 | ORAL_TABLET | Freq: Two times a day (BID) | ORAL | 0 refills | Status: DC | PRN

## 2022-05-04 NOTE — Progress Notes (Signed)
Subjective:    Patient ID: Haley Evans, female    DOB: 11/19/1980, 42 y.o.   MRN: [id number]  HPI  HPI Haley Evans is a 42 year old female with past medical history of bipolar disorder, breast cancer here for chronic pain.  Patient had mastectomy 05/12/2021.  She then had left breast reconstruction with placement of tissue expander 06/15/2021.  On 07/18/2021 she had another surgery after having poor wound healing and had left breast reconstruction with latissimus flap and tissue expander by Dr. Marla Roe.  Patient reports that she has had worsening pain since her surgeries.  Pain has has most severe after her third surgery. She has had worsening pain over her left chest back and shoulder.  She has burning and tingling pain over her back at the site of the latissimus flap.  She has dull aching pain around her scapula.  She also has aching in shooting pain around her chest.  She has tried Voltaren gel and Flexeril without much benefit.  She is working with physical therapy and completing dry needling with improvement to her pain.  She has also tried gabapentin 100 mg 3 times daily but reports it makes her feel foggy.  It does provide some benefit to the pain. She also did not take the medication regularly.  She also tried Tylenol and Advil with mild to moderate improvement in her pain when she takes them. She has a history of bipolar disorder. She reports she is not on any medications at this time although she feels her bipolar disorder has been stable recently.       Visit 11/21/2021 Haley. Evans is here for follow-up regarding her chronic postmastectomy pain. She had had removal and replacement of L breast tissue expander 10/27/21 by Dr. Marla Roe.  She reports that her pain has significantly improved since this time.  She no longer has the burning and tingling pain over her chest back and shoulder.  She still has some aching pain in her left lateral back.  She is taking turmeric with benefit.   She also sometimes takes Tylenol (less than 3g daily) or ibuprofen.  She was restarted gabapentin as patient reports she did not tolerate Lyrica very well and felt like it was affecting her mood.  She does not take the gabapentin regularly and will take 100 to 300 mg with each dose.  She is not having significant sedation with this medication.  She reports that her prior concerns of sedation with gabapentin were possibly related to other medications she was taking at the time.  Patient reports that plastic surgery has reordered her physical therapy and she plans to start this soon.   Interval history 02/07/2022 Haley. Evans is here for follow-up of her chronic mastectomy related pain.  On 11 3 she had a repeat surgery with removal of left chest tissue expander.  Pain was initially worsened after this procedure and she required a few tablets of oxycodone for acute pain control.  She still has 3 tablets left.  She reports she may have an additional surgery in about 3 months.  She is back in physical therapy and seeing them once a week.  They are working on dry needling and desensitization.  She reports her surgical incisions are healed.  She continues to have flareups of pain that last a few days.  She uses Tylenol and ibuprofen to control this pain.  Gabapentin 200 mg 3 times a day helps to control the neuropathic pain component.  She will sometimes take  300 mg if the pain is worse.  The gabapentin does make her more hungry.  She also takes tizanidine which also helps her pain.  She would like to avoid any antidepressant medications. Overall she feels that her pain is fairly well-controlled and would not like to make any medication changes at this time.   Interval history 03/17/12 Haley. Savaggio is here for chronic mastectomy related pain.  She recently had a fall where she was seen at the ER after she tripped and fell backwards resulting in a occipital laceration.  She continues to have severe pain around her  lats, chest, neck and shoulder.  She feels like the pain is significantly limiting her activities.  She does not want to do anything or go anywhere due to the pain.  Pain was not improved with Tylenol, Voltaren gel, Lyrica.  She would like to avoid antidepressant medications due to her bipolar history.  She is not using CBD Gummies at this time.  She does use Valium occasionally but would consider switching back to hydroxyzine.   Interval History 05/04/22 Haley Evans is here regarding her chronic chest wall and back pain.  She reports pain is much better controlled with oxycodone.  The medication has lasted her over a month although she is almost out of the medication.  She is not having any side effects.  She reports her friends and family have noticed that she is more active and she is able to participate better in social activities.  She also uses gabapentin that provides benefit.  She tries to use Tylenol or ibuprofen when pain is less severe and only uses oxycodone when pain is really bad.  Sometimes 5 mg oxycodone makes the pain tolerable, other times it is not strong enough and she will have poorly controlled pain.  Pain Inventory Average Pain 7 Pain Right Now 6 My pain is constant, sharp, and aching  In the last 24 hours, has pain interfered with the following? General activity 4 Relation with others 2 Enjoyment of life 2 What TIME of day is your pain at its worst? 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  Tobacco Use   Smoking status: Former    Years: 20.00    Types: Cigarettes    Quit date: 04/12/2021    Years since quitting: 1.0   Smokeless tobacco: Never  Vaping Use   Vaping Use: Never used  Substance and Sexual Activity   Alcohol use: Not Currently    Alcohol/week: 5.0 standard drinks of alcohol    Types: 5 Standard drinks or equivalent per week   Drug use: No   Sexual activity: Not on file    Comment: Copper 10 yr IUD  Other Topics Concern   Not on file  Social History Narrative   Not on file   Social Determinants of Health   Financial Resource Strain: Not on  file  Food Insecurity: Not on file  Transportation Needs: Not on file  Physical Activity: Not on file  Stress: Not on file  Social Connections: Not on file   Past Surgical History:  Procedure Laterality Date   ARM WOUND REPAIR / CLOSURE Right    fractured arm   BREAST IMPLANT REMOVAL Left 12/30/2021   Procedure: REMOVAL LEFT CHEST TISSUE EXPANDER;  Surgeon: Irene Limbo, MD;  Location: [facility];  Service: Plastics;  Laterality: Left;   BREAST RECONSTRUCTION WITH PLACEMENT OF TISSUE EXPANDER AND FLEX HD (ACELLULAR HYDRATED DERMIS) Left 06/15/2021   Procedure: REPLACEMENT OF TISSUE EXPANDER AND FLEX HD (ACELLULAR HYDRATED DERMIS);  Surgeon: Wallace Going, DO;  Location: [place];  Service: Plastics;  Laterality: Left;   CESAREAN SECTION     DEBRIDEMENT AND CLOSURE WOUND Left 06/15/2021   Procedure: DEBRIDEMENT AND CLOSURE LEFT BREAST WOUND;  Surgeon: Wallace Going, DO;  Location: [facility] [place];  Service: Plastics;  Laterality: Left;  1 hour   LATISSIMUS FLAP TO BREAST Left 07/18/2021   Procedure: LATISSIMUS FLAP TO BREAST;  Surgeon: Wallace Going, DO;  Location: [place];  Service: Plastics;  Laterality: Left;   NIPPLE SPARING MASTECTOMY Right 05/12/2021   Procedure: RIGHT  NIPPLE SPARING MASTECTOMY;  Surgeon: Jovita Kussmaul, MD;  Location: [place];  Service: General;  Laterality: Right;   NIPPLE SPARING MASTECTOMY WITH SENTINEL LYMPH NODE BIOPSY Left 05/12/2021   Procedure: LEFT NIPPLE SPARING MASTECTOMY WITH LEFT SENTINEL LYMPH NODE BIOPSY;  Surgeon: Jovita Kussmaul, MD;  Location: [place];  Service: General;  Laterality: Left;   PRE-PECTORAL BREAST RECON W/ PLACEMENT OF TISSUE EXPANDERS, DERMAL GRAFT Bilateral 05/12/2021   Procedure: IMMEDIATE BILATERAL BREAST RECONSTRUCTION WITH PLACEMENT OF TISSUE EXPANDERS AND FLEX HD;  Surgeon: Wallace Going, DO;  Location: [place];  Service: Plastics;  Laterality: Bilateral;   REMOVAL OF TISSUE EXPANDER Left 10/27/2021   Procedure: Left breast seroma drainage with tissue expander exchange;  Surgeon: Wallace Going, DO;  Location: [place];  Service: Plastics;  Laterality: Left;   TISSUE EXPANDER PLACEMENT Left 07/18/2021   Procedure: REMOVAL AND REPLACEMENT OF TISSUE EXPANDER;  Surgeon: Wallace Going, DO;  Location: [facility];  Service: Plastics;  Laterality: Left;  3 hours   Past Surgical History:  Procedure Laterality Date   ARM WOUND REPAIR / CLOSURE Right    fractured arm   BREAST IMPLANT REMOVAL Left 12/30/2021   Procedure: REMOVAL LEFT CHEST TISSUE EXPANDER;  Surgeon: Irene Limbo, MD;  Location: [place];  Service: Plastics;  Laterality: Left;   BREAST RECONSTRUCTION WITH PLACEMENT OF TISSUE EXPANDER AND FLEX HD (ACELLULAR HYDRATED DERMIS) Left 06/15/2021   Procedure: REPLACEMENT OF TISSUE EXPANDER AND FLEX HD (ACELLULAR HYDRATED DERMIS);  Surgeon: Wallace Going, DO;  Location: [place];  Service: Plastics;  Laterality: Left;   CESAREAN SECTION     DEBRIDEMENT AND CLOSURE WOUND Left 06/15/2021   Procedure: DEBRIDEMENT AND CLOSURE LEFT BREAST WOUND;  Surgeon: Wallace Going, DO;  Location: [place] [facility];  Service: Plastics;  Laterality:  Left;  1 hour   LATISSIMUS FLAP TO BREAST Left 07/18/2021   Procedure: LATISSIMUS FLAP TO BREAST;  Surgeon: Wallace Going, DO;  Location: [place];  Service: Plastics;  Laterality: Left;   NIPPLE SPARING MASTECTOMY Right 05/12/2021   Procedure: RIGHT NIPPLE SPARING MASTECTOMY;  Surgeon: Jovita Kussmaul, MD;  Location: [place];  Service: General;  Laterality: Right;   NIPPLE SPARING MASTECTOMY WITH SENTINEL LYMPH NODE BIOPSY Left 05/12/2021   Procedure: LEFT NIPPLE SPARING MASTECTOMY WITH LEFT SENTINEL LYMPH NODE BIOPSY;  Surgeon: Jovita Kussmaul, MD;  Location: [place];  Service: General;  Laterality: Left;   PRE-PECTORAL BREAST RECON W/ PLACEMENT OF TISSUE EXPANDERS, DERMAL GRAFT Bilateral 05/12/2021   Procedure: IMMEDIATE BILATERAL BREAST RECONSTRUCTION WITH PLACEMENT OF TISSUE EXPANDERS AND FLEX HD;  Surgeon: Wallace Going, DO;  Location: [place];  Service: Plastics;  Laterality: Bilateral;   REMOVAL OF TISSUE EXPANDER Left 10/27/2021   Procedure: Left breast seroma drainage with tissue expander exchange;  Surgeon: Wallace Going, DO;  Location: [place];  Service: Plastics;  Laterality: Left;   TISSUE EXPANDER PLACEMENT Left 07/18/2021   Procedure: REMOVAL AND REPLACEMENT OF TISSUE EXPANDER;  Surgeon: Wallace Going, DO;  Location: [place];  Service: Plastics;  Laterality: Left;  3 hours   Past Medical History:  Diagnosis Date   Anemia    Anxiety    Bipolar 2 disorder ([place])    Cancer ([facility])    breast   Depression    Family history of adverse reaction to anesthesia    aunt woke up during hip replacement   Seizure ([place])    vs syncopy, age 59 and 12/2017, Seen by neurologist, a definitive diagnosis was not made. Pt reports having seizure aroudn 2021   BP 124/86   Pulse 80   Ht '5\' 9"'$  (1.753 m)   Wt 185 lb 12.8 oz (84.3 kg)   SpO2 95%   BMI 27.44 kg/m   Opioid Risk Score:   Fall Risk Score:  `1  Depression screen [facility]  2/9     02/07/2022   11:44 AM 11/21/2021    2:02 PM 09/29/2021    1:46 PM  Depression screen PHQ 2/9  Decreased Interest 0 0 1  Down, Depressed, Hopeless 0 0 0  PHQ - 2 Score 0 0 1  Altered sleeping   1  Tired, decreased energy   2  Change in appetite   2  Feeling bad or failure about yourself    0  Trouble concentrating   1  Moving slowly or fidgety/restless   0  Suicidal thoughts   0  PHQ-9 Score   7  Difficult doing work/chores   Somewhat difficult      Review of Systems  Musculoskeletal:  Positive for back pain.  All other systems reviewed and are negative.     Objective:   Physical Exam  Gen: no distress, normal appearing HEENT: oral mucosa pink and moist, NCAT Chest: normal effort, normal rate of breathing Abd: soft, non-distended Ext: no edema Psych: pleasant, normal affect Skin: intact Neuro: Alert , , follows commands, cranial nerves II through XII grossly intact, sensation intact to light touch in all 4 extremities Strength 5 out of 5 in all 4 extremities Musculoskeletal: Moderate tenderness noted over left anterior chest, upper arm S/p latissimus flap surgery on the left, tenderness in over lateral left posterior back Active abduction of the left shoulder to about 90 degrees Pain with L arm adduction against resistance No tenderness noted around R shoulder or back        Assessment & Plan:   Post mastectomy and reconstruction pain, gradually improving.  She had removal of eft chest tissue expander on the left on 12/30/21. -Continue gabapentin to 200-'300mg'$  TID, dose limited by sedation -Continue physical therapy -Continue tizanidine PRN - she  reports pain benefit Continue ibuprofen and tylenol PRN- discussed no more than '3000mg'$  tylenol from all sources a day -Continue tumaric  -Continue oxycodone '5mg'$ , Adjust to 1-1.5 tabs up to Q12h PRN, 60 tabs ordered -Discussed increased risks of using opioid medications while using diazepam- last visit -Advised to  bring pain medications to next visit -PDMP reviewed   Hx of bipolar disorder not on medications -She reports her mood has been stable.  -She would like to avoid use of antidepressant medications -She does use diazepam for however she says she may consider switching to hydroxyzine at a later time

## 2022-05-19 ENCOUNTER — Encounter: Payer: Self-pay | Admitting: Physical Medicine & Rehabilitation

## 2022-05-30 MED ORDER — OXYCODONE-ACETAMINOPHEN 5-325 MG PO TABS: 1.0000 | ORAL_TABLET | Freq: Two times a day (BID) | ORAL | 0 refills | Status: DC | PRN

## 2022-06-07 NOTE — Progress Notes (Signed)
Patient Care Team: Iona Hansen, NP as PCP - General (Nurse Practitioner) Pershing Proud, RN as Oncology Nurse Navigator Donnelly Angelica, RN as Oncology Nurse Navigator Griselda Miner, MD as Consulting Physician (General Surgery) Serena Croissant, MD as Consulting Physician (Hematology and Oncology) Antony Blackbird, MD as Consulting Physician (Radiation Oncology)  DIAGNOSIS: No diagnosis found.  SUMMARY OF ONCOLOGIC HISTORY: Oncology History  Malignant neoplasm of upper-outer quadrant of left breast in female, estrogen receptor positive  03/01/2021 Initial Diagnosis   Screening mammogram detected left breast mass posteriorly at the axillary tail measuring 1.5 cm: Biopsy: Grade 2 IDC ER 95%, PR 95%, HER2 negative, Ki-67 1%, left breast calcifications 0.8 cm biopsy: Benign, left breast asymmetry?  Axilla negative   03/09/2021 Cancer Staging   Staging form: Breast, AJCC 8th Edition - Clinical stage from 03/09/2021: Stage IA (cT1c, cN0, cM0, G2, ER+, PR+, HER2-) - Signed by Serena Croissant, MD on 03/09/2021 Stage prefix: Initial diagnosis Histologic grading system: 3 grade system    Genetic Testing   [facility] CancerNext-Expanded Panel is Negative. Report date was 03/21/2021.  The CancerNext-Expanded gene panel offered by [facility] and includes sequencing, rearrangement, and RNA analysis for the following 77 genes: AIP, ALK, APC, ATM, AXIN2, BAP1, BARD1, BLM, BMPR1A, BRCA1, BRCA2, BRIP1, CDC73, CDH1, CDK4, CDKN1B, CDKN2A, CHEK2, CTNNA1, DICER1, FANCC, FH, FLCN, GALNT12, KIF1B, LZTR1, MAX, MEN1, MET, MLH1, MSH2, MSH3, MSH6, MUTYH, NBN, NF1, NF2, NTHL1, PALB2, PHOX2B, PMS2, POT1, PRKAR1A, PTCH1, PTEN, RAD51C, RAD51D, RB1, RECQL, RET, SDHA, SDHAF2, SDHB, SDHC, SDHD, SMAD4, SMARCA4, SMARCB1, SMARCE1, STK11, SUFU, TMEM127, TP53, TSC1, TSC2, VHL and XRCC2 (sequencing and deletion/duplication); EGFR, EGLN1, HOXB13, KIT, MITF, PDGFRA, POLD1, and POLE (sequencing only); EPCAM and GREM1 (deletion/duplication  only).    04/12/2021 Oncotype testing   Oncotype DX score 13: 4% risk of recurrence   05/12/2021 Surgery   05/12/2021: Right retroareolar biopsy: Negative, 0/3 sentinel lymph nodes negative Right mastectomy: Benign: UDH Left mastectomy: Grade 2 IDC 1.9 cm margins negative, ER 95%, PR 95%, HER2 negative, Ki-67 1%     CHIEF COMPLIANT:   INTERVAL HISTORY: Haley Evans is a   ALLERGIES:  is allergic to morphine.  MEDICATIONS:  Current Outpatient Medications  Medication Sig Dispense Refill   acetaminophen (TYLENOL) 500 MG tablet Take 1,000 mg by mouth every 6 (six) hours as needed for moderate pain.     ALPRAZolam (XANAX) 0.5 MG tablet TAKE 1 TABLET BY MOUTH TWICE DAILY AS NEEDED FOR ANXIETY 10 tablet 0   B Complex-C (SUPER B COMPLEX PO) Take 1 tablet by mouth daily.     Bioflavonoid Products (SUPER C-500 COMPLEX) TBCR Take 1 tablet by mouth daily.     BIOTIN PO Take 1 capsule by mouth daily.     diazepam (VALIUM) 2 MG tablet Take 2 mg by mouth every 6 (six) hours as needed for anxiety.     diclofenac Sodium (VOLTAREN) 1 % GEL Apply 1 Application topically 3 (three) times daily as needed (pain).     gabapentin (NEURONTIN) 100 MG capsule Take 3 capsules (300 mg total) by mouth 3 (three) times daily. 270 capsule 3   hydrOXYzine (ATARAX) 50 MG tablet Take 50 mg by mouth at bedtime.     ibuprofen (ADVIL) 200 MG tablet Take 200-400 mg by mouth every 8 (eight) hours as needed for moderate pain.     Multiple Vitamin (MULTIVITAMIN WITH MINERALS) TABS tablet Take 1 tablet by mouth daily.     Omega-3 Fatty Acids (FISH OIL PO)  Take 2 capsules by mouth daily.     OVER THE COUNTER MEDICATION Take 1 capsule by mouth daily. Beet Root     oxyCODONE-acetaminophen (PERCOCET) 5-325 MG tablet Take 1-1.5 tablets by mouth every 12 (twelve) hours as needed for severe pain. 90 tablet 0   tamoxifen (NOLVADEX) 10 MG tablet Take 1 tablet (10 mg total) by mouth daily. 90 tablet 3   tizanidine (ZANAFLEX) 2 MG  capsule Take 1 capsule (2 mg total) by mouth 3 (three) times daily as needed for muscle spasms. 90 capsule 0   topiramate (TOPAMAX) 25 MG tablet SMARTSIG:1 Tablet(s) By Mouth Every Evening     TURMERIC PO Take 1 capsule by mouth daily.     varenicline (CHANTIX) 1 MG tablet Take 1 mg by mouth daily.     VITAMIN E PO Take 1 capsule by mouth daily.     No current facility-administered medications for this visit.    PHYSICAL EXAMINATION: ECOG PERFORMANCE STATUS: {CHL ONC ECOG [phone]}  There were no vitals filed for this visit. There were no vitals filed for this visit.  BREAST:*** No palpable masses or nodules in either right or left breasts. No palpable axillary supraclavicular or infraclavicular adenopathy no breast tenderness or nipple discharge. (exam performed in the presence of a chaperone)  LABORATORY DATA:  I have reviewed the data as listed    Latest Ref Rng & Units 03/16/2022    2:43 PM 07/18/2021   11:43 AM 03/09/2021   12:54 PM  CMP  Glucose 70 - 99 mg/dL 75  87  97   BUN 6 - 20 mg/dL 14  12  8    Creatinine 0.44 - 1.00 mg/dL 3.71  0.62  6.94   Sodium 135 - 145 mmol/L 136  139  140   Potassium 3.5 - 5.1 mmol/L 3.6  3.8  3.7   Chloride 98 - 111 mmol/L 105  108  105   CO2 22 - 32 mmol/L 22  24  29    Calcium 8.9 - 10.3 mg/dL 9.2  9.2  9.1   Total Protein 6.5 - 8.1 g/dL 6.6   6.8   Total Bilirubin 0.3 - 1.2 mg/dL 0.4   0.4   Alkaline Phos 38 - 126 U/L 63   50   AST 15 - 41 U/L 15   19   ALT 0 - 44 U/L 15   27     Lab Results  Component Value Date   WBC 8.1 03/16/2022   HGB 13.1 03/16/2022   HCT 38.9 03/16/2022   MCV 90.0 03/16/2022   PLT 344 03/16/2022   NEUTROABS 5.4 03/16/2022    ASSESSMENT & PLAN:  No problem-specific Assessment & Plan notes found for this encounter.    No orders of the defined types were placed in this encounter.  The patient has a good understanding of the overall plan. she agrees with it. she will call with any problems that may  develop before the next visit here. Total time spent: 30 mins including face to face time and time spent for planning, charting and co-ordination of care   Sherlyn Lick, CMA 06/07/22    I Janan Ridge am acting as a Neurosurgeon for [employer]  ***

## 2022-06-08 ENCOUNTER — Inpatient Hospital Stay
Payer: No Typology Code available for payment source | Attending: Hematology and Oncology | Admitting: Hematology and Oncology

## 2022-06-08 DIAGNOSIS — C50412 Malignant neoplasm of upper-outer quadrant of left female breast: Secondary | ICD-10-CM

## 2022-06-08 DIAGNOSIS — Z170 Estrogen receptor positive status [ER+]: Secondary | ICD-10-CM | POA: Diagnosis not present

## 2022-06-08 NOTE — Assessment & Plan Note (Signed)
05/12/2021: Right retroareolar biopsy: Negative, 0/3 sentinel lymph nodes negative Right mastectomy: Benign: UDH Left mastectomy: Grade 2 IDC 1.9 cm margins negative, ER 95%, PR 95%, HER2 negative, Ki-67 1% Oncotype DX score 13: 4% risk of distant recurrence Breast reconstruction: Latissimus flap reconstruction (chronic severe pain in the lateral chest wall) currently on gabapentin and Topamax.     Treatment plan: Adjuvant antiestrogen therapy with tamoxifen to start 03/16/2022 at 10 mg daily  Tamoxifen toxicities:  Return to clinic in 1 year for follow-up

## 2022-06-19 ENCOUNTER — Ambulatory Visit: Payer: No Typology Code available for payment source | Attending: General Surgery

## 2022-06-19 VITALS — Wt 177.1 lb

## 2022-06-19 DIAGNOSIS — Z483 Aftercare following surgery for neoplasm: Secondary | ICD-10-CM

## 2022-06-19 NOTE — Therapy (Signed)
OUTPATIENT PHYSICAL THERAPY SOZO SCREENING NOTE   Patient Name: Haley Evans MRN: [id number] DOB:[birth year], 42 y.o., female Today's Date: 06/19/2022  PCP: Iona Hansen, NP REFERRING PROVIDER: Griselda Miner, MD   PT End of Session - 06/19/22 1635     Visit Number 9   # unchanged due to screen only   PT Start Time 1633    PT Stop Time 1637    PT Time Calculation (min) 4 min    Activity Tolerance Patient tolerated treatment well    Behavior During Therapy [facility] for tasks assessed/performed             Past Medical History:  Diagnosis Date   Anemia    Anxiety    Bipolar 2 disorder (HCC)    Cancer (HCC)    breast   Depression    Family history of adverse reaction to anesthesia    aunt woke up during hip replacement   Seizure (HCC)    vs syncopy, age 65 and 12/2017, Seen by neurologist, a definitive diagnosis was not made. Pt reports having seizure aroudn 2021   Past Surgical History:  Procedure Laterality Date   ARM WOUND REPAIR / CLOSURE Right    fractured arm   BREAST IMPLANT REMOVAL Left 12/30/2021   Procedure: REMOVAL LEFT CHEST TISSUE EXPANDER;  Surgeon: Glenna Fellows, MD;  Location: [facility];  Service: Plastics;  Laterality: Left;   BREAST RECONSTRUCTION WITH PLACEMENT OF TISSUE EXPANDER AND FLEX HD (ACELLULAR HYDRATED DERMIS) Left 06/15/2021   Procedure: REPLACEMENT OF TISSUE EXPANDER AND FLEX HD (ACELLULAR HYDRATED DERMIS);  Surgeon: Peggye Form, DO;  [facility];  Service: Plastics;  Laterality: Left;   CESAREAN SECTION     DEBRIDEMENT AND CLOSURE WOUND Left 06/15/2021   Procedure: DEBRIDEMENT AND CLOSURE LEFT BREAST WOUND;  Surgeon: Peggye Form, DO;  [facility];  Service: Plastics;  Laterality: Left;  1 hour   LATISSIMUS FLAP TO BREAST Left 07/18/2021   Procedure: LATISSIMUS FLAP TO BREAST;  Surgeon: Peggye Form, DO;  [facility];  Service: Plastics;  Laterality: Left;   NIPPLE SPARING MASTECTOMY  Right 05/12/2021   Procedure: RIGHT NIPPLE SPARING MASTECTOMY;  Surgeon: Griselda Miner, MD;  Location: [facility];  Service: General;  Laterality: Right;   NIPPLE SPARING MASTECTOMY WITH SENTINEL LYMPH NODE BIOPSY Left 05/12/2021   Procedure: LEFT NIPPLE SPARING MASTECTOMY WITH LEFT SENTINEL LYMPH NODE BIOPSY;  Surgeon: Griselda Miner, MD;  Location: [facility];  Service: General;  Laterality: Left;   PRE-PECTORAL BREAST RECON W/ PLACEMENT OF TISSUE EXPANDERS, DERMAL GRAFT Bilateral 05/12/2021   Procedure: IMMEDIATE BILATERAL BREAST RECONSTRUCTION WITH PLACEMENT OF TISSUE EXPANDERS AND FLEX HD;  Surgeon: Peggye Form, DO;  Location: [facility];  Service: Plastics;  Laterality: Bilateral;   REMOVAL OF TISSUE EXPANDER Left 10/27/2021   Procedure: Left breast seroma drainage with tissue expander exchange;  Surgeon: Peggye Form, DO;  [facility];  Service: Plastics;  Laterality: Left;   TISSUE EXPANDER PLACEMENT Left 07/18/2021   Procedure: REMOVAL AND REPLACEMENT OF TISSUE EXPANDER;  Surgeon: Peggye Form, DO;  [facility];  Service: Plastics;  Laterality: Left;  3 hours   Patient Active Problem List   Diagnosis Date Noted   History of breast cancer 12/30/2021   Admission for removal of tissue expander of breast, unspecified laterality 07/18/2021   Acquired absence of left breast 07/18/2021   Open wound of left  breast 07/15/2021   Breast cancer 05/12/2021   Genetic testing 03/18/2021   Family history of pancreatic cancer 03/10/2021   Malignant neoplasm of upper-outer quadrant of left breast in female, estrogen receptor positive 03/07/2021   Anxiety 06/25/2011    REFERRING DIAG: left breast cancer at risk for lymphedema  THERAPY DIAG:  Aftercare following surgery for neoplasm  PERTINENT HISTORY: Bilateral nipple sparing mastectomy and left sentinel node biopsy (3 negative nodes) on 05/12/2021 with expanders placed for  reconstruction. It is ER/PR positive and HER2 negative with a Ki67 of 1%. Muscle flap reconstruction on left performed 07/19/21 due to complications. 10/27/2021 Left tissue expander exchange with seroma drainage. 12/30/2021 - left tissue expander removed.   PRECAUTIONS: left UE Lymphedema risk, None  SUBJECTIVE: Pt returns for her 3 month L-dex screen.   PAIN:  Are you having pain? No  SOZO SCREENING: Patient was assessed today using the SOZO machine to determine the lymphedema index score. This was compared to her baseline score. It was determined that she is within the recommended range when compared to her baseline and no further action is needed at this time. She will continue SOZO screenings. These are done every 3 months for 2 years post operatively followed by every 6 months for 2 years, and then annually.   L-DEX FLOWSHEETS - 06/19/22 1600       L-DEX LYMPHEDEMA SCREENING   Measurement Type Unilateral    L-DEX MEASUREMENT EXTREMITY Upper Extremity    POSITION  Standing    DOMINANT SIDE Right    At Risk Side Left    BASELINE SCORE (UNILATERAL) -0.7    L-DEX SCORE (UNILATERAL) 1.4    VALUE CHANGE (UNILAT) 2.1               Hermenia Bers, PTA 06/19/2022, 4:37 PM

## 2022-06-29 ENCOUNTER — Encounter: Payer: Self-pay | Admitting: Physical Medicine & Rehabilitation

## 2022-06-29 ENCOUNTER — Encounter: Payer: 59 | Attending: Physical Medicine & Rehabilitation | Admitting: Physical Medicine & Rehabilitation

## 2022-06-29 VITALS — BP 128/91 | HR 81 | Ht 69.0 in | Wt 173.0 lb

## 2022-06-29 DIAGNOSIS — F319 Bipolar disorder, unspecified: Secondary | ICD-10-CM

## 2022-06-29 DIAGNOSIS — G894 Chronic pain syndrome: Secondary | ICD-10-CM | POA: Diagnosis not present

## 2022-06-29 DIAGNOSIS — G8918 Other acute postprocedural pain: Secondary | ICD-10-CM

## 2022-06-29 DIAGNOSIS — Z79891 Long term (current) use of opiate analgesic: Secondary | ICD-10-CM

## 2022-06-29 DIAGNOSIS — M25511 Pain in right shoulder: Secondary | ICD-10-CM | POA: Diagnosis present

## 2022-06-29 MED ORDER — OXYCODONE-ACETAMINOPHEN 5-325 MG PO TABS: 1.0000 | ORAL_TABLET | Freq: Two times a day (BID) | ORAL | 0 refills | Status: DC | PRN

## 2022-06-29 NOTE — Progress Notes (Addendum)
Subjective:    Patient ID: Haley Evans, female    DOB: 1980/05/19, 42 y.o.   MRN: [id number]  HPI  Haley Conover is a 42 year old female with past medical history of bipolar disorder, breast cancer here for chronic pain.  Patient had mastectomy 05/12/2021.  She then had left breast reconstruction with placement of tissue expander 06/15/2021.  On 07/18/2021 she had another surgery after having poor wound healing and had left breast reconstruction with latissimus flap and tissue expander by Dr. Ulice Bold.  Patient reports that she has had worsening pain since her surgeries.  Pain has has most severe after her third surgery. She has had worsening pain over her left chest back and shoulder.  She has burning and tingling pain over her back at the site of the latissimus flap.  She has dull aching pain around her scapula.  She also has aching in shooting pain around her chest.  She has tried Voltaren gel and Flexeril without much benefit.  She is working with physical therapy and completing dry needling with improvement to her pain.  She has also tried gabapentin 100 mg 3 times daily but reports it makes her feel foggy.  It does provide some benefit to the pain. She also did not take the medication regularly.  She also tried Tylenol and Advil with mild to moderate improvement in her pain when she takes them. She has a history of bipolar disorder. She reports she is not on any medications at this time although she feels her bipolar disorder has been stable recently.       Visit 11/21/2021 Haley Evans is here for follow-up regarding her chronic postmastectomy pain. She had had removal and replacement of L breast tissue expander 10/27/21 by Dr. Ulice Bold.  She reports that her pain has significantly improved since this time.  She no longer has the burning and tingling pain over her chest back and shoulder.  She still has some aching pain in her left lateral back.  She is taking turmeric with benefit.  She  also sometimes takes Tylenol (less than 3g daily) or ibuprofen.  She was restarted gabapentin as patient reports she did not tolerate Lyrica very well and felt like it was affecting her mood.  She does not take the gabapentin regularly and will take 100 to 300 mg with each dose.  She is not having significant sedation with this medication.  She reports that her prior concerns of sedation with gabapentin were possibly related to other medications she was taking at the time.  Patient reports that plastic surgery has reordered her physical therapy and she plans to start this soon.   Interval history 02/07/2022 Haley Evans is here for follow-up of her chronic mastectomy related pain.  On 11 3 she had a repeat surgery with removal of left chest tissue expander.  Pain was initially worsened after this procedure and she required a few tablets of oxycodone for acute pain control.  She still has 3 tablets left.  She reports she may have an additional surgery in about 3 months.  She is back in physical therapy and seeing them once a week.  They are working on dry needling and desensitization.  She reports her surgical incisions are healed.  She continues to have flareups of pain that last a few days.  She uses Tylenol and ibuprofen to control this pain.  Gabapentin 200 mg 3 times a day helps to control the neuropathic pain component.  She will sometimes take 300  mg if the pain is worse.  The gabapentin does make her more hungry.  She also takes tizanidine which also helps her pain.  She would like to avoid any antidepressant medications. Overall she feels that her pain is fairly well-controlled and would not like to make any medication changes at this time.   Interval history 03/17/12 Haley Evans is here for chronic mastectomy related pain.  She recently had a fall where she was seen at the ER after she tripped and fell backwards resulting in a occipital laceration.  She continues to have severe pain around her lats,  chest, neck and shoulder.  She feels like the pain is significantly limiting her activities.  She does not want to do anything or go anywhere due to the pain.  Pain was not improved with Tylenol, Voltaren gel, Lyrica.  She would like to avoid antidepressant medications due to her bipolar history.  She is not using CBD Gummies at this time.  She does use Valium occasionally but would consider switching back to hydroxyzine.   Interval History 05/04/22 Haley Beecher Evans is here regarding her chronic chest wall and back pain.  She reports pain is much better controlled with oxycodone.  The medication has lasted her over a month although she is almost out of the medication.  She is not having any side effects.  She reports her friends and family have noticed that she is more active and she is able to participate better in social activities.  She also uses gabapentin that provides benefit.  She tries to use Tylenol or ibuprofen when pain is less severe and only uses oxycodone when pain is really bad.  Sometimes 5 mg oxycodone makes the pain tolerable, other times it is not strong enough and she will have poorly controlled pain.    Interval history 06/29/2022 Patient is here for follow-up regarding her chronic chest wall postmastectomy pain.  Her pain is better controlled with use of oxycodone.  She only uses it sparingly when her pain is very severe.  Some days she will do okay and other days she will have flares when the pain is much worse.  Overall the pain is more tolerable.  She also feels that the pain is getting a little bit better gradually.  She says she is planned for additional surgery this summer.  Additionally she has been having some milder pain in her right trap and shoulder.  This has been going on for about 2 weeks.  She feels that her right trapezius is tight.   Pain Inventory Average Pain 7 Pain Right Now 6 My pain is sharp, dull, stabbing, aching, and pulsating  In the last 24 hours, has pain  interfered with the following? General activity 5 Relation with others 7 Enjoyment of life 6 What TIME of day is your pain at its worst? morning  and evening Sleep (in general) Fair  Pain is worse with: inactivity, standing, and some activites Pain improves with: heat/ice, therapy/exercise, pacing activities, and medication Relief from Meds: 7  Family History  Problem Relation Age of Onset   Bladder Cancer Maternal Grandmother 21   Pancreatic cancer Maternal Grandfather 54   Social History   Socioeconomic History   Marital status: Single    Spouse name: Not on file   Number of children: Not on file   Years of education: Not on file   Highest education level: Not on file  Occupational History   Occupation: Conservator, museum/gallery    Comment: insurance company  Tobacco Use   Smoking status: Former    Years: 20    Types: Cigarettes    Quit date: 04/12/2021    Years since quitting: 1.2   Smokeless tobacco: Never  Vaping Use   Vaping Use: Never used  Substance and Sexual Activity   Alcohol use: Not Currently    Alcohol/week: 5.0 standard drinks of alcohol    Types: 5 Standard drinks or equivalent per week   Drug use: No   Sexual activity: Not on file    Comment: Copper 10 yr IUD  Other Topics Concern   Not on file  Social History Narrative   Not on file   Social Determinants of Health   Financial Resource Strain: Not on file  Food Insecurity: Not on file  Transportation Needs: Not on file  Physical Activity: Not on file  Stress: Not on file  Social Connections: Not on file   Past Surgical History:  Procedure Laterality Date   ARM WOUND REPAIR / CLOSURE Right    fractured arm   BREAST IMPLANT REMOVAL Left 12/30/2021   Procedure: REMOVAL LEFT CHEST TISSUE EXPANDER;  Surgeon: Glenna Fellows, MD;  Location: [facility];  Service: Plastics;  Laterality: Left;   BREAST RECONSTRUCTION WITH PLACEMENT OF TISSUE EXPANDER AND FLEX HD (ACELLULAR HYDRATED DERMIS) Left  06/15/2021   Procedure: REPLACEMENT OF TISSUE EXPANDER AND FLEX HD (ACELLULAR HYDRATED DERMIS);  Surgeon: Peggye Form, DO;  [facility];  Service: Plastics;  Laterality: Left;   CESAREAN SECTION     DEBRIDEMENT AND CLOSURE WOUND Left 06/15/2021   Procedure: DEBRIDEMENT AND CLOSURE LEFT BREAST WOUND;  Surgeon: Peggye Form, DO;  [facility];  Service: Plastics;  Laterality: Left;  1 hour   LATISSIMUS FLAP TO BREAST Left 07/18/2021   Procedure: LATISSIMUS FLAP TO BREAST;  Surgeon: Peggye Form, DO;  [facility];  Service: Plastics;  Laterality: Left;   NIPPLE SPARING MASTECTOMY Right 05/12/2021   Procedure: RIGHT NIPPLE SPARING MASTECTOMY;  Surgeon: Griselda Miner, MD;  Location: [facility];  Service: General;  Laterality: Right;   NIPPLE SPARING MASTECTOMY WITH SENTINEL LYMPH NODE BIOPSY Left 05/12/2021   Procedure: LEFT NIPPLE SPARING MASTECTOMY WITH LEFT SENTINEL LYMPH NODE BIOPSY;  Surgeon: Griselda Miner, MD;  Location: [facility];  Service: General;  Laterality: Left;   PRE-PECTORAL BREAST RECON W/ PLACEMENT OF TISSUE EXPANDERS, DERMAL GRAFT Bilateral 05/12/2021   Procedure: IMMEDIATE BILATERAL BREAST RECONSTRUCTION WITH PLACEMENT OF TISSUE EXPANDERS AND FLEX HD;  Surgeon: Peggye Form, DO;  Location: [facility];  Service: Plastics;  Laterality: Bilateral;   REMOVAL OF TISSUE EXPANDER Left 10/27/2021   Procedure: Left breast seroma drainage with tissue expander exchange;  Surgeon: Peggye Form, DO;  [facility];  Service: Plastics;  Laterality: Left;   TISSUE EXPANDER PLACEMENT Left 07/18/2021   Procedure: REMOVAL AND REPLACEMENT OF TISSUE EXPANDER;  Surgeon: Peggye Form, DO;  [facility];  Service: Plastics;  Laterality: Left;  3 hours   Past Surgical History:  Procedure Laterality Date   ARM WOUND REPAIR / CLOSURE Right    fractured arm   BREAST IMPLANT REMOVAL Left 12/30/2021    Procedure: REMOVAL LEFT CHEST TISSUE EXPANDER;  Surgeon: Glenna Fellows, MD;  Location: [facility];  Service: Plastics;  Laterality: Left;   BREAST RECONSTRUCTION WITH PLACEMENT OF TISSUE EXPANDER AND FLEX HD (ACELLULAR HYDRATED DERMIS) Left 06/15/2021   Procedure: REPLACEMENT OF TISSUE  EXPANDER AND FLEX HD (ACELLULAR HYDRATED DERMIS);  Surgeon: Peggye Form, DO;  [facility];  Service: Plastics;  Laterality: Left;   CESAREAN SECTION     DEBRIDEMENT AND CLOSURE WOUND Left 06/15/2021   Procedure: DEBRIDEMENT AND CLOSURE LEFT BREAST WOUND;  Surgeon: Peggye Form, DO;  [facility];  Service: Plastics;  Laterality: Left;  1 hour   LATISSIMUS FLAP TO BREAST Left 07/18/2021   Procedure: LATISSIMUS FLAP TO BREAST;  Surgeon: Peggye Form, DO;  [facility];  Service: Plastics;  Laterality: Left;   NIPPLE SPARING MASTECTOMY Right 05/12/2021   Procedure: RIGHT NIPPLE SPARING MASTECTOMY;  Surgeon: Griselda Miner, MD;  Location: [facility];  Service: General;  Laterality: Right;   NIPPLE SPARING MASTECTOMY WITH SENTINEL LYMPH NODE BIOPSY Left 05/12/2021   Procedure: LEFT NIPPLE SPARING MASTECTOMY WITH LEFT SENTINEL LYMPH NODE BIOPSY;  Surgeon: Griselda Miner, MD;  Location: [facility];  Service: General;  Laterality: Left;   PRE-PECTORAL BREAST RECON W/ PLACEMENT OF TISSUE EXPANDERS, DERMAL GRAFT Bilateral 05/12/2021   Procedure: IMMEDIATE BILATERAL BREAST RECONSTRUCTION WITH PLACEMENT OF TISSUE EXPANDERS AND FLEX HD;  Surgeon: Peggye Form, DO;  Location: [facility];  Service: Plastics;  Laterality: Bilateral;   REMOVAL OF TISSUE EXPANDER Left 10/27/2021   Procedure: Left breast seroma drainage with tissue expander exchange;  Surgeon: Peggye Form, DO;  [facility];  Service: Plastics;  Laterality: Left;   TISSUE EXPANDER PLACEMENT Left 07/18/2021   Procedure: REMOVAL AND REPLACEMENT OF TISSUE  EXPANDER;  Surgeon: Peggye Form, DO;  [facility];  Service: Plastics;  Laterality: Left;  3 hours   Past Medical History:  Diagnosis Date   Anemia    Anxiety    Bipolar 2 disorder (HCC)    Cancer (HCC)    breast   Depression    Family history of adverse reaction to anesthesia    aunt woke up during hip replacement   Seizure (HCC)    vs syncopy, age 61 and 12/2017, Seen by neurologist, a definitive diagnosis was not made. Pt reports having seizure aroudn 2021   BP (!) 134/92   Pulse 81   Ht 5\' 9"  (1.753 m)   Wt 173 lb (78.5 kg)   SpO2 96%   BMI 25.55 kg/m   Opioid Risk Score:   Fall Risk Score:  `1 Depression screen [facility] 2/9     02/07/2022   11:44 AM 11/21/2021    2:02 PM 09/29/2021    1:46 PM  Depression screen PHQ 2/9  Decreased Interest 0 0 1  Down, Depressed, Hopeless 0 0 0  PHQ - 2 Score 0 0 1  Altered sleeping   1  Tired, decreased energy   2  Change in appetite   2  Feeling bad or failure about yourself    0  Trouble concentrating   1  Moving slowly or fidgety/restless   0  Suicidal thoughts   0  PHQ-9 Score   7  Difficult doing work/chores   Somewhat difficult     Review of Systems  Musculoskeletal:  Positive for back pain and neck pain.       Rt shoulder upper extremity pain  All other systems reviewed and are negative.     Objective:   Physical Exam  Gen: no distress, normal appearing HEENT: oral mucosa pink and moist, NCAT Chest: normal effort, normal rate of breathing Abd: soft, non-distended Ext: no  edema Psych: pleasant, normal affect Skin: intact Neuro: Alert , , follows commands, cranial nerves II through XII grossly intact, sensation intact to light touch in all 4 extremities Strength 5 out of 5 in all 4 extremities Musculoskeletal: She continues to have tenderness noted over left anterior chest, upper arm S/p latissimus flap surgery on the left, tenderness in over lateral left posterior back Right trapezius tightness and  tenderness extending to the right deltoid Mild pain with shoulder external rotation, minimal pain with internal rotation, Neer's test negative, Hawkins test equivocal      Assessment & Plan:   Post mastectomy and reconstruction pain, gradually improving.  She had removal of eft chest tissue expander on the left on 12/30/21. -Continue gabapentin patient says she decreased dose 100mg  TID recently, okay if she takes 100 to 300 mg 3 times daily dose limited by sedation -Continue tizanidine PRN - she reports pain benefit Continue ibuprofen and tylenol PRN- discussed no more than 3000mg  tylenol from all sources a day -Continue tumaric  -Continue oxycodone current dose 1-1.5 tabs up to Q12h PRN, 60 tabs ordered -Pill count appears consistent -Discussed increased risks of using opioid medications while using diazepam- last visit -PDMP reviewed -Patient reports additional surgery scheduled for July hopefully this will further decrease her pain  Right shoulder and trapezius trapezius pain -Possibly related to postural/muscular imbalances -Continue Voltaren gel -Discussed trying Thera cane for trapezius -Consider x-ray for further evaluation if worsens -Will order Zynax cryo heat device    Hx of bipolar disorder not on medications -She reports her mood has been stable.  -She discussed again that she would like to avoid antidepressant class medications  08/18/22 called patient, she had surgery today and having postsurgical pain. Her prior pain medication was refilled by surgeon-pharmacy declined this. Will order percocet 7.5 1-1.5 tabs q6h PRN for 4 days.    08/25/22 called patient back regarding her postsurgical pain, medication is helping keep pain controlled, will refill  percocet 7.5 1-1.5 tabs q6h PRN for 4 days.

## 2022-07-03 ENCOUNTER — Encounter: Payer: Self-pay | Admitting: Physical Medicine & Rehabilitation

## 2022-07-19 NOTE — Progress Notes (Signed)
Patient left this appointment prior to being seen.

## 2022-08-03 ENCOUNTER — Other Ambulatory Visit: Payer: Self-pay | Admitting: Physical Medicine & Rehabilitation

## 2022-08-04 ENCOUNTER — Telehealth: Payer: Self-pay | Admitting: Registered Nurse

## 2022-08-04 MED ORDER — OXYCODONE-ACETAMINOPHEN 5-325 MG PO TABS: 1.0000 | ORAL_TABLET | Freq: Two times a day (BID) | ORAL | 0 refills | Status: DC | PRN

## 2022-08-04 NOTE — Telephone Encounter (Signed)
PMP was Reviewed.  Dr Natale Lay note was reviewed. Oxycodone e-scribed to pharmacy  Call placed to Ms. Hereford regarding the above, she verbalizes understanding.

## 2022-08-11 ENCOUNTER — Other Ambulatory Visit: Payer: Self-pay

## 2022-08-11 ENCOUNTER — Encounter (HOSPITAL_BASED_OUTPATIENT_CLINIC_OR_DEPARTMENT_OTHER): Payer: Self-pay | Admitting: Plastic Surgery

## 2022-08-15 ENCOUNTER — Other Ambulatory Visit: Payer: Self-pay | Admitting: Physical Medicine & Rehabilitation

## 2022-08-17 MED ORDER — CHLORHEXIDINE GLUCONATE CLOTH 2 % EX PADS: 6.0000 | MEDICATED_PAD | Freq: Once | CUTANEOUS | Status: DC

## 2022-08-17 NOTE — Progress Notes (Signed)

## 2022-08-18 ENCOUNTER — Ambulatory Visit (HOSPITAL_BASED_OUTPATIENT_CLINIC_OR_DEPARTMENT_OTHER): Payer: No Typology Code available for payment source | Admitting: Anesthesiology

## 2022-08-18 ENCOUNTER — Encounter (HOSPITAL_BASED_OUTPATIENT_CLINIC_OR_DEPARTMENT_OTHER)
Admission: RE | Disposition: A | Discharge status: Home / Self Care | Payer: Self-pay | Source: Ambulatory Visit | Attending: Plastic Surgery

## 2022-08-18 ENCOUNTER — Encounter (HOSPITAL_BASED_OUTPATIENT_CLINIC_OR_DEPARTMENT_OTHER): Payer: Self-pay | Admitting: Plastic Surgery

## 2022-08-18 ENCOUNTER — Other Ambulatory Visit: Payer: Self-pay

## 2022-08-18 ENCOUNTER — Ambulatory Visit (HOSPITAL_BASED_OUTPATIENT_CLINIC_OR_DEPARTMENT_OTHER)
Admission: RE | Admit: 2022-08-18 | Discharge: 2022-08-18 | Disposition: A | Discharge status: Home / Self Care | Payer: No Typology Code available for payment source | Source: Ambulatory Visit | Attending: Plastic Surgery | Admitting: Plastic Surgery

## 2022-08-18 ENCOUNTER — Telehealth: Payer: Self-pay

## 2022-08-18 DIAGNOSIS — Z853 Personal history of malignant neoplasm of breast: Secondary | ICD-10-CM | POA: Diagnosis not present

## 2022-08-18 DIAGNOSIS — F418 Other specified anxiety disorders: Secondary | ICD-10-CM | POA: Diagnosis not present

## 2022-08-18 DIAGNOSIS — Z9010 Acquired absence of unspecified breast and nipple: Secondary | ICD-10-CM

## 2022-08-18 DIAGNOSIS — Z01818 Encounter for other preprocedural examination: Secondary | ICD-10-CM

## 2022-08-18 DIAGNOSIS — F319 Bipolar disorder, unspecified: Secondary | ICD-10-CM | POA: Insufficient documentation

## 2022-08-18 DIAGNOSIS — Z87891 Personal history of nicotine dependence: Secondary | ICD-10-CM | POA: Insufficient documentation

## 2022-08-18 DIAGNOSIS — Z170 Estrogen receptor positive status [ER+]: Secondary | ICD-10-CM | POA: Diagnosis not present

## 2022-08-18 DIAGNOSIS — C50412 Malignant neoplasm of upper-outer quadrant of left female breast: Secondary | ICD-10-CM | POA: Diagnosis not present

## 2022-08-18 DIAGNOSIS — Z9013 Acquired absence of bilateral breasts and nipples: Secondary | ICD-10-CM | POA: Diagnosis not present

## 2022-08-18 DIAGNOSIS — Z421 Encounter for breast reconstruction following mastectomy: Secondary | ICD-10-CM | POA: Diagnosis not present

## 2022-08-18 HISTORY — PX: WOUND EXPLORATION: SHX6188

## 2022-08-18 HISTORY — PX: BREAST RECONSTRUCTION WITH PLACEMENT OF TISSUE EXPANDER AND ALLODERM: SHX6805

## 2022-08-18 LAB — POCT PREGNANCY, URINE: Preg Test, Ur: NEGATIVE

## 2022-08-18 SURGERY — BREAST RECONSTRUCTION WITH PLACEMENT OF TISSUE EXPANDER AND ALLODERM
Anesthesia: General | Site: Breast | Laterality: Left

## 2022-08-18 MED ORDER — ACETAMINOPHEN 500 MG PO TABS
1000.0000 mg | ORAL_TABLET | ORAL | Status: AC
  Administered 2022-08-18: 1000 mg via ORAL

## 2022-08-18 MED ORDER — PHENYLEPHRINE 80 MCG/ML (10ML) SYRINGE FOR IV PUSH (FOR BLOOD PRESSURE SUPPORT)
PREFILLED_SYRINGE | INTRAVENOUS | Status: AC
  Filled 2022-08-18: qty 30

## 2022-08-18 MED ORDER — SULFAMETHOXAZOLE-TRIMETHOPRIM 800-160 MG PO TABS: 1.0000 | ORAL_TABLET | Freq: Two times a day (BID) | ORAL | 0 refills | Status: DC

## 2022-08-18 MED ORDER — ACETAMINOPHEN 500 MG PO TABS
ORAL_TABLET | ORAL | Status: AC
  Filled 2022-08-18: qty 2

## 2022-08-18 MED ORDER — CELECOXIB 200 MG PO CAPS
200.0000 mg | ORAL_CAPSULE | ORAL | Status: AC
  Administered 2022-08-18: 200 mg via ORAL

## 2022-08-18 MED ORDER — GABAPENTIN 300 MG PO CAPS
ORAL_CAPSULE | ORAL | Status: AC
  Filled 2022-08-18: qty 1

## 2022-08-18 MED ORDER — ONDANSETRON HCL 4 MG/2ML IJ SOLN
INTRAMUSCULAR | Status: DC | PRN
  Administered 2022-08-18: 4 mg via INTRAVENOUS

## 2022-08-18 MED ORDER — VANCOMYCIN HCL IN DEXTROSE 1-5 GM/200ML-% IV SOLN
INTRAVENOUS | Status: AC
  Filled 2022-08-18: qty 200

## 2022-08-18 MED ORDER — POVIDONE-IODINE 10 % EX SOLN
CUTANEOUS | Status: DC | PRN
  Administered 2022-08-18: 1 via TOPICAL

## 2022-08-18 MED ORDER — OXYCODONE HCL 5 MG PO TABS
5.0000 mg | ORAL_TABLET | Freq: Once | ORAL | Status: AC | PRN
  Administered 2022-08-18: 5 mg via ORAL

## 2022-08-18 MED ORDER — OXYCODONE-ACETAMINOPHEN 5-325 MG PO TABS: 1.0000 | ORAL_TABLET | Freq: Two times a day (BID) | ORAL | 0 refills | Status: DC | PRN

## 2022-08-18 MED ORDER — ONDANSETRON HCL 4 MG/2ML IJ SOLN: 4.0000 mg | Freq: Four times a day (QID) | INTRAMUSCULAR | Status: DC | PRN

## 2022-08-18 MED ORDER — PROPOFOL 500 MG/50ML IV EMUL
INTRAVENOUS | Status: AC
  Filled 2022-08-18: qty 50

## 2022-08-18 MED ORDER — DEXAMETHASONE SODIUM PHOSPHATE 10 MG/ML IJ SOLN
INTRAMUSCULAR | Status: AC
  Filled 2022-08-18: qty 1

## 2022-08-18 MED ORDER — PHENYLEPHRINE HCL (PRESSORS) 10 MG/ML IV SOLN
INTRAVENOUS | Status: DC | PRN
  Administered 2022-08-18: 80 ug via INTRAVENOUS

## 2022-08-18 MED ORDER — DEXAMETHASONE SODIUM PHOSPHATE 4 MG/ML IJ SOLN
INTRAMUSCULAR | Status: DC | PRN
  Administered 2022-08-18: 10 mg via INTRAVENOUS

## 2022-08-18 MED ORDER — ROCURONIUM BROMIDE 100 MG/10ML IV SOLN
INTRAVENOUS | Status: DC | PRN
  Administered 2022-08-18: 50 mg via INTRAVENOUS

## 2022-08-18 MED ORDER — SODIUM CHLORIDE 0.9 % IV SOLN
INTRAVENOUS | Status: AC | PRN
  Administered 2022-08-18: 120 mL

## 2022-08-18 MED ORDER — LIDOCAINE 2% (20 MG/ML) 5 ML SYRINGE
INTRAMUSCULAR | Status: AC
  Filled 2022-08-18: qty 5

## 2022-08-18 MED ORDER — LACTATED RINGERS IV SOLN: INTRAVENOUS | Status: DC

## 2022-08-18 MED ORDER — FENTANYL CITRATE (PF) 100 MCG/2ML IJ SOLN
INTRAMUSCULAR | Status: DC | PRN
  Administered 2022-08-18 (×4): 50 ug via INTRAVENOUS

## 2022-08-18 MED ORDER — BUPIVACAINE HCL (PF) 0.5 % IJ SOLN
INTRAMUSCULAR | Status: DC | PRN
  Administered 2022-08-18: 20 mL

## 2022-08-18 MED ORDER — OXYCODONE HCL 5 MG PO TABS
ORAL_TABLET | ORAL | Status: AC
  Filled 2022-08-18: qty 1

## 2022-08-18 MED ORDER — HYDROMORPHONE HCL 1 MG/ML IJ SOLN
INTRAMUSCULAR | Status: AC
  Filled 2022-08-18: qty 0.5

## 2022-08-18 MED ORDER — ONDANSETRON HCL 4 MG/2ML IJ SOLN
INTRAMUSCULAR | Status: AC
  Filled 2022-08-18: qty 2

## 2022-08-18 MED ORDER — FENTANYL CITRATE (PF) 100 MCG/2ML IJ SOLN
INTRAMUSCULAR | Status: AC
  Filled 2022-08-18: qty 2

## 2022-08-18 MED ORDER — SODIUM CHLORIDE 0.9 % IV SOLN
INTRAVENOUS | Status: AC
  Filled 2022-08-18: qty 10

## 2022-08-18 MED ORDER — MIDAZOLAM HCL 2 MG/2ML IJ SOLN
INTRAMUSCULAR | Status: AC
  Filled 2022-08-18: qty 2

## 2022-08-18 MED ORDER — PROPOFOL 10 MG/ML IV BOLUS
INTRAVENOUS | Status: AC
  Filled 2022-08-18: qty 20

## 2022-08-18 MED ORDER — HYDROMORPHONE HCL 1 MG/ML IJ SOLN
0.5000 mg | Freq: Once | INTRAMUSCULAR | Status: AC
  Administered 2022-08-18: 0.5 mg via INTRAVENOUS

## 2022-08-18 MED ORDER — FENTANYL CITRATE (PF) 100 MCG/2ML IJ SOLN
25.0000 ug | INTRAMUSCULAR | Status: DC | PRN
  Administered 2022-08-18 (×3): 50 ug via INTRAVENOUS

## 2022-08-18 MED ORDER — PROPOFOL 10 MG/ML IV BOLUS
INTRAVENOUS | Status: DC | PRN
  Administered 2022-08-18: 200 mg via INTRAVENOUS

## 2022-08-18 MED ORDER — GABAPENTIN 300 MG PO CAPS
300.0000 mg | ORAL_CAPSULE | ORAL | Status: AC
  Administered 2022-08-18: 300 mg via ORAL

## 2022-08-18 MED ORDER — MIDAZOLAM HCL 5 MG/5ML IJ SOLN
INTRAMUSCULAR | Status: DC | PRN
  Administered 2022-08-18: 2 mg via INTRAVENOUS

## 2022-08-18 MED ORDER — LIDOCAINE HCL (CARDIAC) PF 100 MG/5ML IV SOSY
PREFILLED_SYRINGE | INTRAVENOUS | Status: DC | PRN
  Administered 2022-08-18: 80 mg via INTRAVENOUS

## 2022-08-18 MED ORDER — SUGAMMADEX SODIUM 200 MG/2ML IV SOLN
INTRAVENOUS | Status: DC | PRN
  Administered 2022-08-18: 200 mg via INTRAVENOUS

## 2022-08-18 MED ORDER — CELECOXIB 200 MG PO CAPS
ORAL_CAPSULE | ORAL | Status: AC
  Filled 2022-08-18: qty 1

## 2022-08-18 MED ORDER — OXYCODONE-ACETAMINOPHEN 7.5-325 MG PO TABS: 1.0000 | ORAL_TABLET | Freq: Four times a day (QID) | ORAL | 0 refills | Status: DC | PRN

## 2022-08-18 MED ORDER — SODIUM CHLORIDE 0.9 % IV SOLN
INTRAVENOUS | Status: DC | PRN
  Administered 2022-08-18: 500 mL

## 2022-08-18 MED ORDER — 0.9 % SODIUM CHLORIDE (POUR BTL) OPTIME
TOPICAL | Status: DC | PRN
  Administered 2022-08-18: 1000 mL

## 2022-08-18 MED ORDER — PROPOFOL 500 MG/50ML IV EMUL
INTRAVENOUS | Status: DC | PRN
  Administered 2022-08-18: 25 ug/kg/min via INTRAVENOUS

## 2022-08-18 MED ORDER — VANCOMYCIN HCL IN DEXTROSE 1-5 GM/200ML-% IV SOLN
1000.0000 mg | INTRAVENOUS | Status: AC
  Administered 2022-08-18: 1000 mg via INTRAVENOUS

## 2022-08-18 MED ORDER — EPHEDRINE 5 MG/ML INJ
INTRAVENOUS | Status: AC
  Filled 2022-08-18: qty 5

## 2022-08-18 MED ORDER — METHOCARBAMOL 500 MG PO TABS: 500.0000 mg | ORAL_TABLET | Freq: Three times a day (TID) | ORAL | 0 refills | Status: DC | PRN

## 2022-08-18 MED ORDER — OXYCODONE HCL 5 MG/5ML PO SOLN: 5.0000 mg | Freq: Once | ORAL | Status: AC | PRN

## 2022-08-18 SURGICAL SUPPLY — 78 items
ADH SKN CLS APL DERMABOND .7 (GAUZE/BANDAGES/DRESSINGS) ×2
ALLOGRAFT PERF 16X20 1.6+/-0.4 (Tissue) IMPLANT
APL PRP STRL LF DISP 70% ISPRP (MISCELLANEOUS) ×4
APPLIER CLIP 9.375 MED OPEN (MISCELLANEOUS) ×2
APR CLP MED 9.3 20 MLT OPN (MISCELLANEOUS) ×2
BAG DECANTER FOR FLEXI CONT (MISCELLANEOUS) ×3 IMPLANT
BINDER BREAST LRG (GAUZE/BANDAGES/DRESSINGS) IMPLANT
BINDER BREAST MEDIUM (GAUZE/BANDAGES/DRESSINGS) IMPLANT
BINDER BREAST XLRG (GAUZE/BANDAGES/DRESSINGS) IMPLANT
BINDER BREAST XXLRG (GAUZE/BANDAGES/DRESSINGS) IMPLANT
BLADE SURG 10 STRL SS (BLADE) ×3 IMPLANT
BLADE SURG 15 STRL LF DISP TIS (BLADE) ×3 IMPLANT
BLADE SURG 15 STRL SS (BLADE) ×2
BNDG GAUZE DERMACEA FLUFF 4 (GAUZE/BANDAGES/DRESSINGS) IMPLANT
BNDG GZE DERMACEA 4 6PLY (GAUZE/BANDAGES/DRESSINGS)
CANISTER SUCT 1200ML W/VALVE (MISCELLANEOUS) ×3 IMPLANT
CHLORAPREP W/TINT 26 (MISCELLANEOUS) ×3 IMPLANT
CLIP APPLIE 9.375 MED OPEN (MISCELLANEOUS) IMPLANT
COVER BACK TABLE 60X90IN (DRAPES) ×3 IMPLANT
COVER MAYO STAND STRL (DRAPES) ×3 IMPLANT
DERMABOND ADVANCED .7 DNX12 (GAUZE/BANDAGES/DRESSINGS) ×3 IMPLANT
DRAIN CHANNEL 15F RND FF W/TCR (WOUND CARE) IMPLANT
DRAIN CHANNEL 19F RND (DRAIN) IMPLANT
DRAPE INCISE IOBAN 66X45 STRL (DRAPES) IMPLANT
DRAPE TOP ARMCOVERS (MISCELLANEOUS) ×3 IMPLANT
DRAPE U-SHAPE 76X120 STRL (DRAPES) ×3 IMPLANT
DRAPE UTILITY XL STRL (DRAPES) ×3 IMPLANT
DRSG TEGADERM 2-3/8X2-3/4 SM (GAUZE/BANDAGES/DRESSINGS) IMPLANT
DRSG TEGADERM 4X10 (GAUZE/BANDAGES/DRESSINGS) IMPLANT
ELECT BLADE 4.0 EZ CLEAN MEGAD (MISCELLANEOUS) ×2
ELECT COATED BLADE 2.86 ST (ELECTRODE) ×3 IMPLANT
ELECT REM PT RETURN 9FT ADLT (ELECTROSURGICAL) ×2
ELECTRODE BLDE 4.0 EZ CLN MEGD (MISCELLANEOUS) ×3 IMPLANT
ELECTRODE REM PT RTRN 9FT ADLT (ELECTROSURGICAL) IMPLANT
EVACUATOR SILICONE 100CC (DRAIN) IMPLANT
EXPANDER TISSUE MX FOURTE 400 (Prosthesis & Implant Plastic) IMPLANT
GAUZE PAD ABD 8X10 STRL (GAUZE/BANDAGES/DRESSINGS) ×6 IMPLANT
GLOVE BIO SURGEON STRL SZ 6 (GLOVE) ×3 IMPLANT
GLOVE BIO SURGEON STRL SZ8 (GLOVE) IMPLANT
GLOVE BIOGEL PI IND STRL 7.0 (GLOVE) IMPLANT
GLOVE BIOGEL PI IND STRL 8 (GLOVE) IMPLANT
GOWN STRL REUS W/ TWL LRG LVL3 (GOWN DISPOSABLE) ×6 IMPLANT
GOWN STRL REUS W/ TWL XL LVL3 (GOWN DISPOSABLE) IMPLANT
GOWN STRL REUS W/TWL LRG LVL3 (GOWN DISPOSABLE) ×2
GOWN STRL REUS W/TWL XL LVL3 (GOWN DISPOSABLE) ×2
KIT FILL ASEPTIC TRANSFER (MISCELLANEOUS) IMPLANT
MARKER SKIN DUAL TIP RULER LAB (MISCELLANEOUS) IMPLANT
NDL HYPO 25X1 1.5 SAFETY (NEEDLE) IMPLANT
NDL SAFETY ECLIP 18X1.5 (MISCELLANEOUS) ×3 IMPLANT
NEEDLE HYPO 25X1 1.5 SAFETY (NEEDLE) ×2 IMPLANT
PENCIL SMOKE EVACUATOR (MISCELLANEOUS) ×3 IMPLANT
PIN SAFETY STERILE (MISCELLANEOUS) IMPLANT
PUNCH BIOPSY 4MM DISP (MISCELLANEOUS) IMPLANT
SHEET MEDIUM DRAPE 40X70 STRL (DRAPES) ×3 IMPLANT
SLEEVE SCD COMPRESS KNEE MED (STOCKING) IMPLANT
SPONGE T-LAP 18X18 ~~LOC~~+RFID (SPONGE) ×6 IMPLANT
STAPLER VISISTAT 35W (STAPLE) ×3 IMPLANT
SUT CHROMIC 4 0 PS 2 18 (SUTURE) IMPLANT
SUT ETHILON 2 0 FS 18 (SUTURE) ×3 IMPLANT
SUT MNCRL AB 4-0 PS2 18 (SUTURE) ×3 IMPLANT
SUT PDS AB 2-0 CT2 27 (SUTURE) IMPLANT
SUT SILK 2 0 SH (SUTURE) IMPLANT
SUT VIC AB 3-0 SH 27 (SUTURE) ×2
SUT VIC AB 3-0 SH 27X BRD (SUTURE) IMPLANT
SUT VIC AB 4-0 PS2 18 (SUTURE) IMPLANT
SUT VICRYL 0 CT-2 (SUTURE) IMPLANT
SUT VLOC 180 0 24IN GS25 (SUTURE) IMPLANT
SYR 10ML LL (SYRINGE) ×3 IMPLANT
SYR 50ML LL SCALE MARK (SYRINGE) ×3 IMPLANT
SYR BULB IRRIG 60ML STRL (SYRINGE) ×3 IMPLANT
SYR CONTROL 10ML LL (SYRINGE) IMPLANT
TAPE MEASURE VINYL STERILE (MISCELLANEOUS) IMPLANT
TISSUE EXPNDR MX FOURTE 400 (Prosthesis & Implant Plastic) ×2 IMPLANT
TOWEL GREEN STERILE FF (TOWEL DISPOSABLE) ×3 IMPLANT
TRAY DSU PREP LF (CUSTOM PROCEDURE TRAY) IMPLANT
TUBE CONNECTING 20X1/4 (TUBING) IMPLANT
UNDERPAD 30X36 HEAVY ABSORB (UNDERPADS AND DIAPERS) ×6 IMPLANT
YANKAUER SUCT BULB TIP NO VENT (SUCTIONS) ×3 IMPLANT

## 2022-08-18 NOTE — Op Note (Signed)
Operative Note   DATE OF OPERATION: 6.21.2024  LOCATION: [facility]-outpatient  SURGICAL DIVISION: Plastic Surgery  PREOPERATIVE DIAGNOSES:  1. History breast cancer 2. Acquired absence breasts  POSTOPERATIVE DIAGNOSES:  same  PROCEDURE:  1. Left breast reconstruction with tissue expander 2. Acellular dermis to left chest (Alloderm) 3. Division peripheral nerve left thoracodorsal n.  SURGEON: Glenna Fellows MD MBA  ASSISTANT: none  ANESTHESIA:  General.   EBL: 20 ml  COMPLICATIONS: None immediate.   INDICATIONS FOR PROCEDURE:  The patient, Haley Evans, is a 41 y.o. female born on August 07, 1980, is here for breast reconstruction. Patient has had prior latissimus flap reconstruction on left with removal expander due to infection and threatened exposure. Plan replacement. Patient complains pain over left chest back. Plan exploration axilla for division thoracodorsal nerve to latissimus muscle.   FINDINGS: Left thoracodorsal nerve to latissimus muscle divided at proximal muscle. Natrelle 133S-MX-12-T 400 ml tissue expander placed initial fill volume 120 ml saline SN [id number]  DESCRIPTION OF PROCEDURE:   The patient was marked with the patient in the preoperative area. The patient's operative site was prepped and draped in a sterile fashion. A time out was performed and all information was confirmed to be correct. Incision made in left inframammary fold scar and carried through superficial fascia to pectoralis muscle surface. Mastectomy flap including latissimus muscle and associated soft tissue elevated off pectoralis muscle to dimensions of anticipated expander placement. The pectoralis muscle was dissected free from mastectomy flap and latissimus muscle and secured to chest wall with 0 V lock suture. Dissection completed toward axilla following tract of latissimus muscle. The thoracodorsal nerve was identified and divided. The latissimus muscle was noted to be located over  lower outer quadrant in area of skin paddle and no latissimus present over medial upper chest. I elected to proceed with placement acellular dermis soft tissue support.   Cavity irrigated with saline solution containing Ancef, gentamicin, and Betadine. Local anesthetic infiltrated. A 19 Fr drain was placed in subcutaneous position laterally and a 15 Fr drain placed along inframammary fold. Each secured to skin with 2-0 nylon. The tissue expander was prepared on back table prior in insertion. The expander was filled with air. Perforated acellular dermis was draped over anterior surface expander. The ADM was then secured to itself over posterior surface of expander with 4-0 chromic. Redundant folds acellular dermis excised so that the ADM lay flat without folds over air filled expander. The expander was secured to fascia over lateral sternal border with a 2-0 PDS. The lateral tab was also secured to pectoralis muscle with 2-0 PDS. The ADM was secured to pectoralis muscle and chest wall along inferior border at inframammary fold with 0 V-lock suture. Skin closure completed with 3-0 vicryl in fascial layer and 4-0 vicryl in dermis. Skin closure completed with 4-0 monocryl subcuticular. Dermabond applied over incisions. Tegaderm dressings applied followed by dry dressing, breast binder.  The patient was allowed to wake from anesthesia, extubated and taken to the recovery room in satisfactory condition.   SPECIMENS: none  DRAINS: 15 and 19 Fr JP in left subcutaneous chest  Glenna Fellows, MD [facility] Plastic & Reconstructive Surgery  Office/ physician access line after hours [phone]

## 2022-08-18 NOTE — Telephone Encounter (Addendum)
Patient had breast surgery today at the [facility]. The surgeon call in Oxycodone 5-325 MG. However [facility] will no release it this point because the refill is too early.   She does have some pain medication on hand. But as stated before in My Chart the Oxycodone 5-325 is not helping the pain. Patient is requesting more pain relief especially since having surgery. Please advise.   Will you please call her to discuss the problem. [phone].  Thank you  PMP  Filled  Written  ID  Drug  QTY  Days  Prescriber  RX #  Dispenser  Refill  Daily Dose*  Pymt Type  PMP  08/14/2022 08/14/2022 1  Alprazolam 0.5 Mg Tablet 30.00 30 Pe Jon [id number] [facility] ([id number]) 0/0 1.00 LME Comm Ins [place] 08/06/2022 08/04/2022 1  Oxycodone-Acetaminophen 5-325 90.00 30 Eu Tho [id number] [facility] ([id number]) 0/0 22.50 MME Comm Ins [place] 07/06/2022 06/29/2022 1  Oxycodone-Acetaminophen 5-325 90.00 30 Yu Sht [id number] [facility] ([id number]) 0/0 22.50 MME Comm Ins [place]

## 2022-08-18 NOTE — Discharge Instructions (Signed)
May have Tylenol/ Ibuprofen today after 3:34 PM   Post Anesthesia Home Care Instructions  Activity: Get plenty of rest for the remainder of the day. A responsible individual must stay with you for 24 hours following the procedure.  For the next 24 hours, DO NOT: -Drive a car -Advertising copywriter -Drink alcoholic beverages -Take any medication unless instructed by your physician -Make any legal decisions or sign important papers.  Meals: Start with liquid foods such as gelatin or soup. Progress to regular foods as tolerated. Avoid greasy, spicy, heavy foods. If nausea and/or vomiting occur, drink only clear liquids until the nausea and/or vomiting subsides. Call your physician if vomiting continues.  Special Instructions/Symptoms: Your throat may feel dry or sore from the anesthesia or the breathing tube placed in your throat during surgery. If this causes discomfort, gargle with warm salt water. The discomfort should disappear within 24 hours.  If you had a scopolamine patch placed behind your ear for the management of post- operative nausea and/or vomiting:  1. The medication in the patch is effective for 72 hours, after which it should be removed.  Wrap patch in a tissue and discard in the trash. Wash hands thoroughly with soap and water. 2. You may remove the patch earlier than 72 hours if you experience unpleasant side effects which may include dry mouth, dizziness or visual disturbances. 3. Avoid touching the patch. Wash your hands with soap and water after contact with the patch.

## 2022-08-18 NOTE — Addendum Note (Signed)
Addended by: Fanny Dance on: 08/18/2022 04:30 PM   Modules accepted: Orders

## 2022-08-18 NOTE — Transfer of Care (Signed)
Immediate Anesthesia Transfer of Care Note  Patient: Haley Evans  Procedure(s) Performed: BREAST RECONSTRUCTION WITH PLACEMENT OF TISSUE EXPANDER AND  ALLODERM (Left: Breast) PERIPHERAL NERVE DIVISION (Left: Axilla)  Patient Location: PACU  Anesthesia Type:General  Level of Consciousness: awake, alert , and oriented  Airway & Oxygen Therapy: Patient Spontanous Breathing and Patient connected to face mask oxygen  Post-op Assessment: Report given to RN and Post -op Vital signs reviewed and stable  Post vital signs: Reviewed and stable  Last Vitals:  Vitals Value Taken Time  BP 112/81 (92)   Temp    Pulse 104 08/18/22 1307  Resp 23   SpO2 98 % 08/18/22 1306  Vitals shown include unvalidated device data.  Last Pain:  Vitals:   08/18/22 0931  TempSrc: Temporal  PainSc: 0-No pain         Complications: No notable events documented.

## 2022-08-18 NOTE — Anesthesia Preprocedure Evaluation (Addendum)
Anesthesia Evaluation  Patient identified by MRN, date of birth, ID band Patient awake    Reviewed: Allergy & Precautions, H&P , NPO status , Patient's Chart, lab work & pertinent test results  Airway Mallampati: II   Neck ROM: full    Dental   Pulmonary former smoker   breath sounds clear to auscultation       Cardiovascular negative cardio ROS  Rhythm:regular Rate:Normal     Neuro/Psych Seizures -,  PSYCHIATRIC DISORDERS Anxiety Depression Bipolar Disorder      GI/Hepatic   Endo/Other    Renal/GU      Musculoskeletal   Abdominal   Peds  Hematology   Anesthesia Other Findings   Reproductive/Obstetrics                             Anesthesia Physical Anesthesia Plan  ASA: 2  Anesthesia Plan: General   Post-op Pain Management:    Induction: Intravenous  PONV Risk Score and Plan: 3 and Ondansetron, Dexamethasone, Midazolam and Treatment may vary due to age or medical condition  Airway Management Planned: Oral ETT  Additional Equipment:   Intra-op Plan:   Post-operative Plan: Extubation in OR  Informed Consent: I have reviewed the patients History and Physical, chart, labs and discussed the procedure including the risks, benefits and alternatives for the proposed anesthesia with the patient or authorized representative who has indicated his/her understanding and acceptance.     Dental advisory given  Plan Discussed with: CRNA, Anesthesiologist and Surgeon  Anesthesia Plan Comments:        Anesthesia Quick Evaluation

## 2022-08-18 NOTE — Anesthesia Postprocedure Evaluation (Signed)
Anesthesia Post Note  Patient: Radiation protection practitioner  Procedure(s) Performed: BREAST RECONSTRUCTION WITH PLACEMENT OF TISSUE EXPANDER AND  ALLODERM (Left: Breast) PERIPHERAL NERVE DIVISION (Left: Axilla)     Patient location during evaluation: PACU Anesthesia Type: General Level of consciousness: awake and alert Pain management: pain level controlled Vital Signs Assessment: post-procedure vital signs reviewed and stable Respiratory status: spontaneous breathing, nonlabored ventilation, respiratory function stable and patient connected to nasal cannula oxygen Cardiovascular status: blood pressure returned to baseline and stable Postop Assessment: no apparent nausea or vomiting Anesthetic complications: no   No notable events documented.  Last Vitals:  Vitals:   08/18/22 1401 08/18/22 1451  BP: 112/82 129/86  Pulse: 84 93  Resp: 18 19  Temp:  36.8 C  SpO2: 96% 97%    Last Pain:  Vitals:   08/18/22 1451  TempSrc: Temporal  PainSc:                  Malahki Gasaway S

## 2022-08-18 NOTE — H&P (Signed)
Subjective:  Patient ID: Haley Evans is a 42 y.o. female.  HPI  7.5 months post op removal left chest tissue expander. Culture with Enterobacter, completed Bactrim course. Patient presents for left expander placement   Patient presented following screening detected left breast mass. She elected for bilateral NSM, Left SLN. Final pathology demonstrated 1.9 cm IDC margins negative, 0/3 SLN, ER/PR+, Her2-. Oncotype 13/4%. Patient stopped tamoxifen after 1 month due to HA. She restarted this Jan 2024.  Genetics negative.  Patient underwent bilateral NSM with immediate dual plane TE/ADM reconstruction with Drs. Toth and Dillingham. 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 Underwent debridement mastectomy flap and closure. Underwent subsequent left TE exchange and LD flap 07/18/21. At last visit noted to have opening IMF incision. She was treated by her primary surgeon with antibiotic course, local wound care. She undewent 8.31.23 removal replacement left TE for exposure left TE. Per chart review post op course has been complicated by cellulitis. Underwent aspiration 20 cc "cloudy serous fluid" on 10.10.23. Culture of this no growth. 20 cc fluid aspirated from left chest 10.16.23, no culture sent.  Prior to mastectomies C cup goal C cup.  PMH significant for bipolar d/o, not on medication for while, feels well controlled. Managed by PCP.  Quit smoking Feb 2023.  On pain contract, chronic Percocet use.  Review of Systems    Objective: Physical Exam  CV: normal heart sounds PULM: clear to auscultation bilateral  Chest: right chest expanded benign, animation present  Left chest soft tissue contracted, area of threatened ulceration soft tissue healed, soft   Assessment:  Left breast cancer UOQ ER+ S/p bilateral NSM, left SLN, dual plane TE/ADM (Flex HD) reconstruction Skin flap necrosis s/p debridement left mastectomy flap S/p removal/replacement left TE, left LD  flap S/p removal/replacement left TE S/p removal left TE   Plan:  Hold phentermine, [place], ibuprofen week prior to surgery.  Plan replacement left chest tissue expander, possible use of acellular dermis. Counseled will examine again intraop before using any ADM but noted at last surgery to have latissimus muscle largely over lower outer quadrant in area of skin paddle and no latissimus present over medial upper chest. If no latissimus in this area, I will likely use ADM for soft tissue support. Plan prepectoral placement as this muscle was returned to chest wall at time of latissimus flap.  Patient has experienced pain in back since time of surgery. Counseled I would not remove muscle. We have discussed that I cannot illicit any animation flap over chest. The operative note from this surgery does not mention where nerve divided nor if muscle origin detached. Will attempt to identify thoracodorsal nerve intraop and divide if I am able to find this, but I cannot assure her of this.  Reviewed OP surgery drains post operative limitations. I do not recommend any procedures to right chest at this surgery. However if we are able to successfully expand left chest, at time of implant placement patient can consider if she desires change to prepectoral position over right. Reviewed this would alleviate animation.  She continues to note that expander on right is moving around. I checked port position, and expander is in correct AP position. Her concern is size and that it is off to side. I agree the width of expander is narrower than desired and not centered beneath NAC. Counseled her ultimate implant needs to be wider than TE presently is to address this. Counseled additional fluid to expander may narrow base width  and exacerbate this problem. Counseled she has sufficient space with current expander volume to place implant and do not recommend additional fill.  Will provide one time Rx for Percocet and muscle  relaxant on day of surgery (she is on pain contract) as well as antibiotics.   Glenna Fellows, MD [facility] Plastic & Reconstructive Surgery  Office/ physician access line after hours [phone]    RIGHT Mentor Artoura Plus Smooth Ultra High Profile 455 cc TE RIGHT fill volume saline 570 ml

## 2022-08-18 NOTE — Anesthesia Procedure Notes (Signed)
Procedure Name: Intubation Date/Time: 08/18/2022 10:51 AM  Performed by: Thornell Mule, CRNAPre-anesthesia Checklist: Patient identified, Emergency Drugs available, Suction available and Patient being monitored Patient Re-evaluated:Patient Re-evaluated prior to induction Oxygen Delivery Method: Circle system utilized Preoxygenation: Pre-oxygenation with 100% oxygen Induction Type: IV induction Ventilation: Mask ventilation without difficulty Laryngoscope Size: Miller and 3 Grade View: Grade I Tube type: Oral Tube size: 7.0 mm Number of attempts: 1 Airway Equipment and Method: Stylet and Oral airway Placement Confirmation: ETT inserted through vocal cords under direct vision, positive ETCO2 and breath sounds checked- equal and bilateral Secured at: 21 cm Tube secured with: Tape Dental Injury: Teeth and Oropharynx as per pre-operative assessment

## 2022-08-21 ENCOUNTER — Encounter (HOSPITAL_BASED_OUTPATIENT_CLINIC_OR_DEPARTMENT_OTHER): Payer: Self-pay | Admitting: Plastic Surgery

## 2022-08-22 ENCOUNTER — Encounter: Payer: Self-pay | Admitting: Physical Medicine & Rehabilitation

## 2022-08-23 IMAGING — MR MR BREAST BILAT WO/W CM
8 of 12 series · 32 of 48 positions shown · IV contrast (gadavist)
Comparison: No prior MRI.

CLINICAL DATA: 40-year-old with recent diagnosis of grade 2
invasive ductal carcinoma involving the axillary tail of the LEFT
breast at the 2 o'clock location (coil clip). She had simultaneous
benign core needle biopsies at the 1 o'clock location (fibrocystic
changes-ribbon clip) and of calcifications in the UPPER OUTER
QUADRANT (columnar cell changes, fibrocystic changes-X clip). MRI is
performed prior to definitive treatment.

EXAM:
BILATERAL BREAST MRI WITH AND WITHOUT CONTRAST
TECHNIQUE: Multiplanar, multisequence MR images of both breasts were obtained
prior to and following the intravenous administration of 7 ml of
Gadavist.

[Series 2: t2_tirm_tra ipat (a-p) · axial · 3.0mm · 0.70mm/px · 1 of 55 slices shown]
[im 1/55]
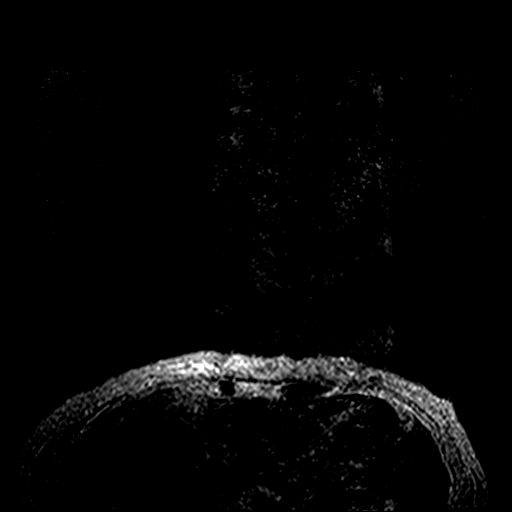

[Series 3: fl3d pre-cm no · axial · non-contrast · 1.2mm · 0.89mm/px · z∈[-73,+98]mm · 5 of 144 slices shown]
[im 1/144]
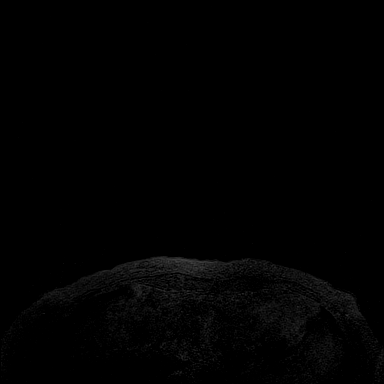
[im 36/144]
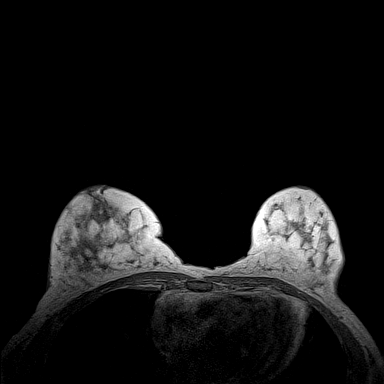
[im 72/144]
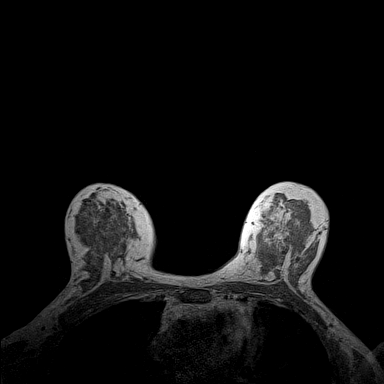
[im 108/144]
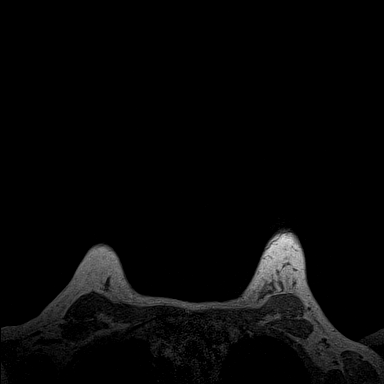
[im 144/144]
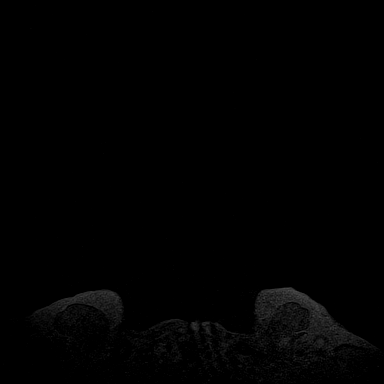

[Series 4: fl3d pre-cm · axial · non-contrast · 1.2mm · 0.89mm/px · z∈[-73,+98]mm · 5 of 144 slices shown]
[im 1/144]
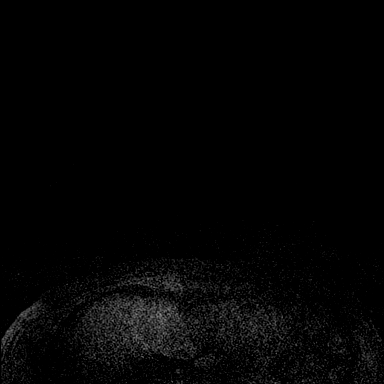
[im 36/144]
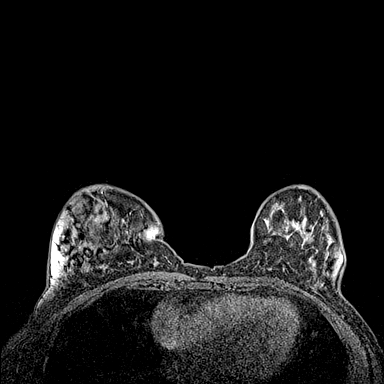
[im 72/144]
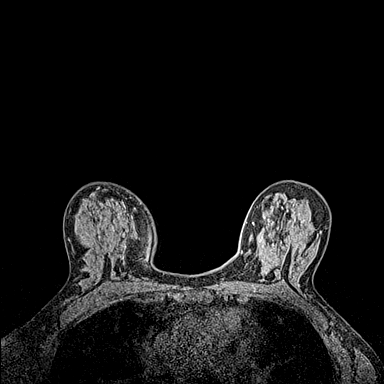
[im 108/144]
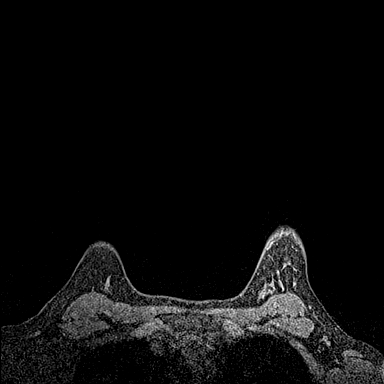
[im 144/144]
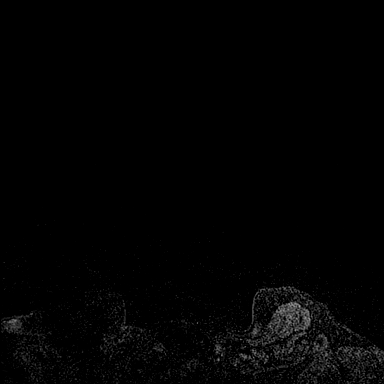

[Series 5: fl3d post-cm 20 · axial · 1.2mm · 0.89mm/px · z∈[-73,+98]mm · 5 of 144 slices shown (1 of 3)]
[im 1/144]
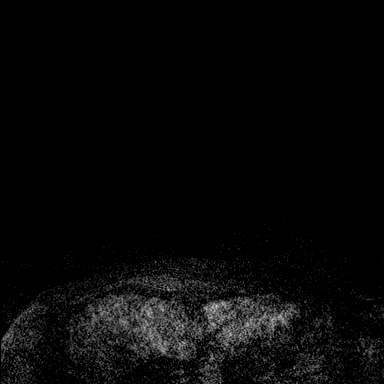
[im 36/144]
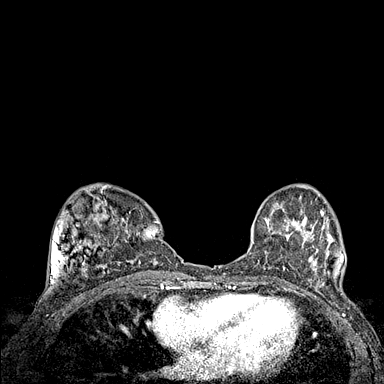
[im 72/144]
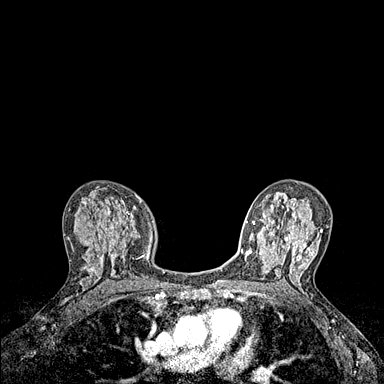
[im 108/144]
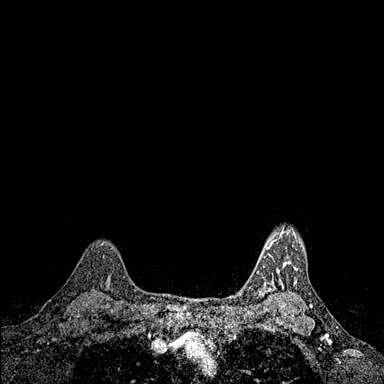
[im 144/144]
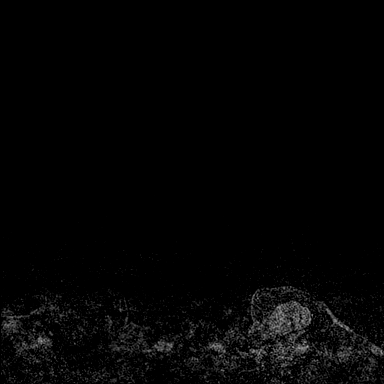

[Series 6: fl3d post-cm 20 · axial · 1.2mm · 0.89mm/px · z∈[-73,+98]mm · 5 of 144 slices shown (2 of 3)]
[im 1/144]
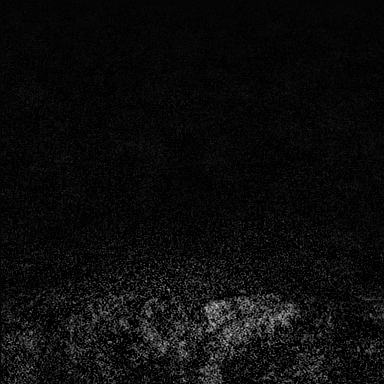
[im 36/144]
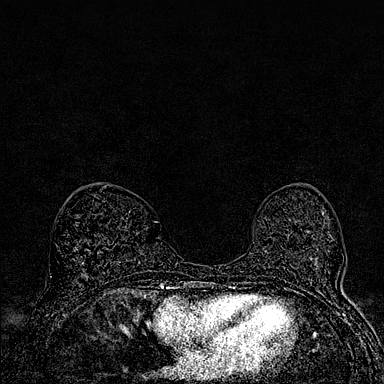
[im 72/144]
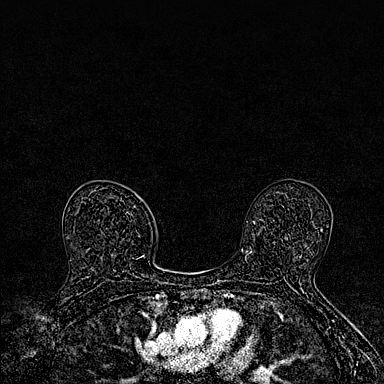
[im 108/144]
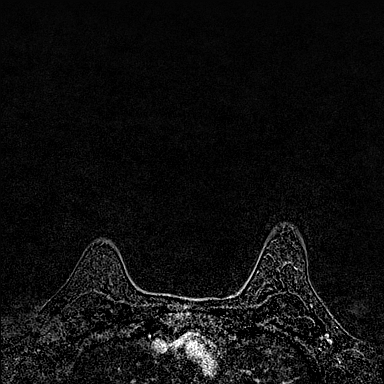
[im 144/144]
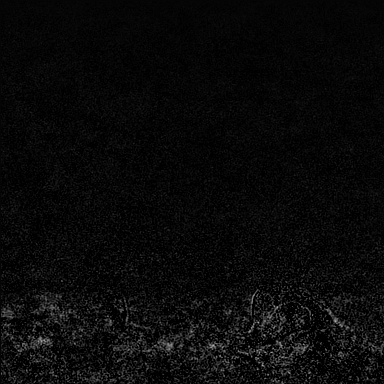

[Series 7: fl3d post-cm 20 · axial · 172.8mm · 0.89mm/px · 1 of 1 slices shown (3 of 3)]
[im 1/1]
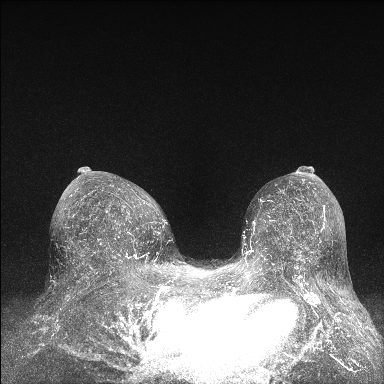

[Series 8: fl3d post-cm 3 · axial · 1.2mm · 0.89mm/px · z∈[-73,+98]mm · 6 of 144 slices shown (1 of 2)]
[im 1/144]
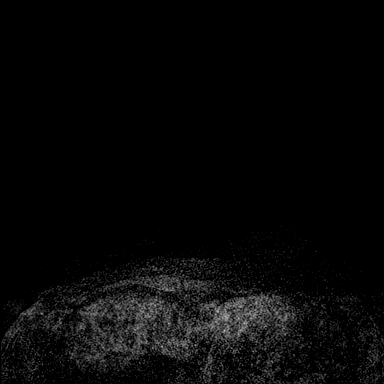
[im 29/144]
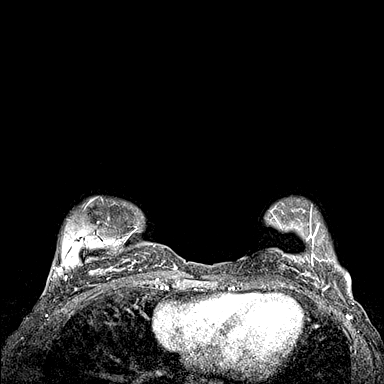
[im 58/144]
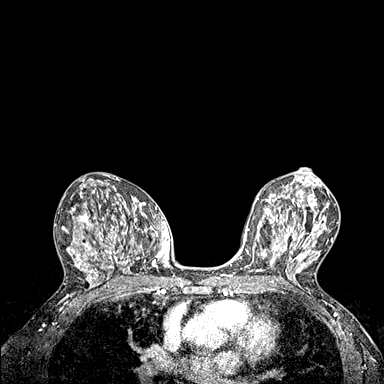
[im 86/144]
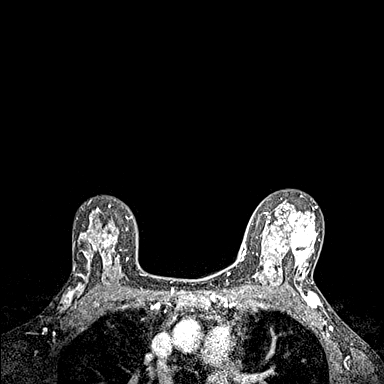
[im 115/144]
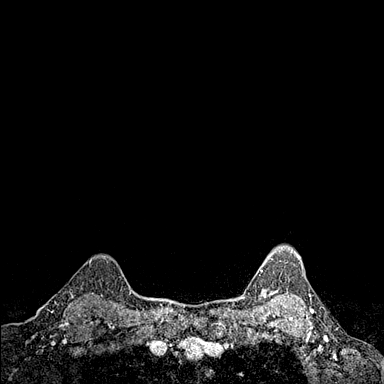
[im 144/144]
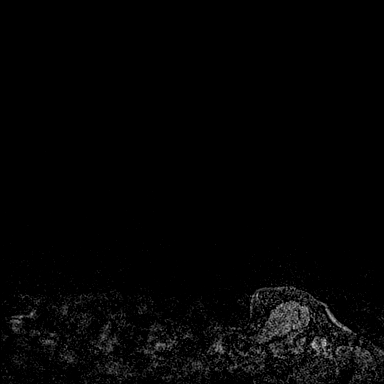

[Series 9: fl3d post-cm 3 · axial · 1.2mm · 0.89mm/px · z∈[-73,+29]mm · 4 of 144 slices shown (2 of 2)]
[im 1/144]
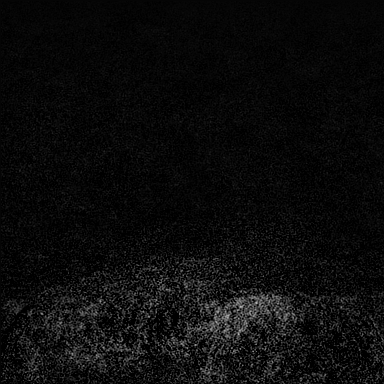
[im 29/144]
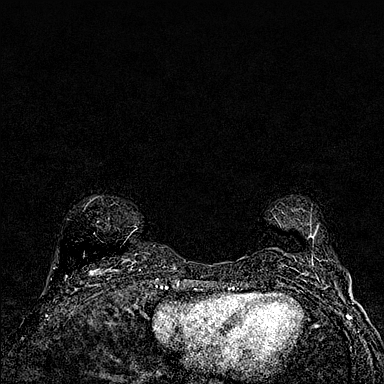
[im 58/144]
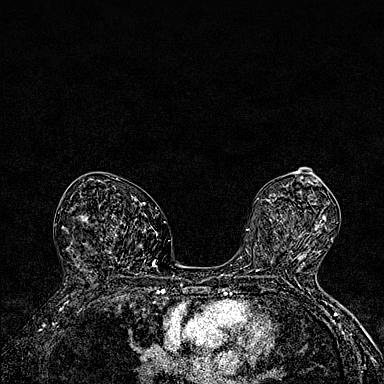
[im 86/144]
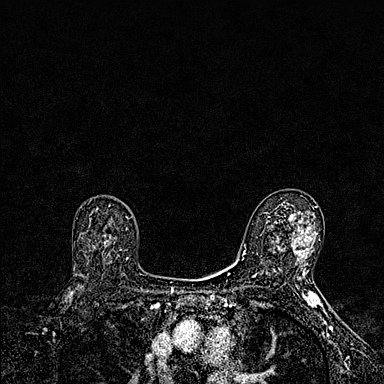

[32 of 48 positions shown; findings below may reference images not displayed]

Three-dimensional MR images were rendered by post-processing of the
original MR data on an independent workstation. The
three-dimensional MR images were interpreted, and findings are
reported in the following complete MRI report for this study. Three
dimensional images were evaluated at the independent interpreting
workstation using the DynaCAD thin client.
Prior mammography and breast ultrasounds,
most recently at the time of biopsies on 03/01/2021.
FINDINGS: Breast composition: d. Extreme fibroglandular tissue.

Background parenchymal enhancement: Moderate.

Right breast: No suspicious mass or abnormal enhancement. Multiple
small cysts (T2 hyperintense and T1 hypointense) throughout the
breast.

Left breast: Enhancing irregular mass in the axillary tail
associated with blooming artifact from the biopsy marking clip,
measuring approximately 1.2 x 1.7 x 1.9 cm (AP x transverse x
craniocaudal), demonstrating washout kinetics. No suspicious mass or
abnormal enhancement elsewhere. Multiple small cysts throughout the
breast.

Lymph nodes: No abnormal appearing lymph nodes.

Ancillary findings:  None.
IMPRESSION: 1. Biopsy-proven malignancy involving the axillary tail of the LEFT
breast with maximum measurement of 1.9 cm. No evidence of malignancy
elsewhere in the LEFT breast.
2. No MRI evidence of malignancy involving the RIGHT breast.
3. No pathologic lymphadenopathy.

RECOMMENDATION:
Treatment plan.

BI-RADS CATEGORY  6: Known biopsy-proven malignancy.

## 2022-08-25 MED ORDER — OXYCODONE-ACETAMINOPHEN 7.5-325 MG PO TABS: 1.0000 | ORAL_TABLET | Freq: Four times a day (QID) | ORAL | 0 refills | Status: DC | PRN

## 2022-09-01 MED ORDER — OXYCODONE-ACETAMINOPHEN 7.5-325 MG PO TABS: 1.0000 | ORAL_TABLET | Freq: Four times a day (QID) | ORAL | 0 refills | Status: DC | PRN

## 2022-09-07 ENCOUNTER — Ambulatory Visit: Payer: No Typology Code available for payment source | Admitting: Physical Medicine & Rehabilitation

## 2022-09-11 ENCOUNTER — Encounter: Payer: 59 | Attending: Physical Medicine & Rehabilitation | Admitting: Physical Medicine & Rehabilitation

## 2022-09-11 ENCOUNTER — Encounter: Payer: Self-pay | Admitting: Physical Medicine & Rehabilitation

## 2022-09-11 VITALS — BP 131/89 | HR 109 | Ht 69.0 in | Wt 166.0 lb

## 2022-09-11 DIAGNOSIS — F319 Bipolar disorder, unspecified: Secondary | ICD-10-CM | POA: Insufficient documentation

## 2022-09-11 DIAGNOSIS — R200 Anesthesia of skin: Secondary | ICD-10-CM | POA: Diagnosis present

## 2022-09-11 DIAGNOSIS — G8918 Other acute postprocedural pain: Secondary | ICD-10-CM | POA: Insufficient documentation

## 2022-09-11 DIAGNOSIS — G894 Chronic pain syndrome: Secondary | ICD-10-CM | POA: Insufficient documentation

## 2022-09-11 DIAGNOSIS — R202 Paresthesia of skin: Secondary | ICD-10-CM | POA: Insufficient documentation

## 2022-09-11 DIAGNOSIS — G4700 Insomnia, unspecified: Secondary | ICD-10-CM | POA: Insufficient documentation

## 2022-09-11 MED ORDER — TRAZODONE HCL 50 MG PO TABS: 25.0000 mg | ORAL_TABLET | Freq: Every evening | ORAL | 0 refills | Status: DC | PRN

## 2022-09-11 NOTE — Progress Notes (Addendum)
Subjective:    Patient ID: Haley Evans, female    DOB: December 28, 1980, 42 y.o.   MRN: [id number]  HPI   Haley Evans is a 43 year old female with past medical history of bipolar disorder, breast cancer here for chronic pain.  Patient had mastectomy 05/12/2021.  She then had left breast reconstruction with placement of tissue expander 06/15/2021.  On 07/18/2021 she had another surgery after having poor wound healing and had left breast reconstruction with latissimus flap and tissue expander by Dr. Ulice Bold.  Patient reports that she has had worsening pain since her surgeries.  Pain has has most severe after her third surgery. She has had worsening pain over her left chest back and shoulder.  She has burning and tingling pain over her back at the site of the latissimus flap.  She has dull aching pain around her scapula.  She also has aching in shooting pain around her chest.  She has tried Voltaren gel and Flexeril without much benefit.  She is working with physical therapy and completing dry needling with improvement to her pain.  She has also tried gabapentin 100 mg 3 times daily but reports it makes her feel foggy.  It does provide some benefit to the pain. She also did not take the medication regularly.  She also tried Tylenol and Advil with mild to moderate improvement in her pain when she takes them. She has a history of bipolar disorder. She reports she is not on any medications at this time although she feels her bipolar disorder has been stable recently.       Visit 11/21/2021 Haley. Evans is here for follow-up regarding her chronic postmastectomy pain. She had had removal and replacement of L breast tissue expander 10/27/21 by Dr. Ulice Bold.  She reports that her pain has significantly improved since this time.  She no longer has the burning and tingling pain over her chest back and shoulder.  She still has some aching pain in her left lateral back.  She is taking turmeric with benefit.  She  also sometimes takes Tylenol (less than 3g daily) or ibuprofen.  She was restarted gabapentin as patient reports she did not tolerate Lyrica very well and felt like it was affecting her mood.  She does not take the gabapentin regularly and will take 100 to 300 mg with each dose.  She is not having significant sedation with this medication.  She reports that her prior concerns of sedation with gabapentin were possibly related to other medications she was taking at the time.  Patient reports that plastic surgery has reordered her physical therapy and she plans to start this soon.   Interval history 02/07/2022 Haley. Evans is here for follow-up of her chronic mastectomy related pain.  On 11 3 she had a repeat surgery with removal of left chest tissue expander.  Pain was initially worsened after this procedure and she required a few tablets of oxycodone for acute pain control.  She still has 3 tablets left.  She reports she may have an additional surgery in about 3 months.  She is back in physical therapy and seeing them once a week.  They are working on dry needling and desensitization.  She reports her surgical incisions are healed.  She continues to have flareups of pain that last a few days.  She uses Tylenol and ibuprofen to control this pain.  Gabapentin 200 mg 3 times a day helps to control the neuropathic pain component.  She will sometimes take  300 mg if the pain is worse.  The gabapentin does make her more hungry.  She also takes tizanidine which also helps her pain.  She would like to avoid any antidepressant medications. Overall she feels that her pain is fairly well-controlled and would not like to make any medication changes at this time.   Interval history 03/17/12 Haley. Evans is here for chronic mastectomy related pain.  She recently had a fall where she was seen at the ER after she tripped and fell backwards resulting in a occipital laceration.  She continues to have severe pain around her lats,  chest, neck and shoulder.  She feels like the pain is significantly limiting her activities.  She does not want to do anything or go anywhere due to the pain.  Pain was not improved with Tylenol, Voltaren gel, Lyrica.  She would like to avoid antidepressant medications due to her bipolar history.  She is not using CBD Gummies at this time.  She does use Valium occasionally but would consider switching back to hydroxyzine.   Interval History 05/04/22 Haley Evans is here regarding her chronic chest wall and back pain.  She reports pain is much better controlled with oxycodone.  The medication has lasted her over a month although she is almost out of the medication.  She is not having any side effects.  She reports her friends and family have noticed that she is more active and she is able to participate better in social activities.  She also uses gabapentin that provides benefit.  She tries to use Tylenol or ibuprofen when pain is less severe and only uses oxycodone when pain is really bad.  Sometimes 5 mg oxycodone makes the pain tolerable, other times it is not strong enough and she will have poorly controlled pain.     Interval history 06/29/2022 Patient is here for follow-up regarding her chronic chest wall postmastectomy pain.  Her pain is better controlled with use of oxycodone.  She only uses it sparingly when her pain is very severe.  Some days she will do okay and other days she will have flares when the pain is much worse.  Overall the pain is more tolerable.  She also feels that the pain is getting a little bit better gradually.  She says she is planned for additional surgery this summer.  Additionally she has been having some milder pain in her right trap and shoulder.  This has been going on for about 2 weeks.  She feels that her right trapezius is tight.   Interval history 09/11/22 Patient is here for follow-up regarding her chronic chest wall postmastectomy pain.  Patient had increased pain after  recovering from her recent breast reconstruction surgery with placement of tissue expander and AlloDerm a few weeks ago.  She has been on her usual dose of Percocet 5 milligrams 1 to 1-1/2 tabs after using the 7.5 mg 1 to 1-1/2 tabs dose for a few weeks.  Patient reports that this week her pain has been better overall.  She does have occasional tingling in both hands that will occur intermittently.    Pain Inventory Average Pain 7 Pain Right Now 8 My pain is dull, stabbing, tingling, and aching  In the last 24 hours, has pain interfered with the following? General activity 7 Relation with others 7 Enjoyment of life 7 What TIME of day is your pain at its worst? evening and night Sleep (in general) Fair  Pain is worse with: inactivity and  some activites Pain improves with: medication Relief from Meds: 8  Family History  Problem Relation Age of Onset   Bladder Cancer Maternal Grandmother 81   Pancreatic cancer Maternal Grandfather 30   Social History   Socioeconomic History   Marital status: Single    Spouse name: Not on file   Number of children: Not on file   Years of education: Not on file   Highest education level: Not on file  Occupational History   Occupation: Conservator, museum/gallery    Comment: insurance company  Tobacco Use   Smoking status: Former    Current packs/day: 0.00    Types: Cigarettes    Start date: 04/12/2001    Quit date: 04/12/2021    Years since quitting: 1.4   Smokeless tobacco: Never  Vaping Use   Vaping status: Never Used  Substance and Sexual Activity   Alcohol use: Not Currently    Alcohol/week: 5.0 standard drinks of alcohol    Types: 5 Standard drinks or equivalent per week   Drug use: No   Sexual activity: Not on file    Comment: Copper 10 yr IUD  Other Topics Concern   Not on file  Social History Narrative   Not on file   Social Determinants of Health   Financial Resource Strain: Not on file  Food Insecurity: Low Risk  (05/02/2022)   [facility]   Food vital sign    Within the past 12 months, you worried that your food would run out before you got money to buy more: Never true    Within the past 12 months, the food you bought just didn't last and you didn't have money to get more. : Never true  Transportation Needs: Not on file (05/02/2022)  Physical Activity: Not on file  Stress: Not on file  Social Connections: Not on file   Past Surgical History:  Procedure Laterality Date   ARM WOUND REPAIR / CLOSURE Right    fractured arm   BREAST IMPLANT REMOVAL Left 12/30/2021   Procedure: REMOVAL LEFT CHEST TISSUE EXPANDER;  Surgeon: Glenna Fellows, MD;  Location: [facility];  Service: Plastics;  Laterality: Left;   BREAST RECONSTRUCTION WITH PLACEMENT OF TISSUE EXPANDER AND ALLODERM Left 08/18/2022   Procedure: BREAST RECONSTRUCTION WITH PLACEMENT OF TISSUE EXPANDER AND  ALLODERM;  Surgeon: Glenna Fellows, MD;  Location: [facility];  Service: Plastics;  Laterality: Left;   BREAST RECONSTRUCTION WITH PLACEMENT OF TISSUE EXPANDER AND FLEX HD (ACELLULAR HYDRATED DERMIS) Left 06/15/2021   Procedure: REPLACEMENT OF TISSUE EXPANDER AND FLEX HD (ACELLULAR HYDRATED DERMIS);  Surgeon: Peggye Form, DO;  [facility];  Service: Plastics;  Laterality: Left;   CESAREAN SECTION     DEBRIDEMENT AND CLOSURE WOUND Left 06/15/2021   Procedure: DEBRIDEMENT AND CLOSURE LEFT BREAST WOUND;  Surgeon: Peggye Form, DO;  [facility];  Service: Plastics;  Laterality: Left;  1 hour   LATISSIMUS FLAP TO BREAST Left 07/18/2021   Procedure: LATISSIMUS FLAP TO BREAST;  Surgeon: Peggye Form, DO;  [facility];  Service: Plastics;  Laterality: Left;   NIPPLE SPARING MASTECTOMY Right 05/12/2021   Procedure: RIGHT NIPPLE SPARING MASTECTOMY;  Surgeon: Griselda Miner, MD;  Location: [facility];  Service: General;  Laterality: Right;   NIPPLE SPARING MASTECTOMY WITH  SENTINEL LYMPH NODE BIOPSY Left 05/12/2021   Procedure: LEFT NIPPLE SPARING MASTECTOMY WITH LEFT SENTINEL LYMPH NODE BIOPSY;  Surgeon: Chevis Pretty  III, MD;  Location: [facility];  Service: General;  Laterality: Left;   PRE-PECTORAL BREAST RECON W/ PLACEMENT OF TISSUE EXPANDERS, DERMAL GRAFT Bilateral 05/12/2021   Procedure: IMMEDIATE BILATERAL BREAST RECONSTRUCTION WITH PLACEMENT OF TISSUE EXPANDERS AND FLEX HD;  Surgeon: Peggye Form, DO;  Location: [facility];  Service: Plastics;  Laterality: Bilateral;   REMOVAL OF TISSUE EXPANDER Left 10/27/2021   Procedure: Left breast seroma drainage with tissue expander exchange;  Surgeon: Peggye Form, DO;  [facility];  Service: Plastics;  Laterality: Left;   TISSUE EXPANDER PLACEMENT Left 07/18/2021   Procedure: REMOVAL AND REPLACEMENT OF TISSUE EXPANDER;  Surgeon: Peggye Form, DO;  [facility];  Service: Plastics;  Laterality: Left;  3 hours   WOUND EXPLORATION Left 08/18/2022   Procedure: PERIPHERAL NERVE DIVISION;  Surgeon: Glenna Fellows, MD;  Location: [facility];  Service: Plastics;  Laterality: Left;   Past Surgical History:  Procedure Laterality Date   ARM WOUND REPAIR / CLOSURE Right    fractured arm   BREAST IMPLANT REMOVAL Left 12/30/2021   Procedure: REMOVAL LEFT CHEST TISSUE EXPANDER;  Surgeon: Glenna Fellows, MD;  Location: [facility];  Service: Plastics;  Laterality: Left;   BREAST RECONSTRUCTION WITH PLACEMENT OF TISSUE EXPANDER AND ALLODERM Left 08/18/2022   Procedure: BREAST RECONSTRUCTION WITH PLACEMENT OF TISSUE EXPANDER AND  ALLODERM;  Surgeon: Glenna Fellows, MD;  Location: [facility];  Service: Plastics;  Laterality: Left;   BREAST RECONSTRUCTION WITH PLACEMENT OF TISSUE EXPANDER AND FLEX HD (ACELLULAR HYDRATED DERMIS) Left 06/15/2021   Procedure: REPLACEMENT OF TISSUE EXPANDER AND FLEX HD (ACELLULAR HYDRATED DERMIS);   Surgeon: Peggye Form, DO;  [facility];  Service: Plastics;  Laterality: Left;   CESAREAN SECTION     DEBRIDEMENT AND CLOSURE WOUND Left 06/15/2021   Procedure: DEBRIDEMENT AND CLOSURE LEFT BREAST WOUND;  Surgeon: Peggye Form, DO;  [facility];  Service: Plastics;  Laterality: Left;  1 hour   LATISSIMUS FLAP TO BREAST Left 07/18/2021   Procedure: LATISSIMUS FLAP TO BREAST;  Surgeon: Peggye Form, DO;  [facility];  Service: Plastics;  Laterality: Left;   NIPPLE SPARING MASTECTOMY Right 05/12/2021   Procedure: RIGHT NIPPLE SPARING MASTECTOMY;  Surgeon: Griselda Miner, MD;  Location: [facility];  Service: General;  Laterality: Right;   NIPPLE SPARING MASTECTOMY WITH SENTINEL LYMPH NODE BIOPSY Left 05/12/2021   Procedure: LEFT NIPPLE SPARING MASTECTOMY WITH LEFT SENTINEL LYMPH NODE BIOPSY;  Surgeon: Griselda Miner, MD;  Location: [facility];  Service: General;  Laterality: Left;   PRE-PECTORAL BREAST RECON W/ PLACEMENT OF TISSUE EXPANDERS, DERMAL GRAFT Bilateral 05/12/2021   Procedure: IMMEDIATE BILATERAL BREAST RECONSTRUCTION WITH PLACEMENT OF TISSUE EXPANDERS AND FLEX HD;  Surgeon: Peggye Form, DO;  Location: [facility];  Service: Plastics;  Laterality: Bilateral;   REMOVAL OF TISSUE EXPANDER Left 10/27/2021   Procedure: Left breast seroma drainage with tissue expander exchange;  Surgeon: Peggye Form, DO;  [facility];  Service: Plastics;  Laterality: Left;   TISSUE EXPANDER PLACEMENT Left 07/18/2021   Procedure: REMOVAL AND REPLACEMENT OF TISSUE EXPANDER;  Surgeon: Peggye Form, DO;  [facility];  Service: Plastics;  Laterality: Left;  3 hours   WOUND EXPLORATION Left 08/18/2022   Procedure: PERIPHERAL NERVE DIVISION;  Surgeon: Glenna Fellows, MD;  Location: [facility];  Service: Plastics;  Laterality: Left;  Past Medical History:  Diagnosis Date   Anemia    Anxiety     Bipolar 2 disorder (HCC)    Cancer (HCC)    breast   Depression    Family history of adverse reaction to anesthesia    aunt woke up during hip replacement   Seizure (HCC)    vs syncopy, age 72 and 12/2017, Seen by neurologist, a definitive diagnosis was not made. Pt reports having seizure aroudn 2021   BP 131/89   Pulse (!) 109   Ht 5\' 9"  (1.753 m)   Wt 166 lb (75.3 kg)   LMP 07/31/2022 (Approximate) Comment: negative UPT  SpO2 97%   BMI 24.51 kg/m   Opioid Risk Score:   Fall Risk Score:  `1  Depression screen [facility] 2/9     02/07/2022   11:44 AM 11/21/2021    2:02 PM 09/29/2021    1:46 PM  Depression screen PHQ 2/9  Decreased Interest 0 0 1  Down, Depressed, Hopeless 0 0 0  PHQ - 2 Score 0 0 1  Altered sleeping   1  Tired, decreased energy   2  Change in appetite   2  Feeling bad or failure about yourself    0  Trouble concentrating   1  Moving slowly or fidgety/restless   0  Suicidal thoughts   0  PHQ-9 Score   7  Difficult doing work/chores   Somewhat difficult      Review of Systems  Musculoskeletal:  Positive for back pain and neck pain.       Rt shoulder B/L arm hand pain  All other systems reviewed and are negative.      Objective:   Physical Exam  Gen: no distress, normal appearing HEENT: oral mucosa pink and moist, NCAT Chest: normal effort, normal rate of breathing Abd: soft, non-distended Ext: no edema Psych: pleasant, normal affect Skin: intact Neuro: Alert , , follows commands, cranial nerves II through XII grossly intact, sensation intact to light touch in all 4 extremities Strength 5 out of 5 in all 4 extremities Tinel's negative at bilateral wrist and elbow Phalen's resulted in some left-sided tingling around her fifth digit Musculoskeletal: She continues to have tenderness noted over left anterior chest, upper arm S/p latissimus flap surgery on the left, tenderness in over lateral left posterior back Patient is able to ABduct her left  arm to about 90 degrees, pain beyond this point.  Patient reports her surgeon feels like this will be a chronic limitation due to the type of surgery she had previously.      Assessment & Plan:    Post mastectomy and reconstruction pain, gradually improving.  She had removal of eft chest tissue expander on the left on 12/30/21. -Continue gabapentin 100 -300 mg 3 times daily as needed, dose limited by sedation -Continue tizanidine PRN  Continue ibuprofen and tylenol PRN- discussed no more than 3000mg  tylenol from all sources a day -Continue tumaric  -Continue oxycodone current dose 1-1.5 tabs up to Q12h PRN, 60 tabs ordered, patient advised to call when she is in need of a refill -Pill count appears consistent -Discussed increased risks of using opioid medications while using alprazolam -PDMP reviewed -Patient reports additional surgery scheduled in about a month   Right shoulder and trapezius trapezius pain -Possibly related to postural/muscular imbalances -Continue Voltaren gel -Discussed trying Thera cane for trapezius prior visit -Consider x-ray for further evaluation if worsens -Zynax cryo heat ordered previously-she decided not to  use this due to surgery is concerned about insensate skin, I do think it is a good idea to avoid use over any insensate areas to avoid burn  Hx of bipolar disorder not on medications -She reports her mood has been stable.  -She discussed again that she would like to avoid antidepressant class medications   Insomnia -Will order trazodone 25 mg nightly as needed, discussed risks and benefits  Bilateral hand tingling -No significant motor or sensory deficits was noted. -She did have some tingling more on her ulnar distribution with Phalen's test on the left. -Advised to try wrist splint at night -Consider EMG/nerve conduction study at later time if symptoms worsen or do not improve   11/21/22 called pt, says she can take 2 of her oxycodone 5mg  Q6h  until she gets her higher dose of oxycodone.   9/25 called pt, pain continues to be poorly continued,  she has her 7.5 mg percocet dose, OK to take 2 tabs Q6h instead of 1.5 tabs Q6h

## 2022-09-14 LAB — TOXASSURE SELECT,+ANTIDEPR,UR

## 2022-09-18 ENCOUNTER — Ambulatory Visit: Payer: 59 | Attending: General Surgery | Admitting: Rehabilitation

## 2022-09-18 ENCOUNTER — Encounter: Payer: Self-pay | Admitting: Rehabilitation

## 2022-09-18 DIAGNOSIS — Z483 Aftercare following surgery for neoplasm: Secondary | ICD-10-CM | POA: Insufficient documentation

## 2022-09-18 DIAGNOSIS — C50412 Malignant neoplasm of upper-outer quadrant of left female breast: Secondary | ICD-10-CM | POA: Insufficient documentation

## 2022-09-18 DIAGNOSIS — Z170 Estrogen receptor positive status [ER+]: Secondary | ICD-10-CM | POA: Insufficient documentation

## 2022-09-18 NOTE — Therapy (Signed)
OUTPATIENT PHYSICAL THERAPY SOZO SCREENING NOTE   Patient Name: Haley Evans MRN: [id number] DOB:[birth year], 42 y.o., female Today's Date: 09/18/2022  PCP: Iona Hansen, NP REFERRING PROVIDER: Griselda Miner, MD     Past Medical History:  Diagnosis Date   Anemia    Anxiety    Bipolar 2 disorder (HCC)    Cancer (HCC)    breast   Depression    Family history of adverse reaction to anesthesia    aunt woke up during hip replacement   Seizure (HCC)    vs syncopy, age 31 and 12/2017, Seen by neurologist, a definitive diagnosis was not made. Pt reports having seizure aroudn 2021   Past Surgical History:  Procedure Laterality Date   ARM WOUND REPAIR / CLOSURE Right    fractured arm   BREAST IMPLANT REMOVAL Left 12/30/2021   Procedure: REMOVAL LEFT CHEST TISSUE EXPANDER;  Surgeon: Glenna Fellows, MD;  Location: [facility];  Service: Plastics;  Laterality: Left;   BREAST RECONSTRUCTION WITH PLACEMENT OF TISSUE EXPANDER AND ALLODERM Left 08/18/2022   Procedure: BREAST RECONSTRUCTION WITH PLACEMENT OF TISSUE EXPANDER AND  ALLODERM;  Surgeon: Glenna Fellows, MD;  Location: [facility];  Service: Plastics;  Laterality: Left;   BREAST RECONSTRUCTION WITH PLACEMENT OF TISSUE EXPANDER AND FLEX HD (ACELLULAR HYDRATED DERMIS) Left 06/15/2021   Procedure: REPLACEMENT OF TISSUE EXPANDER AND FLEX HD (ACELLULAR HYDRATED DERMIS);  Surgeon: Peggye Form, DO;  [facility];  Service: Plastics;  Laterality: Left;   CESAREAN SECTION     DEBRIDEMENT AND CLOSURE WOUND Left 06/15/2021   Procedure: DEBRIDEMENT AND CLOSURE LEFT BREAST WOUND;  Surgeon: Peggye Form, DO;  [facility];  Service: Plastics;  Laterality: Left;  1 hour   LATISSIMUS FLAP TO BREAST Left 07/18/2021   Procedure: LATISSIMUS FLAP TO BREAST;  Surgeon: Peggye Form, DO;  [facility];  Service: Plastics;  Laterality: Left;   NIPPLE SPARING MASTECTOMY Right 05/12/2021    Procedure: RIGHT NIPPLE SPARING MASTECTOMY;  Surgeon: Griselda Miner, MD;  Location: [facility];  Service: General;  Laterality: Right;   NIPPLE SPARING MASTECTOMY WITH SENTINEL LYMPH NODE BIOPSY Left 05/12/2021   Procedure: LEFT NIPPLE SPARING MASTECTOMY WITH LEFT SENTINEL LYMPH NODE BIOPSY;  Surgeon: Griselda Miner, MD;  Location: [facility];  Service: General;  Laterality: Left;   PRE-PECTORAL BREAST RECON W/ PLACEMENT OF TISSUE EXPANDERS, DERMAL GRAFT Bilateral 05/12/2021   Procedure: IMMEDIATE BILATERAL BREAST RECONSTRUCTION WITH PLACEMENT OF TISSUE EXPANDERS AND FLEX HD;  Surgeon: Peggye Form, DO;  Location: [facility];  Service: Plastics;  Laterality: Bilateral;   REMOVAL OF TISSUE EXPANDER Left 10/27/2021   Procedure: Left breast seroma drainage with tissue expander exchange;  Surgeon: Peggye Form, DO;  [facility];  Service: Plastics;  Laterality: Left;   TISSUE EXPANDER PLACEMENT Left 07/18/2021   Procedure: REMOVAL AND REPLACEMENT OF TISSUE EXPANDER;  Surgeon: Peggye Form, DO;  [facility];  Service: Plastics;  Laterality: Left;  3 hours   WOUND EXPLORATION Left 08/18/2022   Procedure: PERIPHERAL NERVE DIVISION;  Surgeon: Glenna Fellows, MD;  Location: [facility];  Service: Plastics;  Laterality: Left;   Patient Active Problem List   Diagnosis Date Noted   History of breast cancer 12/30/2021   Admission for removal of tissue expander of breast, unspecified laterality 07/18/2021   Acquired absence of left breast 07/18/2021   Open wound of left breast  07/15/2021   Breast cancer (HCC) 05/12/2021   Genetic testing 03/18/2021   Family history of pancreatic cancer 03/10/2021   Malignant neoplasm of upper-outer quadrant of left breast in female, estrogen receptor positive (HCC) 03/07/2021   Anxiety 06/25/2011    REFERRING DIAG: left breast cancer at risk for lymphedema  THERAPY DIAG:  No  diagnosis found.  PERTINENT HISTORY: Bilateral nipple sparing mastectomy and left sentinel node biopsy (3 negative nodes) on 05/12/2021 with expanders placed for reconstruction. It is ER/PR positive and HER2 negative with a Ki67 of 1%. Muscle flap reconstruction on left performed 07/19/21 due to complications. 10/27/2021 Left tissue expander exchange with seroma drainage. 12/30/2021 - left tissue expander removed.   PRECAUTIONS: left UE Lymphedema risk, None  SUBJECTIVE: Pt returns for her 3 month L-dex screen.   PAIN:  Are you having pain? No  SOZO SCREENING: Patient was assessed today using the SOZO machine to determine the lymphedema index score. This was compared to her baseline score. It was determined that she is within the recommended range when compared to her baseline and no further action is needed at this time. She will continue SOZO screenings. These are done every 3 months for 2 years post operatively followed by every 6 months for 2 years, and then annually.       Haley Evans, PT 09/18/2022, 4:20 PM

## 2022-10-02 ENCOUNTER — Encounter: Payer: Self-pay | Admitting: Physical Medicine & Rehabilitation

## 2022-10-03 MED ORDER — OXYCODONE-ACETAMINOPHEN 5-325 MG PO TABS: 1.0000 | ORAL_TABLET | Freq: Two times a day (BID) | ORAL | 0 refills | Status: DC | PRN

## 2022-10-26 ENCOUNTER — Encounter: Payer: Self-pay | Admitting: Physical Medicine & Rehabilitation

## 2022-10-26 NOTE — H&P (Signed)
Subjective:      HPI   2.5 mo post op left breast reconstruction with tissue expander. Scheduled for bilateral implant exchange next month.    Patient presented following screening detected left breast mass. She elected for bilateral NSM, Left SLN. Final pathology demonstrated 1.9 cm IDC margins negative, 0/3 SLN, ER/PR+, Her2-. Oncotype 13/4%. Patient stopped tamoxifen after 1 month due to  HA. She restarted this Jan 2024.   Genetics negative.    Patient underwent bilateral NSM with immediate dual plane TE/ADM reconstruction with Drs. Toth and Dillingham. 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 Underwent debridement mastectomy flap and closure. Underwent subsequent left TE exchange  and LD flap 07/18/21. At last visit noted to have opening IMF incision. She was treated by her primary surgeon with antibiotic course, local wound care. She undewent 8.31.23 removal replacement left TE for exposure left TE. Per chart review post op course  has been complicated by cellulitis. Underwent aspiration 20 cc "cloudy serous fluid" on 10.10.23. Culture of this no growth. 20 cc fluid aspirated from left chest 10.16.23, no culture sent.     Patient ultimately underwent removal left chest TE for threatened exposure/ulceration. Culture with Enterobacter, completed Bactrim course.    Prior to mastectomies C cup, goal C cup. Right mastectomy 468 g Left mastectomy 500 g   PMH significant for bipolar d/o, not on medication for while, feels well controlled. Managed by PCP.   Quit smoking Feb 2023.   On pain contract, chronic Percocet use.     Review of Systems          Objective:    Physical Exam    CV: normal heart sounds normal rate Pulm: clear to auscultation bilateral   Chest: right chest expanded benign, animation present  Left chest expanded scar maturing   SN to nipple R 21 L 23 cm Nipple to scar R 4 cm fold 6 cm to fold 6 cm       Assessment:      Left breast cancer UOQ  ER+ S/p bilateral NSM, left SLN, dual plane TE/ADM (Flex HD) reconstruction Skin flap necrosis s/p debridement left mastectomy flap S/p removal/replacement left TE, left LD flap S/p removal/replacement left TE S/p removal left TE S/p left breast reconstruction with TE/ADM (Alloderm), division thoracodorsal nerve        Plan:      Pictures today.   Plan removal bilateral chest tissue expanders and placement silicone implants.   Reviewed saline vs silicone, shaped v round. As in prepectoral position I recommend HCG or capacity filled silicone implants to reduce risk visible rippling. Reviewed imaging surveillance for rupture with silicone implants. Reviewed examples for 4th generation, capacity filled 4th generation, and HCG implants vs saline implants. Counseled cannot assure cup size, implant selection determined in part by width chest.  Discussed in setting saline rupture can leave shell in place, can rupture opposite implant if no further reconstruction desired in future. Discussed intra and extracapsular rupture. Patient has elected for silicone, plan smooth round. Completed Eugenie Filler physician patient checklist.    Over right chest, patient has elected for conversion to prepectoral position. Plan additional use ADM, drains on this side. Benefit of conversion would be no animation, risks include rippling in social area of chest.    Reviewed purpose fat grafting to aid with contour, thicken flaps to prevent rippling. Reviewed variable take graft, may need to repeat, fat necrosis that presents as palpable masses. Reviewed donor site pain need for compression.  Given above plan to covert to prepectoral position and use of ADM over right, will defer this for now.    Rx for Bactrim given. On pain contract-she will receive all meds from pain provider.  Glenna Fellows, MD [facility] Plastic & Reconstructive Surgery  Office/ physician access line after hours [phone]     RIGHT Mentor  Artoura Plus Smooth Ultra High Profile 455 cc TE  RIGHT fill volume saline 570 ml LEFT Natrelle [id number] 400 ml tissue expander  fill volume 375  ml saline

## 2022-10-30 MED ORDER — TIZANIDINE HCL 2 MG PO CAPS: 2.0000 mg | ORAL_CAPSULE | Freq: Three times a day (TID) | ORAL | 0 refills | Status: DC | PRN

## 2022-11-13 ENCOUNTER — Other Ambulatory Visit: Payer: Self-pay

## 2022-11-13 ENCOUNTER — Ambulatory Visit: Payer: 59 | Admitting: Physical Medicine & Rehabilitation

## 2022-11-13 ENCOUNTER — Encounter (HOSPITAL_BASED_OUTPATIENT_CLINIC_OR_DEPARTMENT_OTHER): Payer: Self-pay | Admitting: Plastic Surgery

## 2022-11-16 MED ORDER — OXYCODONE-ACETAMINOPHEN 5-325 MG PO TABS: 1.0000 | ORAL_TABLET | Freq: Two times a day (BID) | ORAL | 0 refills | Status: DC | PRN

## 2022-11-16 MED ORDER — CHLORHEXIDINE GLUCONATE CLOTH 2 % EX PADS: 6.0000 | MEDICATED_PAD | Freq: Once | CUTANEOUS | Status: DC

## 2022-11-16 NOTE — Progress Notes (Signed)

## 2022-11-21 ENCOUNTER — Encounter (HOSPITAL_BASED_OUTPATIENT_CLINIC_OR_DEPARTMENT_OTHER): Payer: Self-pay | Admitting: Plastic Surgery

## 2022-11-21 ENCOUNTER — Other Ambulatory Visit: Payer: Self-pay

## 2022-11-21 ENCOUNTER — Ambulatory Visit (HOSPITAL_BASED_OUTPATIENT_CLINIC_OR_DEPARTMENT_OTHER)
Admission: RE | Admit: 2022-11-21 | Discharge: 2022-11-21 | Disposition: A | Discharge status: Home / Self Care | Payer: No Typology Code available for payment source | Source: Ambulatory Visit | Attending: Plastic Surgery | Admitting: Plastic Surgery

## 2022-11-21 ENCOUNTER — Ambulatory Visit (HOSPITAL_BASED_OUTPATIENT_CLINIC_OR_DEPARTMENT_OTHER): Payer: No Typology Code available for payment source | Admitting: Anesthesiology

## 2022-11-21 ENCOUNTER — Encounter (HOSPITAL_BASED_OUTPATIENT_CLINIC_OR_DEPARTMENT_OTHER)
Admission: RE | Disposition: A | Discharge status: Home / Self Care | Payer: Self-pay | Source: Ambulatory Visit | Attending: Plastic Surgery

## 2022-11-21 DIAGNOSIS — Z853 Personal history of malignant neoplasm of breast: Secondary | ICD-10-CM | POA: Insufficient documentation

## 2022-11-21 DIAGNOSIS — F319 Bipolar disorder, unspecified: Secondary | ICD-10-CM | POA: Insufficient documentation

## 2022-11-21 DIAGNOSIS — Z7985 Long-term (current) use of injectable non-insulin antidiabetic drugs: Secondary | ICD-10-CM | POA: Diagnosis not present

## 2022-11-21 DIAGNOSIS — Z9013 Acquired absence of bilateral breasts and nipples: Secondary | ICD-10-CM | POA: Insufficient documentation

## 2022-11-21 DIAGNOSIS — Z87891 Personal history of nicotine dependence: Secondary | ICD-10-CM | POA: Diagnosis not present

## 2022-11-21 DIAGNOSIS — Z9011 Acquired absence of right breast and nipple: Secondary | ICD-10-CM

## 2022-11-21 DIAGNOSIS — Z421 Encounter for breast reconstruction following mastectomy: Secondary | ICD-10-CM | POA: Insufficient documentation

## 2022-11-21 DIAGNOSIS — Z79891 Long term (current) use of opiate analgesic: Secondary | ICD-10-CM | POA: Diagnosis not present

## 2022-11-21 DIAGNOSIS — Z01818 Encounter for other preprocedural examination: Secondary | ICD-10-CM

## 2022-11-21 HISTORY — PX: REMOVAL OF BILATERAL TISSUE EXPANDERS WITH PLACEMENT OF BILATERAL BREAST IMPLANTS: SHX6431

## 2022-11-21 HISTORY — PX: BREAST RECONSTRUCTION WITH PLACEMENT OF TISSUE EXPANDER AND ALLODERM: SHX6805

## 2022-11-21 LAB — POCT PREGNANCY, URINE: Preg Test, Ur: NEGATIVE

## 2022-11-21 SURGERY — REMOVAL, TISSUE EXPANDER, BREAST, BILATERAL, WITH BILATERAL IMPLANT IMPLANT INSERTION
Anesthesia: General | Site: Chest | Laterality: Right

## 2022-11-21 MED ORDER — ACETAMINOPHEN 500 MG PO TABS
1000.0000 mg | ORAL_TABLET | ORAL | Status: AC
  Administered 2022-11-21: 1000 mg via ORAL

## 2022-11-21 MED ORDER — CELECOXIB 200 MG PO CAPS
ORAL_CAPSULE | ORAL | Status: AC
  Filled 2022-11-21: qty 1

## 2022-11-21 MED ORDER — AMISULPRIDE (ANTIEMETIC) 5 MG/2ML IV SOLN
10.0000 mg | Freq: Once | INTRAVENOUS | Status: AC | PRN
  Administered 2022-11-21: 10 mg via INTRAVENOUS

## 2022-11-21 MED ORDER — GABAPENTIN 300 MG PO CAPS
ORAL_CAPSULE | ORAL | Status: AC
  Filled 2022-11-21: qty 1

## 2022-11-21 MED ORDER — OXYCODONE HCL 5 MG PO TABS
5.0000 mg | ORAL_TABLET | Freq: Once | ORAL | Status: AC | PRN
  Administered 2022-11-21: 5 mg via ORAL

## 2022-11-21 MED ORDER — MIDAZOLAM HCL 2 MG/2ML IJ SOLN
INTRAMUSCULAR | Status: AC
  Filled 2022-11-21: qty 2

## 2022-11-21 MED ORDER — ONDANSETRON HCL 4 MG/2ML IJ SOLN
INTRAMUSCULAR | Status: DC | PRN
  Administered 2022-11-21: 4 mg via INTRAVENOUS

## 2022-11-21 MED ORDER — FENTANYL CITRATE (PF) 100 MCG/2ML IJ SOLN
INTRAMUSCULAR | Status: AC
  Filled 2022-11-21: qty 2

## 2022-11-21 MED ORDER — ONDANSETRON HCL 4 MG/2ML IJ SOLN
INTRAMUSCULAR | Status: AC
  Filled 2022-11-21: qty 2

## 2022-11-21 MED ORDER — GABAPENTIN 300 MG PO CAPS
300.0000 mg | ORAL_CAPSULE | ORAL | Status: AC
  Administered 2022-11-21: 300 mg via ORAL

## 2022-11-21 MED ORDER — CELECOXIB 200 MG PO CAPS
200.0000 mg | ORAL_CAPSULE | ORAL | Status: AC
  Administered 2022-11-21: 200 mg via ORAL

## 2022-11-21 MED ORDER — BUPIVACAINE HCL (PF) 0.5 % IJ SOLN
INTRAMUSCULAR | Status: DC | PRN
  Administered 2022-11-21: 30 mL

## 2022-11-21 MED ORDER — OXYCODONE HCL 5 MG PO TABS
ORAL_TABLET | ORAL | Status: AC
  Filled 2022-11-21: qty 1

## 2022-11-21 MED ORDER — PHENYLEPHRINE HCL-NACL 20-0.9 MG/250ML-% IV SOLN
INTRAVENOUS | Status: DC | PRN
Start: 2022-11-21 — End: 2022-11-21
  Administered 2022-11-21: 40 ug/min via INTRAVENOUS

## 2022-11-21 MED ORDER — LACTATED RINGERS IV SOLN: INTRAVENOUS | Status: DC

## 2022-11-21 MED ORDER — SCOPOLAMINE 1 MG/3DAYS TD PT72
MEDICATED_PATCH | TRANSDERMAL | Status: AC
  Filled 2022-11-21: qty 1

## 2022-11-21 MED ORDER — DEXAMETHASONE SODIUM PHOSPHATE 10 MG/ML IJ SOLN
INTRAMUSCULAR | Status: AC
  Filled 2022-11-21: qty 1

## 2022-11-21 MED ORDER — PHENYLEPHRINE HCL (PRESSORS) 10 MG/ML IV SOLN
INTRAVENOUS | Status: DC | PRN
  Administered 2022-11-21: 160 ug via INTRAVENOUS

## 2022-11-21 MED ORDER — LIDOCAINE 2% (20 MG/ML) 5 ML SYRINGE
INTRAMUSCULAR | Status: AC
  Filled 2022-11-21: qty 5

## 2022-11-21 MED ORDER — PROMETHAZINE HCL 25 MG/ML IJ SOLN: 6.2500 mg | INTRAMUSCULAR | Status: DC | PRN

## 2022-11-21 MED ORDER — DEXAMETHASONE SODIUM PHOSPHATE 4 MG/ML IJ SOLN
INTRAMUSCULAR | Status: DC | PRN
  Administered 2022-11-21: 5 mg via INTRAVENOUS

## 2022-11-21 MED ORDER — VANCOMYCIN HCL IN DEXTROSE 1-5 GM/200ML-% IV SOLN
INTRAVENOUS | Status: AC
  Filled 2022-11-21: qty 200

## 2022-11-21 MED ORDER — PHENYLEPHRINE HCL (PRESSORS) 10 MG/ML IV SOLN
INTRAVENOUS | Status: AC
  Filled 2022-11-21: qty 1

## 2022-11-21 MED ORDER — HYDROMORPHONE HCL 1 MG/ML IJ SOLN
INTRAMUSCULAR | Status: AC
  Filled 2022-11-21: qty 0.5

## 2022-11-21 MED ORDER — ROCURONIUM BROMIDE 10 MG/ML (PF) SYRINGE
PREFILLED_SYRINGE | INTRAVENOUS | Status: AC
  Filled 2022-11-21: qty 10

## 2022-11-21 MED ORDER — PROPOFOL 10 MG/ML IV BOLUS
INTRAVENOUS | Status: DC | PRN
Start: 2022-11-21 — End: 2022-11-21
  Administered 2022-11-21: 200 mg via INTRAVENOUS

## 2022-11-21 MED ORDER — AMISULPRIDE (ANTIEMETIC) 5 MG/2ML IV SOLN
INTRAVENOUS | Status: AC
  Filled 2022-11-21: qty 2

## 2022-11-21 MED ORDER — MIDAZOLAM HCL 5 MG/5ML IJ SOLN
INTRAMUSCULAR | Status: DC | PRN
  Administered 2022-11-21: 2 mg via INTRAVENOUS

## 2022-11-21 MED ORDER — BUPIVACAINE HCL (PF) 0.5 % IJ SOLN
INTRAMUSCULAR | Status: AC
  Filled 2022-11-21: qty 30

## 2022-11-21 MED ORDER — PHENYLEPHRINE 80 MCG/ML (10ML) SYRINGE FOR IV PUSH (FOR BLOOD PRESSURE SUPPORT)
PREFILLED_SYRINGE | INTRAVENOUS | Status: AC
  Filled 2022-11-21: qty 10

## 2022-11-21 MED ORDER — FENTANYL CITRATE (PF) 100 MCG/2ML IJ SOLN
25.0000 ug | INTRAMUSCULAR | Status: DC | PRN
  Administered 2022-11-21 (×3): 50 ug via INTRAVENOUS

## 2022-11-21 MED ORDER — ATROPINE SULFATE 0.4 MG/ML IV SOLN
INTRAVENOUS | Status: AC
  Filled 2022-11-21: qty 1

## 2022-11-21 MED ORDER — HYDROMORPHONE HCL 1 MG/ML IJ SOLN
0.5000 mg | INTRAMUSCULAR | Status: DC | PRN
  Administered 2022-11-21 (×2): 0.5 mg via INTRAVENOUS

## 2022-11-21 MED ORDER — VANCOMYCIN HCL IN DEXTROSE 1-5 GM/200ML-% IV SOLN
1000.0000 mg | INTRAVENOUS | Status: AC
  Administered 2022-11-21: 1000 mg via INTRAVENOUS

## 2022-11-21 MED ORDER — FENTANYL CITRATE (PF) 100 MCG/2ML IJ SOLN
INTRAMUSCULAR | Status: DC | PRN
  Administered 2022-11-21: 100 ug via INTRAVENOUS

## 2022-11-21 MED ORDER — BUPIVACAINE-EPINEPHRINE (PF) 0.5% -1:200000 IJ SOLN
INTRAMUSCULAR | Status: DC | PRN
Start: 2022-11-21 — End: 2022-11-21

## 2022-11-21 MED ORDER — SODIUM CHLORIDE 0.9 % IV SOLN
INTRAVENOUS | Status: AC
  Filled 2022-11-21: qty 10

## 2022-11-21 MED ORDER — SUCCINYLCHOLINE CHLORIDE 200 MG/10ML IV SOSY
PREFILLED_SYRINGE | INTRAVENOUS | Status: AC
  Filled 2022-11-21: qty 10

## 2022-11-21 MED ORDER — LIDOCAINE HCL (CARDIAC) PF 100 MG/5ML IV SOSY
PREFILLED_SYRINGE | INTRAVENOUS | Status: DC | PRN
  Administered 2022-11-21: 60 mg via INTRAVENOUS

## 2022-11-21 MED ORDER — EPHEDRINE SULFATE (PRESSORS) 50 MG/ML IJ SOLN
INTRAMUSCULAR | Status: DC | PRN
Start: 2022-11-21 — End: 2022-11-21
  Administered 2022-11-21: 10 mg via INTRAVENOUS

## 2022-11-21 MED ORDER — OXYCODONE HCL 5 MG/5ML PO SOLN: 5.0000 mg | Freq: Once | ORAL | Status: AC | PRN

## 2022-11-21 MED ORDER — ROCURONIUM BROMIDE 100 MG/10ML IV SOLN
INTRAVENOUS | Status: DC | PRN
  Administered 2022-11-21: 50 mg via INTRAVENOUS

## 2022-11-21 MED ORDER — SODIUM CHLORIDE 0.9 % IV SOLN
INTRAVENOUS | Status: DC | PRN
  Administered 2022-11-21: 500 mL

## 2022-11-21 MED ORDER — EPHEDRINE 5 MG/ML INJ
INTRAVENOUS | Status: AC
  Filled 2022-11-21: qty 5

## 2022-11-21 MED ORDER — ACETAMINOPHEN 500 MG PO TABS
ORAL_TABLET | ORAL | Status: AC
  Filled 2022-11-21: qty 2

## 2022-11-21 MED ORDER — SUGAMMADEX SODIUM 200 MG/2ML IV SOLN
INTRAVENOUS | Status: DC | PRN
  Administered 2022-11-21: 200 mg via INTRAVENOUS

## 2022-11-21 MED ORDER — OXYCODONE-ACETAMINOPHEN 7.5-325 MG PO TABS: 1.0000 | ORAL_TABLET | Freq: Four times a day (QID) | ORAL | 0 refills | Status: DC | PRN

## 2022-11-21 SURGICAL SUPPLY — 85 items
ADH SKN CLS APL DERMABOND .7 (GAUZE/BANDAGES/DRESSINGS) ×4
ALLOGRAFT PERF 16X20 1.6+/-0.4 (Tissue) IMPLANT
APL PRP STRL LF DISP 70% ISPRP (MISCELLANEOUS) ×4
BAG DECANTER FOR FLEXI CONT (MISCELLANEOUS) ×3 IMPLANT
BINDER BREAST XLRG (GAUZE/BANDAGES/DRESSINGS) IMPLANT
BINDER BREAST XXLRG (GAUZE/BANDAGES/DRESSINGS) IMPLANT
BLADE SURG 10 STRL SS (BLADE) ×6 IMPLANT
BLADE SURG 15 STRL LF DISP TIS (BLADE) ×3 IMPLANT
BLADE SURG 15 STRL SS (BLADE) ×2
BNDG GAUZE DERMACEA FLUFF 4 (GAUZE/BANDAGES/DRESSINGS) ×6 IMPLANT
BNDG GZE DERMACEA 4 6PLY (GAUZE/BANDAGES/DRESSINGS) ×4
CANISTER SUCT 1200ML W/VALVE (MISCELLANEOUS) ×3 IMPLANT
CHLORAPREP W/TINT 26 (MISCELLANEOUS) ×6 IMPLANT
COVER BACK TABLE 60X90IN (DRAPES) ×3 IMPLANT
COVER MAYO STAND STRL (DRAPES) ×3 IMPLANT
DERMABOND ADVANCED .7 DNX12 (GAUZE/BANDAGES/DRESSINGS) ×6 IMPLANT
DRAIN CHANNEL 15F RND FF W/TCR (WOUND CARE) IMPLANT
DRAIN CHANNEL 19F RND (DRAIN) IMPLANT
DRAPE INCISE IOBAN 66X45 STRL (DRAPES) IMPLANT
DRAPE TOP ARMCOVERS (MISCELLANEOUS) ×3 IMPLANT
DRAPE U-SHAPE 76X120 STRL (DRAPES) ×3 IMPLANT
DRAPE UTILITY XL STRL (DRAPES) ×3 IMPLANT
DRSG TEGADERM 2-3/8X2-3/4 SM (GAUZE/BANDAGES/DRESSINGS) IMPLANT
DRSG TEGADERM 4X10 (GAUZE/BANDAGES/DRESSINGS) IMPLANT
ELECT BLADE 4.0 EZ CLEAN MEGAD (MISCELLANEOUS) ×2
ELECT COATED BLADE 2.86 ST (ELECTRODE) ×3 IMPLANT
ELECT REM PT RETURN 9FT ADLT (ELECTROSURGICAL) ×2
ELECTRODE BLDE 4.0 EZ CLN MEGD (MISCELLANEOUS) ×3 IMPLANT
ELECTRODE REM PT RTRN 9FT ADLT (ELECTROSURGICAL) ×3 IMPLANT
EVACUATOR SILICONE 100CC (DRAIN) IMPLANT
GAUZE PAD ABD 8X10 STRL (GAUZE/BANDAGES/DRESSINGS) ×6 IMPLANT
GLOVE BIO SURGEON STRL SZ 6 (GLOVE) ×6 IMPLANT
GOWN STRL REUS W/ TWL LRG LVL3 (GOWN DISPOSABLE) ×6 IMPLANT
GOWN STRL REUS W/TWL LRG LVL3 (GOWN DISPOSABLE) ×4
IMPL BREAST 6.3X FULL 560 (Breast) IMPLANT
IMPL BREAST P6.1XRND LO 495 (Breast) IMPLANT
IMPL BRST 6.3X FULL 560CC (Breast) ×2 IMPLANT
IMPL BRST P6.1XRND LO 495CC (Breast) ×2 IMPLANT
IMPLANT BREAST GEL 495CC (Breast) ×2 IMPLANT
IMPLANT BREAST GEL 560CC (Breast) ×2 IMPLANT
KIT FILL ASEPTIC TRANSFER (MISCELLANEOUS) IMPLANT
MARKER SKIN DUAL TIP RULER LAB (MISCELLANEOUS) IMPLANT
NDL FILTER BLUNT 18X1 1/2 (NEEDLE) IMPLANT
NDL HYPO 25X1 1.5 SAFETY (NEEDLE) IMPLANT
NDL SAFETY ECLIP 18X1.5 (MISCELLANEOUS) ×3 IMPLANT
NEEDLE FILTER BLUNT 18X1 1/2 (NEEDLE)
NEEDLE HYPO 25X1 1.5 SAFETY (NEEDLE) ×2
PACK BASIN DAY SURGERY FS (CUSTOM PROCEDURE TRAY) ×3 IMPLANT
PENCIL SMOKE EVACUATOR (MISCELLANEOUS) ×3 IMPLANT
PIN SAFETY STERILE (MISCELLANEOUS) IMPLANT
PUNCH BIOPSY 4MM DISP (MISCELLANEOUS) IMPLANT
SHEET MEDIUM DRAPE 40X70 STRL (DRAPES) ×3 IMPLANT
SIZER BREAST 495CC (SIZER) ×2
SIZER BREAST 545CC (SIZER) ×2
SIZER BREAST 560CC (SIZER) ×2
SIZER BRST 495CC (SIZER) IMPLANT
SIZER BRSTX STRL LF SIL 545CC (SIZER) IMPLANT
SIZER BRSTX STRL LF SIL 560CC (SIZER) IMPLANT
SLEEVE SCD COMPRESS KNEE MED (STOCKING) ×3 IMPLANT
SPIKE FLUID TRANSFER (MISCELLANEOUS) IMPLANT
SPONGE T-LAP 18X18 ~~LOC~~+RFID (SPONGE) ×6 IMPLANT
STAPLER VISISTAT 35W (STAPLE) ×3 IMPLANT
SUT CHROMIC 4 0 PS 2 18 (SUTURE) IMPLANT
SUT ETHILON 2 0 FS 18 (SUTURE) ×3 IMPLANT
SUT MNCRL AB 4-0 PS2 18 (SUTURE) ×3 IMPLANT
SUT PDS AB 2-0 CT2 27 (SUTURE) IMPLANT
SUT SILK 2 0 SH (SUTURE) IMPLANT
SUT VIC AB 3-0 PS1 18 (SUTURE)
SUT VIC AB 3-0 PS1 18XBRD (SUTURE) IMPLANT
SUT VIC AB 3-0 SH 27 (SUTURE) ×2
SUT VIC AB 3-0 SH 27X BRD (SUTURE) IMPLANT
SUT VIC AB 4-0 PS2 18 (SUTURE) IMPLANT
SUT VICRYL 0 CT-2 (SUTURE) IMPLANT
SUT VLOC 180 0 24IN GS25 (SUTURE) IMPLANT
SYR 10ML LL (SYRINGE) ×3 IMPLANT
SYR 20ML LL LF (SYRINGE) IMPLANT
SYR 50ML LL SCALE MARK (SYRINGE) ×3 IMPLANT
SYR BULB IRRIG 60ML STRL (SYRINGE) ×6 IMPLANT
SYR CONTROL 10ML LL (SYRINGE) IMPLANT
TAPE MEASURE VINYL STERILE (MISCELLANEOUS) IMPLANT
TOWEL GREEN STERILE FF (TOWEL DISPOSABLE) ×6 IMPLANT
TRAY DSU PREP LF (CUSTOM PROCEDURE TRAY) IMPLANT
TUBE CONNECTING 20X1/4 (TUBING) ×3 IMPLANT
UNDERPAD 30X36 HEAVY ABSORB (UNDERPADS AND DIAPERS) ×6 IMPLANT
YANKAUER SUCT BULB TIP NO VENT (SUCTIONS) ×3 IMPLANT

## 2022-11-21 NOTE — Op Note (Signed)
Operative Note   DATE OF OPERATION: 9.24.24  LOCATION: [facility]-outpatient  SURGICAL DIVISION: Plastic Surgery  PREOPERATIVE DIAGNOSES:  1. History breast cancer 2. Acquired absence bilateral breasts  POSTOPERATIVE DIAGNOSES:  same  PROCEDURE:  1. Removal bilateral chest tissue expanders and placement silicone implants 2. Acellular dermis (Alloderm) to right chest 320 cm2  SURGEON: Glenna Fellows MD MBA  ASSISTANT: none  ANESTHESIA:  General.   EBL: 25 ml  COMPLICATIONS: None immediate.   INDICATIONS FOR PROCEDURE:  The patient, Haley Evans, is a 42 y.o. female born on [birth year], is here for staged breast reconstruction follow nipple sparing mastectomies and expander based reconstruction. Over right chest expander placed in dual plane/subpectoral position. Plan conversion to prepectoral position.   FINDINGS: Complete incorporation ADM noted over left chest. Over right chest, drainage tissue expander revealed approximately 300-350 ml saline. Natrelle Soft Touch smooth round Extra Projection implant placed bilateral. RIGHT 560 ml REF SSX-460 SN [id number] LEFT 495 ml REF SSX-495 [id number]  DESCRIPTION OF PROCEDURE:  The patient's operative site was marked with the patient in the preoperative area. The patient was taken to the operating room. SCDs were placed and IV antibiotics were given. The patient's operative site was prepped and draped in a sterile fashion. A time out was performed and all information was confirmed to be correct. Incision made in left chest prior mastectomy scar at inframammary fold. Incision carried through superficial fascia and acellular dermis. Expander removed. Capsulotomies performed medially and superiorly. Sizer placed. I then directed my attention to right chest.  Incision made in right chest inframammary fold mastectomy scar and carried to implant capsule. Expander removed. Caudal border of pectoralis muscle identified and muscle dissected  free of mastectomy flap. Pectoralis muscle sutured to chest wall with 0 V lock suture. Inferior displacement of right expander noted preoperatively, and inframammary fold stabilized with interrupted 2-0 PDS from superficial fascia caudal to incision to chest wall. Sizer placed. Patient brought to upright sitting position and assessed for symmetry. Extra projection 560 ml implant selected for right chest and 495 ml implant selected for left chest placement. Patient returned to supine position.   Each cavity irrigated with saline solution containing Ancef gentamicin and Betadine. Hemostasis ensured. Local anesthetic infiltrated in bilateral chest. 19 Fr JP placed percutaneously in right chest and secured with 2-0 nylon. Acellular dermis prepared. This was wrapped around implant on back table with 2-0 PDS spanning sutures. Implant acellular dermis construct placed in right chest cavity. Acellular dermis secured to chest wall along inframammary fold with 0 V lock suture. Closure completed with 3-0 vicryl in superficial fascia, 4-0 vicryl in dermis, and 4-0 monocryl subcuticular skin closure.   Over left chest, implant placed. Orientation implant ensured. Closure completed with 3-0 vicryl to approximate superficial fascia over implant, 4-0 vicryl in dermis, and 4-0 monocryl subcuticular skin closure. Dermabond applied to incisions. Tegaderms applied over right chest. Dry dressing and breast binder applied.   The patient was allowed to wake from anesthesia, extubated and taken to the recovery room in satisfactory condition.   SPECIMENS: none  DRAINS: 19 Fr JP in right subcutaneous chest.   Glenna Fellows, MD [facility]astic [facility] Reconstructive Surgery  Office/ physician access line after hours [phone]

## 2022-11-21 NOTE — Discharge Instructions (Addendum)
No Tylenol or ibuprofen until after 1pm today if needed  Post Anesthesia Home Care Instructions  Activity: Get plenty of rest for the remainder of the day. A responsible individual must stay with you for 24 hours following the procedure.  For the next 24 hours, DO NOT: -Drive a car -Advertising copywriter -Drink alcoholic beverages -Take any medication unless instructed by your physician -Make any legal decisions or sign important papers.  Meals: Start with liquid foods such as gelatin or soup. Progress to regular foods as tolerated. Avoid greasy, spicy, heavy foods. If nausea and/or vomiting occur, drink only clear liquids until the nausea and/or vomiting subsides. Call your physician if vomiting continues.  Special Instructions/Symptoms: Your throat may feel dry or sore from the anesthesia or the breathing tube placed in your throat during surgery. If this causes discomfort, gargle with warm salt water. The discomfort should disappear within 24 hours.  If you had a scopolamine patch placed behind your ear for the management of post- operative nausea and/or vomiting:  1. The medication in the patch is effective for 72 hours, after which it should be removed.  Wrap patch in a tissue and discard in the trash. Wash hands thoroughly with soap and water. 2. You may remove the patch earlier than 72 hours if you experience unpleasant side effects which may include dry mouth, dizziness or visual disturbances. 3. Avoid touching the patch. Wash your hands with soap and water after contact with the patch.

## 2022-11-21 NOTE — Transfer of Care (Signed)
Immediate Anesthesia Transfer of Care Note  Patient: Haley Evans  Procedure(s) Performed: REMOVAL OF BILATERAL TISSUE EXPANDERS WITH PLACEMENT OF SILICONE IMPLANTS (Bilateral: Breast) PLACEMENT OF ALLODERM (Right: Chest)  Patient Location: PACU  Anesthesia Type:General  Level of Consciousness: awake, alert , oriented, drowsy, and patient cooperative  Airway & Oxygen Therapy: Patient Spontanous Breathing and Patient connected to face mask oxygen  Post-op Assessment: Report given to RN and Post -op Vital signs reviewed and stable  Post vital signs: Reviewed and stable  Last Vitals:  Vitals Value Taken Time  BP    Temp    Pulse 100 11/21/22 1000  Resp    SpO2 93 % 11/21/22 1000  Vitals shown include unfiled device data.  Last Pain:  Vitals:   11/21/22 0649  TempSrc: Oral  PainSc: 0-No pain         Complications: No notable events documented.

## 2022-11-21 NOTE — Anesthesia Preprocedure Evaluation (Addendum)
Anesthesia Evaluation  Patient identified by MRN, date of birth, ID band Patient awake    Reviewed: Allergy & Precautions, NPO status , Patient's Chart, lab work & pertinent test results  Airway Mallampati: II  TM Distance: >3 FB Neck ROM: Full    Dental no notable dental hx.    Pulmonary former smoker   Pulmonary exam normal        Cardiovascular negative cardio ROS Normal cardiovascular exam     Neuro/Psych Seizures -, Well Controlled,  PSYCHIATRIC DISORDERS Anxiety Depression Bipolar Disorder      GI/Hepatic negative GI ROS, Neg liver ROS,,,  Endo/Other  Patient on GLP-1 Agonist  Renal/GU negative Renal ROS     Musculoskeletal negative musculoskeletal ROS (+)    Abdominal   Peds  Hematology negative hematology ROS (+)   Anesthesia Other Findings History breast cancer acquired absence breasts  Reproductive/Obstetrics Hcg negative                              Anesthesia Physical Anesthesia Plan  ASA: 2  Anesthesia Plan: General   Post-op Pain Management:    Induction: Intravenous  PONV Risk Score and Plan: 3 and Ondansetron, Dexamethasone, Midazolam and Treatment may vary due to age or medical condition  Airway Management Planned: Oral ETT  Additional Equipment:   Intra-op Plan:   Post-operative Plan: Extubation in OR  Informed Consent: I have reviewed the patients History and Physical, chart, labs and discussed the procedure including the risks, benefits and alternatives for the proposed anesthesia with the patient or authorized representative who has indicated his/her understanding and acceptance.     Dental advisory given  Plan Discussed with: CRNA  Anesthesia Plan Comments:         Anesthesia Quick Evaluation

## 2022-11-21 NOTE — Anesthesia Procedure Notes (Signed)
Procedure Name: Intubation Date/Time: 11/21/2022 7:38 AM  Performed by: Ronnette Hila, CRNAPre-anesthesia Checklist: Patient identified, Emergency Drugs available, Suction available and Patient being monitored Patient Re-evaluated:Patient Re-evaluated prior to induction Oxygen Delivery Method: Circle system utilized Preoxygenation: Pre-oxygenation with 100% oxygen Induction Type: IV induction Ventilation: Mask ventilation without difficulty Laryngoscope Size: Mac and 3 Grade View: Grade I Tube type: Oral Tube size: 7.0 mm Number of attempts: 1 Airway Equipment and Method: Stylet and Oral airway Placement Confirmation: ETT inserted through vocal cords under direct vision, positive ETCO2 and breath sounds checked- equal and bilateral Secured at: 22 cm Tube secured with: Tape Dental Injury: Teeth and Oropharynx as per pre-operative assessment

## 2022-11-21 NOTE — Interval H&P Note (Signed)
History and Physical Interval Note:  11/21/2022 6:48 AM  Haley Evans  has presented today for surgery, with the diagnosis of History breast cancer acquired absence breasts.  The various methods of treatment have been discussed with the patient and family. After consideration of risks, benefits and other options for treatment, the patient has consented to  Procedure(s): REMOVAL OF BILATERAL TISSUE EXPANDERS WITH PLACEMENT OF SILICONE IMPLANTS (Bilateral) PLACEMENT OF ALLODERM (Right) as a surgical intervention.  The patient's history has been reviewed, patient examined, no change in status, stable for surgery.  I have reviewed the patient's chart and labs.  Questions were answered to the patient's satisfaction.     Haley Evans Haley Evans

## 2022-11-22 ENCOUNTER — Encounter (HOSPITAL_BASED_OUTPATIENT_CLINIC_OR_DEPARTMENT_OTHER): Payer: Self-pay | Admitting: Plastic Surgery

## 2022-11-22 NOTE — Anesthesia Postprocedure Evaluation (Signed)
Anesthesia Post Note  Patient: Haley Evans  Procedure(s) Performed: REMOVAL OF BILATERAL TISSUE EXPANDERS WITH PLACEMENT OF SILICONE IMPLANTS (Bilateral: Breast) PLACEMENT OF ALLODERM (Right: Chest)     Patient location during evaluation: PACU Anesthesia Type: General Level of consciousness: awake Pain management: pain level controlled Vital Signs Assessment: post-procedure vital signs reviewed and stable Respiratory status: spontaneous breathing, nonlabored ventilation and respiratory function stable Cardiovascular status: blood pressure returned to baseline and stable Postop Assessment: no apparent nausea or vomiting Anesthetic complications: no   No notable events documented.  Last Vitals:  Vitals:   11/21/22 1115 11/21/22 1150  BP:  110/78  Pulse: 96 91  Resp: 16 20  Temp:  (!) 36.3 C  SpO2: 98% 97%    Last Pain:  Vitals:   11/22/22 1121  TempSrc:   PainSc: 8                  Haley Evans

## 2022-11-23 ENCOUNTER — Encounter: Payer: Self-pay | Admitting: Physical Medicine & Rehabilitation

## 2022-11-27 MED ORDER — OXYCODONE-ACETAMINOPHEN 7.5-325 MG PO TABS: 1.0000 | ORAL_TABLET | Freq: Four times a day (QID) | ORAL | 0 refills | Status: DC | PRN

## 2022-12-04 ENCOUNTER — Encounter: Payer: No Typology Code available for payment source | Admitting: Physical Medicine & Rehabilitation

## 2022-12-04 NOTE — Progress Notes (Unsigned)
Subjective:    Patient ID: Haley Evans, female    DOB: 08-27-1980, 42 y.o.   MRN: [id number]  HPI   Pain Inventory Average Pain {NUMBERS; 0-10:5044} Pain Right Now {NUMBERS; 0-10:5044} My pain is {PAIN DESCRIPTION:21022940}  In the last 24 hours, has pain interfered with the following? General activity {NUMBERS; 0-10:5044} Relation with others {NUMBERS; 0-10:5044} Enjoyment of life {NUMBERS; 0-10:5044} What TIME of day is your pain at its worst? {time of day:24191} Sleep (in general) {BHH GOOD/FAIR/POOR:22877}  Pain is worse with: {ACTIVITIES:21022942} Pain improves with: {PAIN IMPROVES PNTI:14431540} Relief from Meds: {NUMBERS; 0-10:5044}  Family History  Problem Relation Age of Onset   Bladder Cancer Maternal Grandmother 54   Pancreatic cancer Maternal Grandfather 38   Social History   Socioeconomic History   Marital status: Single    Spouse name: Not on file   Number of children: Not on file   Years of education: Not on file   Highest education level: Not on file  Occupational History   Occupation: Conservator, museum/gallery    Comment: insurance company  Tobacco Use   Smoking status: Former    Current packs/day: 0.00    Types: Cigarettes    Start date: 04/12/2001    Quit date: 04/12/2021    Years since quitting: 1.6   Smokeless tobacco: Never  Vaping Use   Vaping status: Never Used  Substance and Sexual Activity   Alcohol use: Not Currently    Alcohol/week: 5.0 standard drinks of alcohol    Types: 5 Standard drinks or equivalent per week   Drug use: No   Sexual activity: Not on file    Comment: [employer] 10 yr IUD  Other Topics Concern   Not on file  Social History Narrative   Not on file   Social Determinants of Health   Financial Resource Strain: Not on file  Food Insecurity: Low Risk  (10/25/2022)   Received from [facility]   Hunger Vital Sign    Worried About Running Out of Food in the Last Year: Never true    Ran Out of Food in the Last Year:  Never true  Transportation Needs: No Transportation Needs (10/25/2022)   Received from [facility]    In the past 12 months, has lack of reliable transportation kept you from medical appointments, meetings, work or from getting things needed for daily living? : No  Physical Activity: Not on file  Stress: Not on file  Social Connections: Not on file   Past Surgical History:  Procedure Laterality Date   ARM WOUND REPAIR / CLOSURE Right    fractured arm   BREAST IMPLANT REMOVAL Left 12/30/2021   Procedure: REMOVAL LEFT CHEST TISSUE EXPANDER;  Surgeon: Glenna Fellows, MD;  Location: [facility];  Service: Plastics;  Laterality: Left;   BREAST RECONSTRUCTION WITH PLACEMENT OF TISSUE EXPANDER AND ALLODERM Left 08/18/2022   Procedure: BREAST RECONSTRUCTION WITH PLACEMENT OF TISSUE EXPANDER AND  ALLODERM;  Surgeon: Glenna Fellows, MD;  Location: [facility];  Service: Plastics;  Laterality: Left;   BREAST RECONSTRUCTION WITH PLACEMENT OF TISSUE EXPANDER AND ALLODERM Right 11/21/2022   Procedure: PLACEMENT OF ALLODERM;  Surgeon: Glenna Fellows, MD;  Location: [facility];  Service: Plastics;  Laterality: Right;   BREAST RECONSTRUCTION WITH PLACEMENT OF TISSUE EXPANDER AND FLEX HD (ACELLULAR HYDRATED DERMIS) Left 06/15/2021   Procedure: REPLACEMENT OF TISSUE EXPANDER AND FLEX HD (ACELLULAR HYDRATED DERMIS);  Surgeon: Peggye Form, DO;  [facility];  Service: Plastics;  Laterality: Left;   CESAREAN SECTION     DEBRIDEMENT AND CLOSURE WOUND Left 06/15/2021   Procedure: DEBRIDEMENT AND CLOSURE LEFT BREAST WOUND;  Surgeon: Peggye Form, DO;  Location: [facility];  Service: Plastics;  Laterality: Left;  1 hour   LATISSIMUS FLAP TO BREAST Left 07/18/2021   Procedure: LATISSIMUS FLAP TO BREAST;  Surgeon: Peggye Form, DO;  [facility];  Service: Plastics;  Laterality: Left;   NIPPLE SPARING MASTECTOMY Right 05/12/2021    Procedure: RIGHT NIPPLE SPARING MASTECTOMY;  Surgeon: Griselda Miner, MD;  Location: [facility];  Service: General;  Laterality: Right;   NIPPLE SPARING MASTECTOMY WITH SENTINEL LYMPH NODE BIOPSY Left 05/12/2021   Procedure: LEFT NIPPLE SPARING MASTECTOMY WITH LEFT SENTINEL LYMPH NODE BIOPSY;  Surgeon: Griselda Miner, MD;  Location: [facility];  Service: General;  Laterality: Left;   PRE-PECTORAL BREAST RECON W/ PLACEMENT OF TISSUE EXPANDERS, DERMAL GRAFT Bilateral 05/12/2021   Procedure: IMMEDIATE BILATERAL BREAST RECONSTRUCTION WITH PLACEMENT OF TISSUE EXPANDERS AND FLEX HD;  Surgeon: Peggye Form, DO;  Location: [facility];  Service: Plastics;  Laterality: Bilateral;   REMOVAL OF BILATERAL TISSUE EXPANDERS WITH PLACEMENT OF BILATERAL BREAST IMPLANTS Bilateral 11/21/2022   Procedure: REMOVAL OF BILATERAL TISSUE EXPANDERS WITH PLACEMENT OF SILICONE IMPLANTS;  Surgeon: Glenna Fellows, MD;  Location: [facility];  Service: Plastics;  Laterality: Bilateral;   REMOVAL OF TISSUE EXPANDER Left 10/27/2021   Procedure: Left breast seroma drainage with tissue expander exchange;  Surgeon: Peggye Form, DO;  [facility];  Service: Plastics;  Laterality: Left;   TISSUE EXPANDER PLACEMENT Left 07/18/2021   Procedure: REMOVAL AND REPLACEMENT OF TISSUE EXPANDER;  Surgeon: Peggye Form, DO;  [facility];  Service: Plastics;  Laterality: Left;  3 hours   WOUND EXPLORATION Left 08/18/2022   Procedure: PERIPHERAL NERVE DIVISION;  Surgeon: Glenna Fellows, MD;  Location: [facility];  Service: Plastics;  Laterality: Left;   Past Surgical History:  Procedure Laterality Date   ARM WOUND REPAIR / CLOSURE Right    fractured arm   BREAST IMPLANT REMOVAL Left 12/30/2021   Procedure: REMOVAL LEFT CHEST TISSUE EXPANDER;  Surgeon: Glenna Fellows, MD;  Location: [facility];  Service: Plastics;   Laterality: Left;   BREAST RECONSTRUCTION WITH PLACEMENT OF TISSUE EXPANDER AND ALLODERM Left 08/18/2022   Procedure: BREAST RECONSTRUCTION WITH PLACEMENT OF TISSUE EXPANDER AND  ALLODERM;  Surgeon: Glenna Fellows, MD;  Location: [facility];  Service: Plastics;  Laterality: Left;   BREAST RECONSTRUCTION WITH PLACEMENT OF TISSUE EXPANDER AND ALLODERM Right 11/21/2022   Procedure: PLACEMENT OF ALLODERM;  Surgeon: Glenna Fellows, MD;  Location: [facility];  Service: Plastics;  Laterality: Right;   BREAST RECONSTRUCTION WITH PLACEMENT OF TISSUE EXPANDER AND FLEX HD (ACELLULAR HYDRATED DERMIS) Left 06/15/2021   Procedure: REPLACEMENT OF TISSUE EXPANDER AND FLEX HD (ACELLULAR HYDRATED DERMIS);  Surgeon: Peggye Form, DO;  [facility];  Service: Plastics;  Laterality: Left;   CESAREAN SECTION     DEBRIDEMENT AND CLOSURE WOUND Left 06/15/2021   Procedure: DEBRIDEMENT AND CLOSURE LEFT BREAST WOUND;  Surgeon: Peggye Form, DO;  [facility];  Service: Plastics;  Laterality: Left;  1 hour   LATISSIMUS FLAP TO BREAST Left 07/18/2021   Procedure: LATISSIMUS FLAP TO BREAST;  Surgeon: Peggye Form, DO;  [facility];  Service: Plastics;  Laterality:  Left;   NIPPLE SPARING MASTECTOMY Right 05/12/2021   Procedure: RIGHT NIPPLE SPARING MASTECTOMY;  Surgeon: Griselda Miner, MD;  Location: [facility];  Service: General;  Laterality: Right;   NIPPLE SPARING MASTECTOMY WITH SENTINEL LYMPH NODE BIOPSY Left 05/12/2021   Procedure: LEFT NIPPLE SPARING MASTECTOMY WITH LEFT SENTINEL LYMPH NODE BIOPSY;  Surgeon: Griselda Miner, MD;  Location: [facility];  Service: General;  Laterality: Left;   PRE-PECTORAL BREAST RECON W/ PLACEMENT OF TISSUE EXPANDERS, DERMAL GRAFT Bilateral 05/12/2021   Procedure: IMMEDIATE BILATERAL BREAST RECONSTRUCTION WITH PLACEMENT OF TISSUE EXPANDERS AND FLEX HD;  Surgeon: Peggye Form, DO;  Location:  [facility];  Service: Plastics;  Laterality: Bilateral;   REMOVAL OF BILATERAL TISSUE EXPANDERS WITH PLACEMENT OF BILATERAL BREAST IMPLANTS Bilateral 11/21/2022   Procedure: REMOVAL OF BILATERAL TISSUE EXPANDERS WITH PLACEMENT OF SILICONE IMPLANTS;  Surgeon: Glenna Fellows, MD;  Location: [facility];  Service: Plastics;  Laterality: Bilateral;   REMOVAL OF TISSUE EXPANDER Left 10/27/2021   Procedure: Left breast seroma drainage with tissue expander exchange;  Surgeon: Peggye Form, DO;  [facility];  Service: Plastics;  Laterality: Left;   TISSUE EXPANDER PLACEMENT Left 07/18/2021   Procedure: REMOVAL AND REPLACEMENT OF TISSUE EXPANDER;  Surgeon: Peggye Form, DO;  [facility];  Service: Plastics;  Laterality: Left;  3 hours   WOUND EXPLORATION Left 08/18/2022   Procedure: PERIPHERAL NERVE DIVISION;  Surgeon: Glenna Fellows, MD;  Location: [facility];  Service: Plastics;  Laterality: Left;   Past Medical History:  Diagnosis Date   Anemia    Anxiety    Bipolar 2 disorder (HCC)    Cancer (HCC)    breast   Depression    Family history of adverse reaction to anesthesia    aunt woke up during hip replacement   Seizure (HCC)    vs syncopy, age 61 and 12/2017, Seen by neurologist, a definitive diagnosis was not made. Pt reports having seizure aroudn 2021   LMP 11/09/2022 (Exact Date) Comment: UPT negative  Opioid Risk Score:   Fall Risk Score:  `1  Depression screen [facility] 2/9     09/11/2022    2:46 PM 02/07/2022   11:44 AM 11/21/2021    2:02 PM 09/29/2021    1:46 PM  Depression screen PHQ 2/9  Decreased Interest 0 0 0 1  Down, Depressed, Hopeless 0 0 0 0  PHQ - 2 Score 0 0 0 1  Altered sleeping    1  Tired, decreased energy    2  Change in appetite    2  Feeling bad or failure about yourself     0  Trouble concentrating    1  Moving slowly or fidgety/restless    0  Suicidal thoughts    0  PHQ-9 Score    7   Difficult doing work/chores    Somewhat difficult    Review of Systems     Objective:   Physical Exam        Assessment & Plan:

## 2022-12-05 ENCOUNTER — Encounter
Payer: No Typology Code available for payment source | Attending: Physical Medicine & Rehabilitation | Admitting: Physical Medicine & Rehabilitation

## 2022-12-05 ENCOUNTER — Encounter: Payer: Self-pay | Admitting: Physical Medicine & Rehabilitation

## 2022-12-05 VITALS — BP 114/79 | HR 86 | Ht 69.0 in | Wt 167.0 lb

## 2022-12-05 DIAGNOSIS — G8918 Other acute postprocedural pain: Secondary | ICD-10-CM | POA: Insufficient documentation

## 2022-12-05 DIAGNOSIS — G4700 Insomnia, unspecified: Secondary | ICD-10-CM | POA: Insufficient documentation

## 2022-12-05 DIAGNOSIS — R202 Paresthesia of skin: Secondary | ICD-10-CM | POA: Diagnosis present

## 2022-12-05 DIAGNOSIS — Z5181 Encounter for therapeutic drug level monitoring: Secondary | ICD-10-CM | POA: Diagnosis present

## 2022-12-05 DIAGNOSIS — R200 Anesthesia of skin: Secondary | ICD-10-CM | POA: Diagnosis present

## 2022-12-05 MED ORDER — OXYCODONE-ACETAMINOPHEN 5-325 MG PO TABS: 1.0000 | ORAL_TABLET | Freq: Two times a day (BID) | ORAL | 0 refills | Status: DC | PRN

## 2022-12-05 MED ORDER — TRAZODONE HCL 50 MG PO TABS: 25.0000 mg | ORAL_TABLET | Freq: Every evening | ORAL | 4 refills | Status: AC | PRN

## 2022-12-05 NOTE — Progress Notes (Addendum)
Subjective:    Patient ID: Haley Evans, female    DOB: 17-Feb-1981, 42 y.o.   MRN: [id number]  HPI   Haley Evans is a 42 year old female with past medical history of bipolar disorder, breast cancer here for chronic pain.  Patient had mastectomy 05/12/2021.  She then had left breast reconstruction with placement of tissue expander 06/15/2021.  On 07/18/2021 she had another surgery after having poor wound healing and had left breast reconstruction with latissimus flap and tissue expander by Dr. Ulice Bold.  Patient reports that she has had worsening pain since her surgeries.  Pain has has most severe after her third surgery. She has had worsening pain over her left chest back and shoulder.  She has burning and tingling pain over her back at the site of the latissimus flap.  She has dull aching pain around her scapula.  She also has aching in shooting pain around her chest.  She has tried Voltaren gel and Flexeril without much benefit.  She is working with physical therapy and completing dry needling with improvement to her pain.  She has also tried gabapentin 100 mg 3 times daily but reports it makes her feel foggy.  It does provide some benefit to the pain. She also did not take the medication regularly.  She also tried Tylenol and Advil with mild to moderate improvement in her pain when she takes them. She has a history of bipolar disorder. She reports she is not on any medications at this time although she feels her bipolar disorder has been stable recently.       Visit 11/21/2021 Haley Evans is here for follow-up regarding her chronic postmastectomy pain. She had had removal and replacement of L breast tissue expander 10/27/21 by Dr. Ulice Bold.  She reports that her pain has significantly improved since this time.  She no longer has the burning and tingling pain over her chest back and shoulder.  She still has some aching pain in her left lateral back.  She is taking turmeric with benefit.   She also sometimes takes Tylenol (less than 3g daily) or ibuprofen.  She was restarted gabapentin as patient reports she did not tolerate Lyrica very well and felt like it was affecting her mood.  She does not take the gabapentin regularly and will take 100 to 300 mg with each dose.  She is not having significant sedation with this medication.  She reports that her prior concerns of sedation with gabapentin were possibly related to other medications she was taking at the time.  Patient reports that plastic surgery has reordered her physical therapy and she plans to start this soon.   Interval history 02/07/2022 Haley Evans is here for follow-up of her chronic mastectomy related pain.  On 11 3 she had a repeat surgery with removal of left chest tissue expander.  Pain was initially worsened after this procedure and she required a few tablets of oxycodone for acute pain control.  She still has 3 tablets left.  She reports she may have an additional surgery in about 3 months.  She is back in physical therapy and seeing them once a week.  They are working on dry needling and desensitization.  She reports her surgical incisions are healed.  She continues to have flareups of pain that last a few days.  She uses Tylenol and ibuprofen to control this pain.  Gabapentin 200 mg 3 times a day helps to control the neuropathic pain component.  She will sometimes  take 300 mg if the pain is worse.  The gabapentin does make her more hungry.  She also takes tizanidine which also helps her pain.  She would like to avoid any antidepressant medications. Overall she feels that her pain is fairly well-controlled and would not like to make any medication changes at this time.   Interval history 03/17/12 Haley Evans is here for chronic mastectomy related pain.  She recently had a fall where she was seen at the ER after she tripped and fell backwards resulting in a occipital laceration.  She continues to have severe pain around her  lats, chest, neck and shoulder.  She feels like the pain is significantly limiting her activities.  She does not want to do anything or go anywhere due to the pain.  Pain was not improved with Tylenol, Voltaren gel, Lyrica.  She would like to avoid antidepressant medications due to her bipolar history.  She is not using CBD Gummies at this time.  She does use Valium occasionally but would consider switching back to hydroxyzine.   Interval History 05/04/22 Haley Evans is here regarding her chronic chest wall and back pain.  She reports pain is much better controlled with oxycodone.  The medication has lasted her over a month although she is almost out of the medication.  She is not having any side effects.  She reports her friends and family have noticed that she is more active and she is able to participate better in social activities.  She also uses gabapentin that provides benefit.  She tries to use Tylenol or ibuprofen when pain is less severe and only uses oxycodone when pain is really bad.  Sometimes 5 mg oxycodone makes the pain tolerable, other times it is not strong enough and she will have poorly controlled pain.     Interval history 06/29/2022 Patient is here for follow-up regarding her chronic chest wall postmastectomy pain.  Her pain is better controlled with use of oxycodone.  She only uses it sparingly when her pain is very severe.  Some days she will do okay and other days she will have flares when the pain is much worse.  Overall the pain is more tolerable.  She also feels that the pain is getting a little bit better gradually.  She says she is planned for additional surgery this summer.  Additionally she has been having some milder pain in her right trap and shoulder.  This has been going on for about 2 weeks.  She feels that her right trapezius is tight.   Interval history 09/11/22 Patient is here for follow-up regarding her chronic chest wall postmastectomy pain.  Patient had increased pain  after recovering from her recent breast reconstruction surgery with placement of tissue expander and AlloDerm a few weeks ago.  She has been on her usual dose of Percocet 5 milligrams 1 to 1-1/2 tabs after using the 7.5 mg 1 to 1-1/2 tabs dose for a few weeks.  Patient reports that this week her pain has been better overall.  She does have occasional tingling in both hands that will occur intermittently.   Interval history 12/05/22 Patient is here for follow-up regarding her chronic pain.  Since her last visit she has had 2 further surgeries.  She is now on her prior dose of Percocet for pain control.  Pain is still a little bit above baseline however she says she is managing on this regimen.  Surgeries involved left chest bilateral upper back.  She  is also using Tylenol for pain control.  She reports her other doctor has started meloxicam, she is using this instead of ibuprofen.    Pain Inventory Average Pain 7 Pain Right Now 6 My pain is sharp, stabbing, tingling, and aching  In the last 24 hours, has pain interfered with the following? General activity 10 Relation with others 8 Enjoyment of life 8 What TIME of day is your pain at its worst? morning  and daytime Sleep (in general) Fair  Pain is worse with: walking, bending, inactivity, standing, and some activites Pain improves with: rest and medication Relief from Meds: 6  Family History  Problem Relation Age of Onset   Bladder Cancer Maternal Grandmother 6   Pancreatic cancer Maternal Grandfather 63   Social History   Socioeconomic History   Marital status: Single    Spouse name: Not on file   Number of children: Not on file   Years of education: Not on file   Highest education level: Not on file  Occupational History   Occupation: Conservator, museum/gallery    Comment: insurance company  Tobacco Use   Smoking status: Former    Current packs/day: 0.00    Types: Cigarettes    Start date: 04/12/2001    Quit date: 04/12/2021    Years  since quitting: 1.6   Smokeless tobacco: Never  Vaping Use   Vaping status: Never Used  Substance and Sexual Activity   Alcohol use: Not Currently    Alcohol/week: 5.0 standard drinks of alcohol    Types: 5 Standard drinks or equivalent per week   Drug use: No   Sexual activity: Not on file    Comment: Copper 10 yr IUD  Other Topics Concern   Not on file  Social History Narrative   Not on file   Social Determinants of Health   Financial Resource Strain: Not on file  Food Insecurity: Low Risk  (10/25/2022)   Received from [facility]   Hunger Vital Sign    Worried About Running Out of Food in the Last Year: Never true    Ran Out of Food in the Last Year: Never true  Transportation Needs: No Transportation Needs (10/25/2022)   Received from [facility]    In the past 12 months, has lack of reliable transportation kept you from medical appointments, meetings, work or from getting things needed for daily living? : No  Physical Activity: Not on file  Stress: Not on file  Social Connections: Not on file   Past Surgical History:  Procedure Laterality Date   ARM WOUND REPAIR / CLOSURE Right    fractured arm   BREAST IMPLANT REMOVAL Left 12/30/2021   Procedure: REMOVAL LEFT CHEST TISSUE EXPANDER;  Surgeon: Glenna Fellows, MD;  Location: [facility];  Service: Plastics;  Laterality: Left;   BREAST RECONSTRUCTION WITH PLACEMENT OF TISSUE EXPANDER AND ALLODERM Left 08/18/2022   Procedure: BREAST RECONSTRUCTION WITH PLACEMENT OF TISSUE EXPANDER AND  ALLODERM;  Surgeon: Glenna Fellows, MD;  Location: [facility];  Service: Plastics;  Laterality: Left;   BREAST RECONSTRUCTION WITH PLACEMENT OF TISSUE EXPANDER AND ALLODERM Right 11/21/2022   Procedure: PLACEMENT OF ALLODERM;  Surgeon: Glenna Fellows, MD;  Location: [facility];  Service: Plastics;  Laterality: Right;   BREAST RECONSTRUCTION WITH PLACEMENT OF TISSUE EXPANDER  AND FLEX HD (ACELLULAR HYDRATED DERMIS) Left 06/15/2021   Procedure: REPLACEMENT OF TISSUE EXPANDER AND FLEX HD (ACELLULAR HYDRATED DERMIS);  Surgeon: Foster Simpson  S, DO;  Location: [facility];  Service: Plastics;  Laterality: Left;   CESAREAN SECTION     DEBRIDEMENT AND CLOSURE WOUND Left 06/15/2021   Procedure: DEBRIDEMENT AND CLOSURE LEFT BREAST WOUND;  Surgeon: Peggye Form, DO;  Location: [facility];  Service: Plastics;  Laterality: Left;  1 hour   LATISSIMUS FLAP TO BREAST Left 07/18/2021   Procedure: LATISSIMUS FLAP TO BREAST;  Surgeon: Peggye Form, DO;  Location: [facility];  Service: Plastics;  Laterality: Left;   NIPPLE SPARING MASTECTOMY Right 05/12/2021   Procedure: RIGHT NIPPLE SPARING MASTECTOMY;  Surgeon: Griselda Miner, MD;  Location: [facility];  Service: General;  Laterality: Right;   NIPPLE SPARING MASTECTOMY WITH SENTINEL LYMPH NODE BIOPSY Left 05/12/2021   Procedure: LEFT NIPPLE SPARING MASTECTOMY WITH LEFT SENTINEL LYMPH NODE BIOPSY;  Surgeon: Griselda Miner, MD;  Location: [facility];  Service: General;  Laterality: Left;   PRE-PECTORAL BREAST RECON W/ PLACEMENT OF TISSUE EXPANDERS, DERMAL GRAFT Bilateral 05/12/2021   Procedure: IMMEDIATE BILATERAL BREAST RECONSTRUCTION WITH PLACEMENT OF TISSUE EXPANDERS AND FLEX HD;  Surgeon: Peggye Form, DO;  Location: [facility];  Service: Plastics;  Laterality: Bilateral;   REMOVAL OF BILATERAL TISSUE EXPANDERS WITH PLACEMENT OF BILATERAL BREAST IMPLANTS Bilateral 11/21/2022   Procedure: REMOVAL OF BILATERAL TISSUE EXPANDERS WITH PLACEMENT OF SILICONE IMPLANTS;  Surgeon: Glenna Fellows, MD;  Location: [facility];  Service: Plastics;  Laterality: Bilateral;   REMOVAL OF TISSUE EXPANDER Left 10/27/2021   Procedure: Left breast seroma drainage with tissue expander exchange;  Surgeon: Peggye Form, DO;  [facility];  Service: Plastics;  Laterality: Left;    TISSUE EXPANDER PLACEMENT Left 07/18/2021   Procedure: REMOVAL AND REPLACEMENT OF TISSUE EXPANDER;  Surgeon: Peggye Form, DO;  Location: [facility];  Service: Plastics;  Laterality: Left;  3 hours   WOUND EXPLORATION Left 08/18/2022   Procedure: PERIPHERAL NERVE DIVISION;  Surgeon: Glenna Fellows, MD;  Location: [facility];  Service: Plastics;  Laterality: Left;   Past Surgical History:  Procedure Laterality Date   ARM WOUND REPAIR / CLOSURE Right    fractured arm   BREAST IMPLANT REMOVAL Left 12/30/2021   Procedure: REMOVAL LEFT CHEST TISSUE EXPANDER;  Surgeon: Glenna Fellows, MD;  Location: [facility];  Service: Plastics;  Laterality: Left;   BREAST RECONSTRUCTION WITH PLACEMENT OF TISSUE EXPANDER AND ALLODERM Left 08/18/2022   Procedure: BREAST RECONSTRUCTION WITH PLACEMENT OF TISSUE EXPANDER AND  ALLODERM;  Surgeon: Glenna Fellows, MD;  Location: [facility];  Service: Plastics;  Laterality: Left;   BREAST RECONSTRUCTION WITH PLACEMENT OF TISSUE EXPANDER AND ALLODERM Right 11/21/2022   Procedure: PLACEMENT OF ALLODERM;  Surgeon: Glenna Fellows, MD;  Location: [facility];  Service: Plastics;  Laterality: Right;   BREAST RECONSTRUCTION WITH PLACEMENT OF TISSUE EXPANDER AND FLEX HD (ACELLULAR HYDRATED DERMIS) Left 06/15/2021   Procedure: REPLACEMENT OF TISSUE EXPANDER AND FLEX HD (ACELLULAR HYDRATED DERMIS);  Surgeon: Peggye Form, DO;  [facility];  Service: Plastics;  Laterality: Left;   CESAREAN SECTION     DEBRIDEMENT AND CLOSURE WOUND Left 06/15/2021   Procedure: DEBRIDEMENT AND CLOSURE LEFT BREAST WOUND;  Surgeon: Peggye Form, DO;  [facility];  Service: Plastics;  Laterality: Left;  1 hour   LATISSIMUS FLAP TO BREAST Left 07/18/2021   Procedure: LATISSIMUS FLAP TO BREAST;  Surgeon: Peggye Form, DO;  [facility];  Service:  Plastics;  Laterality: Left;   NIPPLE SPARING MASTECTOMY Right  05/12/2021   Procedure: RIGHT NIPPLE SPARING MASTECTOMY;  Surgeon: Griselda Miner, MD;  Location: [facility];  Service: General;  Laterality: Right;   NIPPLE SPARING MASTECTOMY WITH SENTINEL LYMPH NODE BIOPSY Left 05/12/2021   Procedure: LEFT NIPPLE SPARING MASTECTOMY WITH LEFT SENTINEL LYMPH NODE BIOPSY;  Surgeon: Griselda Miner, MD;  Location: [facility];  Service: General;  Laterality: Left;   PRE-PECTORAL BREAST RECON W/ PLACEMENT OF TISSUE EXPANDERS, DERMAL GRAFT Bilateral 05/12/2021   Procedure: IMMEDIATE BILATERAL BREAST RECONSTRUCTION WITH PLACEMENT OF TISSUE EXPANDERS AND FLEX HD;  Surgeon: Peggye Form, DO;  Location: [facility];  Service: Plastics;  Laterality: Bilateral;   REMOVAL OF BILATERAL TISSUE EXPANDERS WITH PLACEMENT OF BILATERAL BREAST IMPLANTS Bilateral 11/21/2022   Procedure: REMOVAL OF BILATERAL TISSUE EXPANDERS WITH PLACEMENT OF SILICONE IMPLANTS;  Surgeon: Glenna Fellows, MD;  Location: [facility];  Service: Plastics;  Laterality: Bilateral;   REMOVAL OF TISSUE EXPANDER Left 10/27/2021   Procedure: Left breast seroma drainage with tissue expander exchange;  Surgeon: Peggye Form, DO;  Location[facility];  Service: Plastics;  Laterality: Left;   TISSUE EXPANDER PLACEMENT Left 07/18/2021   Procedure: REMOVAL AND REPLACEMENT OF TISSUE EXPANDER;  Surgeon: Peggye Form, DO;  Location: [facility];  Service: Plastics;  Laterality: Left;  3 hours   WOUND EXPLORATION Left 08/18/2022   Procedure: PERIPHERAL NERVE DIVISION;  Surgeon: Glenna Fellows, MD;  Location: [facility];  Service: Plastics;  Laterality: Left;   Past Medical History:  Diagnosis Date   Anemia    Anxiety    Bipolar 2 disorder (HCC)    Cancer (HCC)    breast   Depression    Family history of adverse reaction to anesthesia    aunt woke up during hip replacement   Seizure (HCC)    vs syncopy, age 27 and 12/2017, Seen by  neurologist, a definitive diagnosis was not made. Pt reports having seizure aroudn 2021   BP 114/79   Pulse 86   Ht 5\' 9"  (1.753 m)   Wt 167 lb (75.8 kg)   LMP 11/09/2022 (Exact Date) Comment: UPT negative  SpO2 100%   BMI 24.66 kg/m   Opioid Risk Score:   Fall Risk Score:  `1  Depression screen [facility] 2/9     12/05/2022   10:56 AM 09/11/2022    2:46 PM 02/07/2022   11:44 AM 11/21/2021    2:02 PM 09/29/2021    1:46 PM  Depression screen [facility] 2/9  Decreased Interest 0 0 0 0 1  Down, Depressed, Hopeless 0 0 0 0 0  PHQ - 2 Score 0 0 0 0 1  Altered sleeping     1  Tired, decreased energy     2  Change in appetite     2  Feeling bad or failure about yourself      0  Trouble concentrating     1  Moving slowly or fidgety/restless     0  Suicidal thoughts     0  PHQ-9 Score     7  Difficult doing work/chores     Somewhat difficult      Review of Systems  Musculoskeletal:  Positive for back pain.       Rt shoulder  All other systems reviewed and are negative.      Objective:   Physical Exam  Gen: no  distress, normal appearing HEENT: oral mucosa pink and moist, NCAT Chest: normal effort, normal rate of breathing Abd: soft, non-distended Ext: no edema Psych: pleasant, normal affect Skin: intact Neuro: Alert , , follows commands, cranial nerves II through XII grossly intact, sensation intact to light touch in all 4 extremities Strength 5 out of 5 in all 4 extremities Tinel's -not checked today Phalen's - not checked today Musculoskeletal: She continues to have tenderness noted over left and right chest wall, upper arm. Left more than right  Tender lateral left posterior back Able to abduct arms to about 90 degrees b/l      Assessment & Plan:    Post mastectomy and reconstruction pain, gradually improving.  She had removal of eft chest tissue expander on the left on 12/30/21. -Continue gabapentin 100 -300 mg 3 times daily as needed, dose limited by sedation -Continue  tizanidine PRN  -Continue tylenol PRN- discussed try to avoid more than 3000mg  tylenol from all sources a day, definitely less than 4000mg  day -She is no longer taking ibuprofen, she was started on meloxicam by different doctor instead -Continue tumaric  -Continue oxycodone current dose 1-1.5 tabs up to Q12h PRN, 90 tabs ordered, patient advised to call when she is in need of a refill -Pill count appears consistent -Discussed increased risks of using opioid medications while using alprazolam -PDMP reviewed -Patient had multiple recent surgeries, last one was about 2 weeks ago.  Hopefully this will be the last surgery and pain will improve over time.   Right shoulder and trapezius pain -Possibly related to postural/muscular imbalances -Continue Voltaren gel -Discussed trying Thera cane for trapezius prior visit -Consider x-ray for further evaluation if worsens -Zynax cryo heat ordered previously-she decided not to use this due to surgery is concerned about insensate skin, I do think it is a good idea to avoid use over any insensate areas to avoid burn  Hx of bipolar disorder not on medications -She reports her mood has been stable.  -She discussed again that she would like to avoid antidepressant class medications   Insomnia -Increase trazodone to  25-50 mg nightly as needed  Bilateral hand tingling -Improved-continue to monitor -Advised to try wrist splint at night -Consider EMG/nerve conduction study at later time if symptoms worsen or do not improve  12/08/22 called patient have some worsening post surgical pain in her Right shoulder, area poorly controlled with 1.5 tab of the 5mg  percocet.  Will increase percocet 10mg  TID PRN for 10 tabs, do not take percocet 5mg  during this time- can resume when finished

## 2022-12-06 ENCOUNTER — Encounter: Payer: Self-pay | Admitting: Physical Medicine & Rehabilitation

## 2022-12-08 MED ORDER — OXYCODONE-ACETAMINOPHEN 10-325 MG PO TABS
1.0000 | ORAL_TABLET | Freq: Three times a day (TID) | ORAL | 0 refills | Status: DC | PRN
Start: 2022-12-08 — End: 2023-02-26

## 2022-12-08 MED ORDER — GABAPENTIN 100 MG PO CAPS: 100.0000 mg | ORAL_CAPSULE | Freq: Three times a day (TID) | ORAL | 3 refills | Status: AC

## 2022-12-08 MED ORDER — MELOXICAM 7.5 MG PO TABS: 7.5000 mg | ORAL_TABLET | Freq: Every day | ORAL | 0 refills | Status: DC

## 2022-12-08 NOTE — Addendum Note (Signed)
Addended by: Fanny Dance on: 12/08/2022 05:19 PM   Modules accepted: Orders

## 2022-12-25 ENCOUNTER — Ambulatory Visit: Payer: No Typology Code available for payment source | Attending: General Surgery

## 2022-12-25 VITALS — Wt 164.2 lb

## 2022-12-25 DIAGNOSIS — Z483 Aftercare following surgery for neoplasm: Secondary | ICD-10-CM | POA: Insufficient documentation

## 2022-12-25 NOTE — Therapy (Signed)
OUTPATIENT PHYSICAL THERAPY SOZO SCREENING NOTE   Patient Name: Haley Evans MRN: [id number] DOB:[birth year], 42 y.o., female Today's Date: 12/25/2022  PCP: Iona Hansen, NP REFERRING PROVIDER: Griselda Miner, MD   PT End of Session - 12/25/22 1637     Visit Number 9   # unchanged due to screen only   PT Start Time 1635    PT Stop Time 1640    PT Time Calculation (min) 5 min    Activity Tolerance Patient tolerated treatment well    Behavior During Therapy WFL for tasks assessed/performed              Past Medical History:  Diagnosis Date   Anemia    Anxiety    Bipolar 2 disorder (HCC)    Cancer (HCC)    breast   Depression    Family history of adverse reaction to anesthesia    aunt woke up during hip replacement   Seizure (HCC)    vs syncopy, age 40 and 12/2017, Seen by neurologist, a definitive diagnosis was not made. Pt reports having seizure aroudn 2021   Past Surgical History:  Procedure Laterality Date   ARM WOUND REPAIR / CLOSURE Right    fractured arm   BREAST IMPLANT REMOVAL Left 12/30/2021   Procedure: REMOVAL LEFT CHEST TISSUE EXPANDER;  Surgeon: Glenna Fellows, MD;  Location: [facility];  Service: Plastics;  Laterality: Left;   BREAST RECONSTRUCTION WITH PLACEMENT OF TISSUE EXPANDER AND ALLODERM Left 08/18/2022   Procedure: BREAST RECONSTRUCTION WITH PLACEMENT OF TISSUE EXPANDER AND  ALLODERM;  Surgeon: Glenna Fellows, MD;  Location: [facility];  Service: Plastics;  Laterality: Left;   BREAST RECONSTRUCTION WITH PLACEMENT OF TISSUE EXPANDER AND ALLODERM Right 11/21/2022   Procedure: PLACEMENT OF ALLODERM;  Surgeon: Glenna Fellows, MD;  Location: [facility];  Service: Plastics;  Laterality: Right;   BREAST RECONSTRUCTION WITH PLACEMENT OF TISSUE EXPANDER AND FLEX HD (ACELLULAR HYDRATED DERMIS) Left 06/15/2021   Procedure: REPLACEMENT OF TISSUE EXPANDER AND FLEX HD (ACELLULAR HYDRATED DERMIS);   Surgeon: Peggye Form, DO;  Location: [facility];  Service: Plastics;  Laterality: Left;   CESAREAN SECTION     DEBRIDEMENT AND CLOSURE WOUND Left 06/15/2021   Procedure: DEBRIDEMENT AND CLOSURE LEFT BREAST WOUND;  Surgeon: Peggye Form, DO;  Location: [facility];  Service: Plastics;  Laterality: Left;  1 hour   LATISSIMUS FLAP TO BREAST Left 07/18/2021   Procedure: LATISSIMUS FLAP TO BREAST;  Surgeon: Peggye Form, DO;  Location: [facility];  Service: Plastics;  Laterality: Left;   NIPPLE SPARING MASTECTOMY Right 05/12/2021   Procedure: RIGHT NIPPLE SPARING MASTECTOMY;  Surgeon: Griselda Miner, MD;  Location: [facility];  Service: General;  Laterality: Right;   NIPPLE SPARING MASTECTOMY WITH SENTINEL LYMPH NODE BIOPSY Left 05/12/2021   Procedure: LEFT NIPPLE SPARING MASTECTOMY WITH LEFT SENTINEL LYMPH NODE BIOPSY;  Surgeon: Griselda Miner, MD;  Location: [facility];  Service: General;  Laterality: Left;   PRE-PECTORAL BREAST RECON W/ PLACEMENT OF TISSUE EXPANDERS, DERMAL GRAFT Bilateral 05/12/2021   Procedure: IMMEDIATE BILATERAL BREAST RECONSTRUCTION WITH PLACEMENT OF TISSUE EXPANDERS AND FLEX HD;  Surgeon: Peggye Form, DO;  Location: [facility];  Service: Plastics;  Laterality: Bilateral;   REMOVAL OF BILATERAL TISSUE EXPANDERS WITH PLACEMENT OF BILATERAL BREAST IMPLANTS Bilateral 11/21/2022   Procedure: REMOVAL OF BILATERAL TISSUE EXPANDERS WITH PLACEMENT OF SILICONE IMPLANTS;  Surgeon: Glenna Fellows, MD;  Location: [facility];  Service: Plastics;  Laterality: Bilateral;   REMOVAL OF TISSUE EXPANDER Left 10/27/2021   Procedure: Left breast seroma drainage with tissue expander exchange;  Surgeon: Peggye Form, DO;  Location: [facility];  Service: Plastics;  Laterality: Left;   TISSUE EXPANDER PLACEMENT Left 07/18/2021   Procedure: REMOVAL AND REPLACEMENT OF TISSUE EXPANDER;  Surgeon: Peggye Form, DO;  Location:  [facility];  Service: Plastics;  Laterality: Left;  3 hours   WOUND EXPLORATION Left 08/18/2022   Procedure: PERIPHERAL NERVE DIVISION;  Surgeon: Glenna Fellows, MD;  Location: [facility];  Service: Plastics;  Laterality: Left;   Patient Active Problem List   Diagnosis Date Noted   History of breast cancer 12/30/2021   Admission for removal of tissue expander of breast, unspecified laterality 07/18/2021   Acquired absence of left breast 07/18/2021   Open wound of left breast 07/15/2021   Breast cancer (HCC) 05/12/2021   Genetic testing 03/18/2021   Family history of pancreatic cancer 03/10/2021   Malignant neoplasm of upper-outer quadrant of left breast in female, estrogen receptor positive (HCC) 03/07/2021   Anxiety 06/25/2011    REFERRING DIAG: left breast cancer at risk for lymphedema  THERAPY DIAG:  Aftercare following surgery for neoplasm  PERTINENT HISTORY: Bilateral nipple sparing mastectomy and left sentinel node biopsy (3 negative nodes) on 05/12/2021 with expanders placed for reconstruction. It is ER/PR positive and HER2 negative with a Ki67 of 1%. Muscle flap reconstruction on left performed 07/19/21 due to complications. 10/27/2021 Left tissue expander exchange with seroma drainage. 12/30/2021 - left tissue expander removed.   PRECAUTIONS: left UE Lymphedema risk, None  SUBJECTIVE: Pt returns for her 3 month L-dex screen. "I'm moving to [facility]."  PAIN:  Are you having pain? No  SOZO SCREENING: Patient was assessed today using the SOZO machine to determine the lymphedema index score. This was compared to her baseline score. It was determined that she is within the recommended range when compared to her baseline and no further action is needed at this time. She will continue SOZO screenings. These are done every 3 months for 2 years post operatively followed by every 6 months for 2 years, and then annually.   L-DEX FLOWSHEETS - 12/25/22 1600       L-DEX LYMPHEDEMA  SCREENING   Measurement Type Unilateral    L-DEX MEASUREMENT EXTREMITY Upper Extremity    POSITION  Standing    DOMINANT SIDE Right    At Risk Side Left    BASELINE SCORE (UNILATERAL) -0.7    L-DEX SCORE (UNILATERAL) 2.7    VALUE CHANGE (UNILAT) 3.4                Hermenia Bers, PTA 12/25/2022, 4:39 PM

## 2023-02-02 ENCOUNTER — Encounter: Payer: No Typology Code available for payment source | Admitting: Physical Medicine & Rehabilitation

## 2023-02-15 NOTE — Progress Notes (Signed)
Cancelled appointment

## 2023-02-16 ENCOUNTER — Encounter: Payer: Self-pay | Admitting: Physical Medicine & Rehabilitation

## 2023-02-17 MED ORDER — TIZANIDINE HCL 2 MG PO CAPS: 2.0000 mg | ORAL_CAPSULE | Freq: Three times a day (TID) | ORAL | 1 refills | Status: AC | PRN

## 2023-02-17 MED ORDER — MELOXICAM 7.5 MG PO TABS: 7.5000 mg | ORAL_TABLET | Freq: Every day | ORAL | 1 refills | Status: AC

## 2023-02-26 MED ORDER — OXYCODONE-ACETAMINOPHEN 5-325 MG PO TABS: 1.0000 | ORAL_TABLET | Freq: Two times a day (BID) | ORAL | 0 refills | Status: AC | PRN

## 2023-03-01 ENCOUNTER — Encounter: Payer: Self-pay | Admitting: *Deleted

## 2023-03-01 ENCOUNTER — Encounter: Payer: Self-pay | Admitting: Hematology and Oncology

## 2023-03-01 NOTE — Progress Notes (Signed)
 Received message from pt stating she has moved to [place] and is requesting referral be sent to [facility].  RN successfully faxed referral to [phone].

## 2023-03-30 ENCOUNTER — Encounter: Payer: No Typology Code available for payment source | Admitting: Physical Medicine & Rehabilitation
